# Patient Record
Sex: Female | Born: 1955 | Race: White | Hispanic: No | Smoking: Former smoker
Health system: Southern US, Community
[De-identification: ages and names within clinical notes are randomized; demographics above are authoritative.]

## PROBLEM LIST (undated history)

## (undated) DIAGNOSIS — F419 Anxiety disorder, unspecified: Secondary | ICD-10-CM

## (undated) DIAGNOSIS — F32A Depression, unspecified: Secondary | ICD-10-CM

## (undated) DIAGNOSIS — G8929 Other chronic pain: Secondary | ICD-10-CM

## (undated) DIAGNOSIS — I10 Essential (primary) hypertension: Secondary | ICD-10-CM

## (undated) DIAGNOSIS — Z87442 Personal history of urinary calculi: Secondary | ICD-10-CM

## (undated) DIAGNOSIS — M1990 Unspecified osteoarthritis, unspecified site: Secondary | ICD-10-CM

## (undated) DIAGNOSIS — R519 Headache, unspecified: Secondary | ICD-10-CM

## (undated) DIAGNOSIS — J45909 Unspecified asthma, uncomplicated: Secondary | ICD-10-CM

## (undated) DIAGNOSIS — B1920 Unspecified viral hepatitis C without hepatic coma: Secondary | ICD-10-CM

## (undated) DIAGNOSIS — F329 Major depressive disorder, single episode, unspecified: Secondary | ICD-10-CM

## (undated) HISTORY — PX: OTHER SURGICAL HISTORY: SHX169

## (undated) HISTORY — DX: Unspecified asthma, uncomplicated: J45.909

## (undated) HISTORY — DX: Headache, unspecified: R51.9

## (undated) HISTORY — DX: Unspecified viral hepatitis C without hepatic coma: B19.20

## (undated) HISTORY — DX: Other chronic pain: G89.29

---
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
## 1993-04-13 HISTORY — PX: ABDOMINAL HYSTERECTOMY: SHX81

## 1998-04-13 HISTORY — PX: TONSILLECTOMY: SUR1361

## 2000-03-11 ENCOUNTER — Encounter: Payer: Self-pay | Admitting: Internal Medicine

## 2001-11-11 ENCOUNTER — Ambulatory Visit (HOSPITAL_COMMUNITY): Admission: RE | Admit: 2001-11-11 | Discharge: 2001-11-11 | Payer: Self-pay | Admitting: Pulmonary Disease

## 2002-04-10 ENCOUNTER — Ambulatory Visit (HOSPITAL_COMMUNITY): Admission: RE | Admit: 2002-04-10 | Discharge: 2002-04-10 | Payer: Self-pay | Admitting: Pulmonary Disease

## 2004-09-04 ENCOUNTER — Ambulatory Visit (HOSPITAL_COMMUNITY): Admission: RE | Admit: 2004-09-04 | Discharge: 2004-09-04 | Payer: Self-pay | Admitting: Pulmonary Disease

## 2004-11-03 ENCOUNTER — Ambulatory Visit (HOSPITAL_COMMUNITY): Admission: RE | Admit: 2004-11-03 | Discharge: 2004-11-03 | Payer: Self-pay | Admitting: Pulmonary Disease

## 2007-02-02 ENCOUNTER — Ambulatory Visit (HOSPITAL_COMMUNITY): Admission: RE | Admit: 2007-02-02 | Discharge: 2007-02-02 | Payer: Self-pay | Admitting: Urology

## 2007-02-03 ENCOUNTER — Ambulatory Visit (HOSPITAL_COMMUNITY): Admission: RE | Admit: 2007-02-03 | Discharge: 2007-02-03 | Payer: Self-pay | Admitting: Urology

## 2009-05-13 ENCOUNTER — Ambulatory Visit (HOSPITAL_COMMUNITY): Admission: RE | Admit: 2009-05-13 | Discharge: 2009-05-13 | Payer: Self-pay | Admitting: Urology

## 2009-06-12 ENCOUNTER — Ambulatory Visit (HOSPITAL_COMMUNITY): Admission: RE | Admit: 2009-06-12 | Discharge: 2009-06-12 | Payer: Self-pay | Admitting: Urology

## 2009-08-08 ENCOUNTER — Ambulatory Visit (HOSPITAL_COMMUNITY): Admission: RE | Admit: 2009-08-08 | Discharge: 2009-08-08 | Payer: Self-pay | Admitting: Urology

## 2010-06-04 ENCOUNTER — Other Ambulatory Visit (HOSPITAL_COMMUNITY): Payer: Self-pay | Admitting: Orthopaedic Surgery

## 2010-06-04 DIAGNOSIS — M542 Cervicalgia: Secondary | ICD-10-CM

## 2010-06-04 DIAGNOSIS — M5410 Radiculopathy, site unspecified: Secondary | ICD-10-CM

## 2010-06-06 ENCOUNTER — Ambulatory Visit (HOSPITAL_COMMUNITY)
Admission: RE | Admit: 2010-06-06 | Discharge: 2010-06-06 | Disposition: A | Discharge status: Home / Self Care | Payer: BC Managed Care – PPO | Source: Ambulatory Visit | Attending: Orthopaedic Surgery | Admitting: Orthopaedic Surgery

## 2010-06-06 DIAGNOSIS — M5410 Radiculopathy, site unspecified: Secondary | ICD-10-CM

## 2010-06-06 DIAGNOSIS — M4802 Spinal stenosis, cervical region: Secondary | ICD-10-CM | POA: Insufficient documentation

## 2010-06-06 DIAGNOSIS — M5020 Other cervical disc displacement, unspecified cervical region: Secondary | ICD-10-CM | POA: Insufficient documentation

## 2010-06-06 DIAGNOSIS — M542 Cervicalgia: Secondary | ICD-10-CM

## 2010-06-06 DIAGNOSIS — R209 Unspecified disturbances of skin sensation: Secondary | ICD-10-CM | POA: Insufficient documentation

## 2010-06-06 DIAGNOSIS — R609 Edema, unspecified: Secondary | ICD-10-CM | POA: Insufficient documentation

## 2010-06-06 DIAGNOSIS — M5412 Radiculopathy, cervical region: Secondary | ICD-10-CM | POA: Insufficient documentation

## 2010-06-06 DIAGNOSIS — M47812 Spondylosis without myelopathy or radiculopathy, cervical region: Secondary | ICD-10-CM | POA: Insufficient documentation

## 2010-06-06 DIAGNOSIS — M79609 Pain in unspecified limb: Secondary | ICD-10-CM | POA: Insufficient documentation

## 2010-08-26 NOTE — H&P (Signed)
NAME:  Amber Peterson, Amber Peterson                ACCOUNT NO.:  [id number]   MEDICAL RECORD NO.:  [id number]          PATIENT TYPE:  AMB   LOCATION:  DAY                           FACILITY:  [facility]   PHYSICIAN:  Dennie Maizes, M.D.   DATE OF BIRTH:  03-26-1956   DATE OF ADMISSION:  02/02/2007  DATE OF DISCHARGE:  [facility]                              HISTORY & PHYSICAL   CHIEF COMPLAINT:  Left flank pain, nausea and vomiting, left renal  calculus.   HISTORY OF PRESENT ILLNESS:  This 55 year old female has a past history  of recurrent urolithiasis.  The patient has undergone lithotripsy of  kidney stone about 2 years ago.   She experienced significant left flank and left quadrant abdominal pain  as well as nausea and vomiting about a week ago.  She went to the  emergency room at [facility].  Evaluation was done with  a noncontrast CT scan of the abdomen and pelvis, which revealed multiple  small right renal calculi, without obstruction.  She had 2 stones in the  left  renal pelvis.  The patient is unable to pass the stones.  She  continues to have pain.  She is brought to the short stay center today  for extracorporeal shock wave lithotripsy to the left renal calculus.   PAST MEDICAL HISTORY:  1. Left renal lithiasis, status post ESL.  2. History of bronchial asthma.  3. Status post hysterectomy.  4. Status post tonsillectomy and adenoidectomy.   MEDICATIONS:  Advair Diskus inhaler, Albuterol inhaler, Xanax, Proventil  inhaler, Effexor.   ALLERGIES:  1. SULFA.  2. CODEINE.  3. PENICILLIN.  4. ASPIRIN.   PHYSICAL EXAMINATION:  HEENT:  Normal.  NECK:  No masses.  LUNGS:  Clear to auscultation.  HEART:  Regular rate and rhythm.  No murmurs.  ABDOMEN:  Soft.  No palpable flank mass or CVA tenderness.  Bladder is  not palpable. No suprapubic tenderness.   IMPRESSION:  Left renal calculi with obstruction, left flank pain.   PLAN:  ESL of left renal calculi x2, with IV sedation in  short stay  center.  I have discussed with the patient regarding the diagnosis,  operative details, alternative treatments, the outcome, possible risks,  and complications, and she has agreed for the procedure to be done.      Dennie Maizes, M.D.  Electronically Signed     SK/MEDQ  D:  02/02/2007  T:  02/02/2007  Job:  [id number]   cc:   [facility]  Fax: [phone]   Oneal Deputy. Juanetta Gosling, M.D.  Fax: [phone]

## 2015-07-23 ENCOUNTER — Other Ambulatory Visit (HOSPITAL_COMMUNITY): Payer: Self-pay | Admitting: Pulmonary Disease

## 2015-07-23 DIAGNOSIS — M542 Cervicalgia: Secondary | ICD-10-CM

## 2015-07-26 ENCOUNTER — Ambulatory Visit (HOSPITAL_COMMUNITY): Payer: BLUE CROSS/BLUE SHIELD

## 2015-07-29 ENCOUNTER — Ambulatory Visit (HOSPITAL_COMMUNITY)
Admission: RE | Admit: 2015-07-29 | Discharge: 2015-07-29 | Disposition: A | Discharge status: Home / Self Care | Payer: BLUE CROSS/BLUE SHIELD | Source: Ambulatory Visit | Attending: Pulmonary Disease | Admitting: Pulmonary Disease

## 2015-07-29 DIAGNOSIS — M4802 Spinal stenosis, cervical region: Secondary | ICD-10-CM | POA: Diagnosis not present

## 2015-07-29 DIAGNOSIS — M542 Cervicalgia: Secondary | ICD-10-CM | POA: Diagnosis present

## 2015-08-02 ENCOUNTER — Other Ambulatory Visit: Payer: Self-pay | Admitting: Pulmonary Disease

## 2015-08-02 DIAGNOSIS — G8929 Other chronic pain: Secondary | ICD-10-CM

## 2015-08-02 DIAGNOSIS — M542 Cervicalgia: Principal | ICD-10-CM

## 2015-08-09 ENCOUNTER — Ambulatory Visit
Admission: RE | Admit: 2015-08-09 | Discharge: 2015-08-09 | Disposition: A | Discharge status: Home / Self Care | Payer: BLUE CROSS/BLUE SHIELD | Source: Ambulatory Visit | Attending: Pulmonary Disease | Admitting: Pulmonary Disease

## 2015-08-09 DIAGNOSIS — M542 Cervicalgia: Principal | ICD-10-CM

## 2015-08-09 DIAGNOSIS — G8929 Other chronic pain: Secondary | ICD-10-CM

## 2015-08-09 MED ORDER — IOHEXOL 300 MG/ML  SOLN
1.0000 mL | Freq: Once | INTRAMUSCULAR | Status: AC | PRN
  Administered 2015-08-09: 1 mL via EPIDURAL

## 2015-08-09 MED ORDER — TRIAMCINOLONE ACETONIDE 40 MG/ML IJ SUSP (RADIOLOGY)
60.0000 mg | Freq: Once | INTRAMUSCULAR | Status: AC
  Administered 2015-08-09: 60 mg via EPIDURAL

## 2015-08-09 NOTE — Discharge Instructions (Signed)

## 2016-05-26 ENCOUNTER — Ambulatory Visit (HOSPITAL_COMMUNITY)
Admission: RE | Admit: 2016-05-26 | Discharge: 2016-05-26 | Disposition: A | Discharge status: Home / Self Care | Payer: BLUE CROSS/BLUE SHIELD | Source: Ambulatory Visit | Attending: Pulmonary Disease | Admitting: Pulmonary Disease

## 2016-05-26 ENCOUNTER — Other Ambulatory Visit (HOSPITAL_COMMUNITY): Payer: Self-pay | Admitting: Pulmonary Disease

## 2016-05-26 DIAGNOSIS — M545 Low back pain: Secondary | ICD-10-CM | POA: Insufficient documentation

## 2016-05-26 DIAGNOSIS — R52 Pain, unspecified: Secondary | ICD-10-CM

## 2016-06-09 ENCOUNTER — Other Ambulatory Visit (HOSPITAL_COMMUNITY): Payer: Self-pay | Admitting: Pulmonary Disease

## 2016-06-09 DIAGNOSIS — I700 Atherosclerosis of aorta: Secondary | ICD-10-CM

## 2016-06-18 ENCOUNTER — Ambulatory Visit (HOSPITAL_COMMUNITY)
Admission: RE | Admit: 2016-06-18 | Discharge: 2016-06-18 | Disposition: A | Discharge status: Home / Self Care | Payer: BLUE CROSS/BLUE SHIELD | Source: Ambulatory Visit | Attending: Pulmonary Disease | Admitting: Pulmonary Disease

## 2016-06-18 DIAGNOSIS — I700 Atherosclerosis of aorta: Secondary | ICD-10-CM | POA: Diagnosis present

## 2017-07-22 ENCOUNTER — Ambulatory Visit (INDEPENDENT_AMBULATORY_CARE_PROVIDER_SITE_OTHER): Payer: BLUE CROSS/BLUE SHIELD | Admitting: Internal Medicine

## 2017-07-22 ENCOUNTER — Encounter (INDEPENDENT_AMBULATORY_CARE_PROVIDER_SITE_OTHER): Payer: Self-pay | Admitting: Internal Medicine

## 2017-07-22 ENCOUNTER — Encounter (INDEPENDENT_AMBULATORY_CARE_PROVIDER_SITE_OTHER): Payer: Self-pay | Admitting: *Deleted

## 2017-07-22 VITALS — BP 140/90 | HR 68 | Temp 98.1°F | Ht 59.0 in | Wt 147.7 lb

## 2017-07-22 DIAGNOSIS — B1920 Unspecified viral hepatitis C without hepatic coma: Secondary | ICD-10-CM

## 2017-07-22 DIAGNOSIS — J45909 Unspecified asthma, uncomplicated: Secondary | ICD-10-CM | POA: Insufficient documentation

## 2017-07-22 DIAGNOSIS — B1710 Acute hepatitis C without hepatic coma: Secondary | ICD-10-CM

## 2017-07-22 HISTORY — DX: Unspecified viral hepatitis C without hepatic coma: B19.20

## 2017-07-22 HISTORY — DX: Unspecified asthma, uncomplicated: J45.909

## 2017-07-22 NOTE — Progress Notes (Addendum)
   Subjective:    Patient ID: Amber Peterson, female    DOB: 1956/01/06, 62 y.o.   MRN: [id number]  HPI Referred by Dr. Luan Pulling for Hepatitis C. She says she was diagnosed years ago by Dr. Gala Romney. She was not treated at that time. No IV drug use. No blood transfusion. She does not have any tattoos. She denies prior hx of jaundice.  Has received Hepatitis B vaccine years ago. She denies any risk factors for Hepatitis C.  Her appetite is good. No weight loss. Has a BM x 2 a day. Underwent a liver biopsy in 2001 for Hepatitis C which revealed changes consistent with Hepatitis C infection.     06/29/2017 [place] quaint [id number].   Review of Systems Past Medical History:  Diagnosis Date  . Asthma 07/22/2017  . Hepatitis C 07/22/2017      Allergies  Allergen Reactions  . Penicillins     Hives,itching  . Sulfa Antibiotics     Hives, itching    Current Outpatient Medications on File Prior to Visit  Medication Sig Dispense Refill  . albuterol (PROVENTIL) (2.5 MG/3ML) 0.083% nebulizer solution Take 2.5 mg by nebulization every 6 (six) hours as needed for wheezing or shortness of breath.    . ALPRAZolam (XANAX) 0.5 MG tablet Take 0.5 mg by mouth at bedtime as needed for anxiety.    . budesonide-formoterol (SYMBICORT) 160-4.5 MCG/ACT inhaler Inhale 2 puffs into the lungs 2 (two) times daily.    Marland Kitchen HYDROcodone-acetaminophen (NORCO) 7.5-325 MG tablet Take 1 tablet by mouth every 6 (six) hours as needed for moderate pain.    . magnesium 30 MG tablet Take by mouth.    . montelukast (SINGULAIR) 10 MG tablet Take 10 mg by mouth at bedtime.    . nebivolol (BYSTOLIC) 10 MG tablet Take 10 mg by mouth daily.    Marland Kitchen venlafaxine (EFFEXOR) 50 MG tablet Take 50 mg by mouth 2 (two) times daily.     No current facility-administered medications on file prior to visit.         Objective:   Physical Exam  Blood pressure 140/90, pulse 68, temperature 98.1 F (36.7 C), height 5\' 9"  (1.753 m), weight 147 lb  11.2 oz (67 kg). Alert and oriented. Skin warm and dry. Oral mucosa is moist.   . Sclera anicteric, conjunctivae is pink. Thyroid not enlarged. No cervical lymphadenopathy. Lungs clear. Heart regular rate and rhythm.  Abdomen is soft. Bowel sounds are positive. No hepatomegaly. No abdominal masses felt. No tenderness.  No edema to lower extremities.          Assessment & Plan:  Hepatitis C. Am going to get a CBC, Heptic, PT/INR, AFP, Acute hepatitis panel, HIV, Hep C genotype, Urine drug screen. US abdomen elast.

## 2017-07-22 NOTE — Patient Instructions (Signed)
Labs today    Further recommendations to follow

## 2017-07-27 ENCOUNTER — Other Ambulatory Visit (INDEPENDENT_AMBULATORY_CARE_PROVIDER_SITE_OTHER): Payer: Self-pay | Admitting: Internal Medicine

## 2017-07-27 ENCOUNTER — Ambulatory Visit (HOSPITAL_COMMUNITY)
Admission: RE | Admit: 2017-07-27 | Discharge: 2017-07-27 | Disposition: A | Discharge status: Home / Self Care | Payer: BLUE CROSS/BLUE SHIELD | Source: Ambulatory Visit | Attending: Internal Medicine | Admitting: Internal Medicine

## 2017-07-27 ENCOUNTER — Telehealth (INDEPENDENT_AMBULATORY_CARE_PROVIDER_SITE_OTHER): Payer: Self-pay | Admitting: Internal Medicine

## 2017-07-27 DIAGNOSIS — K8020 Calculus of gallbladder without cholecystitis without obstruction: Secondary | ICD-10-CM | POA: Insufficient documentation

## 2017-07-27 DIAGNOSIS — B1710 Acute hepatitis C without hepatic coma: Secondary | ICD-10-CM | POA: Insufficient documentation

## 2017-07-27 DIAGNOSIS — R161 Splenomegaly, not elsewhere classified: Secondary | ICD-10-CM | POA: Diagnosis not present

## 2017-07-27 LAB — HEPATIC FUNCTION PANEL
AG Ratio: 1.2 (calc) (ref 1.0–2.5)
ALT: 71 U/L — ABNORMAL HIGH (ref 6–29)
AST: 54 U/L — ABNORMAL HIGH (ref 10–35)
Albumin: 4.3 g/dL (ref 3.6–5.1)
Alkaline phosphatase (APISO): 72 U/L (ref 33–130)
Bilirubin, Direct: 0.3 mg/dL — ABNORMAL HIGH (ref 0.0–0.2)
Globulin: 3.5 g/dL (ref 1.9–3.7)
Indirect Bilirubin: 0.9 mg/dL (ref 0.2–1.2)
Total Bilirubin: 1.2 mg/dL (ref 0.2–1.2)
Total Protein: 7.8 g/dL (ref 6.1–8.1)

## 2017-07-27 LAB — DRUGS OF ABUSE SCREEN W/O ALC, ROUTINE URINE
AMPHETAMINES (1000 ng/mL SCRN): NEGATIVE
BARBITURATES: NEGATIVE
BENZODIAZEPINES: POSITIVE — AB
COCAINE METABOLITES: NEGATIVE
HYDROCODONE: POSITIVE — AB
HYDROMORPHONE: POSITIVE — AB
MARIJUANA MET (50 ng/mL SCRN): NEGATIVE
METHADONE: NEGATIVE
METHAQUALONE: NEGATIVE
OPIATES: POSITIVE — AB
PHENCYCLIDINE: NEGATIVE
PROPOXYPHENE: NEGATIVE

## 2017-07-27 LAB — CBC WITH DIFFERENTIAL/PLATELET
Basophils Absolute: 40 {cells}/uL (ref 0–200)
Basophils Relative: 0.6 %
Eosinophils Absolute: 147 {cells}/uL (ref 15–500)
Eosinophils Relative: 2.2 %
HCT: 42.2 % (ref 35.0–45.0)
Hemoglobin: 15 g/dL (ref 11.7–15.5)
Lymphs Abs: 2057 {cells}/uL (ref 850–3900)
MCH: 35 pg — ABNORMAL HIGH (ref 27.0–33.0)
MCHC: 35.5 g/dL (ref 32.0–36.0)
MCV: 98.4 fL (ref 80.0–100.0)
MPV: 11.3 fL (ref 7.5–12.5)
Monocytes Relative: 8.7 %
Neutro Abs: 3873 {cells}/uL (ref 1500–7800)
Neutrophils Relative %: 57.8 %
Platelets: 94 Thousand/uL — ABNORMAL LOW (ref 140–400)
RBC: 4.29 Million/uL (ref 3.80–5.10)
RDW: 13.1 % (ref 11.0–15.0)
Total Lymphocyte: 30.7 %
WBC mixed population: 583 {cells}/uL (ref 200–950)
WBC: 6.7 Thousand/uL (ref 3.8–10.8)

## 2017-07-27 LAB — HIV ANTIBODY (ROUTINE TESTING W REFLEX): HIV: NONREACTIVE

## 2017-07-27 LAB — HEPATITIS PANEL, ACUTE
HEP B C IGM: NONREACTIVE
Hep A IgM: NONREACTIVE
Hepatitis B Surface Ag: NONREACTIVE
Hepatitis C Ab: REACTIVE — AB
SIGNAL TO CUT-OFF: 25 — ABNORMAL HIGH (ref <1.00)

## 2017-07-27 LAB — HCV RNA,QUANTITATIVE REAL TIME PCR
HCV Quantitative Log: 6.45 {Log_IU}/mL — ABNORMAL HIGH
HCV RNA, PCR, QN: 2840000 [IU]/mL — ABNORMAL HIGH

## 2017-07-27 LAB — AFP TUMOR MARKER: AFP-Tumor Marker: 4.1 ng/mL

## 2017-07-27 LAB — PROTIME-INR
INR: 1.1
Prothrombin Time: 11.3 s (ref 9.0–11.5)

## 2017-07-27 LAB — HEPATITIS C GENOTYPE

## 2017-07-27 NOTE — Telephone Encounter (Signed)
Results have been given to patient. Repeat Urine drug screen

## 2017-07-31 LAB — DRUGS OF ABUSE SCREEN W/O ALC, ROUTINE URINE
AMPHETAMINES (1000 NG/ML SCRN): NEGATIVE
BARBITURATES: NEGATIVE
BENZODIAZEPINES: POSITIVE — AB
COCAINE METABOLITES: NEGATIVE
HYDROCODONE: POSITIVE — AB
HYDROMORPHONE: POSITIVE — AB
MARIJUANA MET (50 NG/ML SCRN): NEGATIVE
METHADONE: NEGATIVE
METHAQUALONE: NEGATIVE
OPIATES: POSITIVE — AB
PHENCYCLIDINE: NEGATIVE
PROPOXYPHENE: NEGATIVE

## 2017-08-03 ENCOUNTER — Telehealth (INDEPENDENT_AMBULATORY_CARE_PROVIDER_SITE_OTHER): Payer: Self-pay | Admitting: Internal Medicine

## 2017-08-03 NOTE — Telephone Encounter (Signed)
Amber Peterson, [place]avy[place] x 8 weeks.  

## 2017-08-05 NOTE — Telephone Encounter (Signed)
The PA process has been sent to [employer] ,once we have rec'd approval from them we will contact the patient.

## 2017-08-06 ENCOUNTER — Telehealth (INDEPENDENT_AMBULATORY_CARE_PROVIDER_SITE_OTHER): Payer: Self-pay | Admitting: Internal Medicine

## 2017-08-06 NOTE — Telephone Encounter (Signed)
I am not sure what this would be. We have started the PA process for her Hep C treatment. Forwarded to Terri to address on 08/09/2017.

## 2017-08-06 NOTE — Telephone Encounter (Signed)
Patient called back, stated the medication is [employer]

## 2017-08-06 NOTE — Telephone Encounter (Signed)
Patient left message stating Amber Peterson was to refill a medication - stated she called pharmacy and they had not received anything.

## 2017-08-09 ENCOUNTER — Telehealth (INDEPENDENT_AMBULATORY_CARE_PROVIDER_SITE_OTHER): Payer: Self-pay | Admitting: Internal Medicine

## 2017-08-09 NOTE — Telephone Encounter (Signed)
I have spoken with patient 

## 2017-08-09 NOTE — Telephone Encounter (Signed)
Advised her that it will take a couple of weeks for the medication to come in and that we would call her.

## 2017-08-09 NOTE — Telephone Encounter (Signed)
Patient would like for you to call her at [phone]

## 2017-08-16 ENCOUNTER — Telehealth (INDEPENDENT_AMBULATORY_CARE_PROVIDER_SITE_OTHER): Payer: Self-pay | Admitting: Internal Medicine

## 2017-08-16 NOTE — Telephone Encounter (Signed)
Her insurance will pay for [employer]

## 2017-08-16 NOTE — Telephone Encounter (Signed)
Patient called stated [facility] - denied her medication - please call her at [phone]

## 2017-08-16 NOTE — Telephone Encounter (Signed)
A Rx for Harvoni has been called to [facility]. Patient is to take 1 a day for 12 weeks. #28 with 2 refills. Patient was called and message was left for the patient.

## 2017-08-26 ENCOUNTER — Telehealth (INDEPENDENT_AMBULATORY_CARE_PROVIDER_SITE_OTHER): Payer: Self-pay | Admitting: Internal Medicine

## 2017-08-26 NOTE — Telephone Encounter (Signed)
Patient called left message wanting to know about her treatment - please call [phone]

## 2017-08-26 NOTE — Telephone Encounter (Signed)
Left message telling her we would call her once we have her medication approved.

## 2017-09-07 ENCOUNTER — Telehealth (INDEPENDENT_AMBULATORY_CARE_PROVIDER_SITE_OTHER): Payer: Self-pay | Admitting: Internal Medicine

## 2017-09-07 DIAGNOSIS — B1920 Unspecified viral hepatitis C without hepatic coma: Secondary | ICD-10-CM

## 2017-09-07 MED ORDER — LEDIPASVIR-SOFOSBUVIR 90-400 MG PO TABS: 1.0000 | ORAL_TABLET | Freq: Every day | ORAL | 3 refills | Status: DC

## 2017-09-07 NOTE — Telephone Encounter (Signed)
Rx for [employer]

## 2017-09-29 ENCOUNTER — Telehealth (INDEPENDENT_AMBULATORY_CARE_PROVIDER_SITE_OTHER): Payer: Self-pay | Admitting: *Deleted

## 2017-09-29 NOTE — Telephone Encounter (Signed)
Patient was called and [place] before she can pick up the medication.

## 2017-10-06 ENCOUNTER — Encounter (INDEPENDENT_AMBULATORY_CARE_PROVIDER_SITE_OTHER): Payer: Self-pay | Admitting: *Deleted

## 2017-10-06 ENCOUNTER — Other Ambulatory Visit (INDEPENDENT_AMBULATORY_CARE_PROVIDER_SITE_OTHER): Payer: Self-pay | Admitting: *Deleted

## 2017-10-06 ENCOUNTER — Telehealth (INDEPENDENT_AMBULATORY_CARE_PROVIDER_SITE_OTHER): Payer: Self-pay | Admitting: Internal Medicine

## 2017-10-06 DIAGNOSIS — B1920 Unspecified viral hepatitis C without hepatic coma: Secondary | ICD-10-CM

## 2017-10-06 NOTE — Telephone Encounter (Signed)
Needs CBC, Hepatic and Hep C quaint in 4 weeks. Please send letter. Thanks

## 2017-10-06 NOTE — Telephone Encounter (Signed)
Labs are noted and a letter has been sent.

## 2017-10-21 ENCOUNTER — Telehealth (INDEPENDENT_AMBULATORY_CARE_PROVIDER_SITE_OTHER): Payer: Self-pay | Admitting: Internal Medicine

## 2017-10-21 LAB — CBC
HEMATOCRIT: 43.8 % (ref 35.0–45.0)
Hemoglobin: 15.5 g/dL (ref 11.7–15.5)
MCH: 34.8 pg — AB (ref 27.0–33.0)
MCHC: 35.4 g/dL (ref 32.0–36.0)
MCV: 98.2 fL (ref 80.0–100.0)
MPV: 11 fL (ref 7.5–12.5)
PLATELETS: 111 10*3/uL — AB (ref 140–400)
RBC: 4.46 10*6/uL (ref 3.80–5.10)
RDW: 13.1 % (ref 11.0–15.0)
WBC: 8.3 10*3/uL (ref 3.8–10.8)

## 2017-10-21 LAB — HEPATIC FUNCTION PANEL
AG Ratio: 1.1 (calc) (ref 1.0–2.5)
ALT: 21 U/L (ref 6–29)
AST: 25 U/L (ref 10–35)
Albumin: 4.4 g/dL (ref 3.6–5.1)
Alkaline phosphatase (APISO): 62 U/L (ref 33–130)
Bilirubin, Direct: 0.3 mg/dL — ABNORMAL HIGH (ref 0.0–0.2)
Globulin: 3.9 g/dL — ABNORMAL HIGH (ref 1.9–3.7)
Indirect Bilirubin: 1.2 mg/dL (ref 0.2–1.2)
Total Bilirubin: 1.5 mg/dL — ABNORMAL HIGH (ref 0.2–1.2)
Total Protein: 8.3 g/dL — ABNORMAL HIGH (ref 6.1–8.1)

## 2017-10-21 LAB — HEPATITIS C RNA QUANTITATIVE
HCV Quantitative Log: <1.18 Log IU/mL — AB
HCV RNA, PCR, QN: DETECTED [IU]/mL — AB

## 2017-10-21 NOTE — Telephone Encounter (Signed)
Hep C quaint in 2 weeks

## 2017-10-25 ENCOUNTER — Other Ambulatory Visit (INDEPENDENT_AMBULATORY_CARE_PROVIDER_SITE_OTHER): Payer: Self-pay | Admitting: *Deleted

## 2017-10-25 ENCOUNTER — Encounter (INDEPENDENT_AMBULATORY_CARE_PROVIDER_SITE_OTHER): Payer: Self-pay | Admitting: *Deleted

## 2017-10-25 DIAGNOSIS — B182 Chronic viral hepatitis C: Secondary | ICD-10-CM

## 2017-10-25 NOTE — Telephone Encounter (Signed)
Lab has been noted. Patient will be sent a message as a reminder.

## 2017-11-04 ENCOUNTER — Telehealth (INDEPENDENT_AMBULATORY_CARE_PROVIDER_SITE_OTHER): Payer: Self-pay | Admitting: Internal Medicine

## 2017-11-04 NOTE — Telephone Encounter (Signed)
Patient came by office before going to lab - patient stated her insurance needs authorization for next dose of medication

## 2017-11-05 NOTE — Telephone Encounter (Signed)
We have rec'd the PA from Amber Peterson. Once it is completed and we here a response from them , we will let the patient know their answer.

## 2017-11-05 NOTE — Telephone Encounter (Signed)
Will review with Raeanne Gathers.

## 2017-11-08 LAB — HEPATITIS C RNA QUANTITATIVE
HCV Quantitative Log: <1.18 Log IU/mL
HCV RNA, PCR, QN: <15 IU/mL

## 2017-11-24 ENCOUNTER — Telehealth (INDEPENDENT_AMBULATORY_CARE_PROVIDER_SITE_OTHER): Payer: Self-pay | Admitting: Internal Medicine

## 2017-11-24 NOTE — Telephone Encounter (Signed)
Patient called wanted to know about the remainder of her medication process - please call [phone]

## 2017-11-26 NOTE — Telephone Encounter (Signed)
Amber Peterson - This is the one they only approved for the 8 weeks of treatment , correct?

## 2017-11-29 ENCOUNTER — Ambulatory Visit (INDEPENDENT_AMBULATORY_CARE_PROVIDER_SITE_OTHER): Payer: BLUE CROSS/BLUE SHIELD | Admitting: Internal Medicine

## 2017-11-30 ENCOUNTER — Encounter (INDEPENDENT_AMBULATORY_CARE_PROVIDER_SITE_OTHER): Payer: Self-pay | Admitting: Internal Medicine

## 2017-11-30 ENCOUNTER — Ambulatory Visit (INDEPENDENT_AMBULATORY_CARE_PROVIDER_SITE_OTHER): Payer: BLUE CROSS/BLUE SHIELD | Admitting: Internal Medicine

## 2017-11-30 VITALS — BP 150/80 | HR 68 | Temp 98.1°F | Ht 59.0 in | Wt 149.4 lb

## 2017-11-30 DIAGNOSIS — B182 Chronic viral hepatitis C: Secondary | ICD-10-CM

## 2017-11-30 NOTE — Telephone Encounter (Signed)
Terri states this morning that the patient is coming in this morning to see her. She will advise the patient that Insurance only covered 8 weeks.

## 2017-11-30 NOTE — Patient Instructions (Addendum)
Hepatic function.  OV in 1 year.

## 2017-11-30 NOTE — Progress Notes (Signed)
Subjective:    Patient ID: Amber Peterson, female    DOB: 09-23-1955, 62 y.o.   MRN: [id number]  HPI Here today for f/u. Hx of Hepatitis C.  She cleared the virus 11/04/2017. Treat with Harvoni x 8 weeks. Had no side effects from the medication.  She tells me she is doing good. Her appetite is okay. She has gained 2 pounds from her last visit. She sees her daughter often. She is exercising with yoga and has a max machine. She exercises abot 3-4 times a week and walks.  She has no GI complaints.   07/28/18-19 [place] Elast: F3-F4 Genotype 1B.   CBC    Component Value Date/Time   WBC 8.3 10/19/2017 1601   RBC 4.46 10/19/2017 1601   HGB 15.5 10/19/2017 1601   HCT 43.8 10/19/2017 1601   PLT 111 (L) 10/19/2017 1601   MCV 98.2 10/19/2017 1601   MCH 34.8 (H) 10/19/2017 1601   MCHC 35.4 10/19/2017 1601   RDW 13.1 10/19/2017 1601   LYMPHSABS 2,057 07/22/2017 1023   EOSABS 147 07/22/2017 1023   BASOSABS 40 07/22/2017 1023   Hepatic Function Latest Ref Rng & Units 10/19/2017 07/22/2017  Total Protein 6.1 - 8.1 g/dL 8.3(H) 7.8  AST 10 - 35 U/L 25 54(H)  ALT 6 - 29 U/L 21 71(H)  Total Bilirubin 0.2 - 1.2 mg/dL 1.5(H) 1.2  Bilirubin, Direct 0.0 - 0.2 mg/dL 0.3(H) 0.3(H)    Review of Systems Past Medical History:  Diagnosis Date  . Asthma 07/22/2017  . Hepatitis C 07/22/2017      Allergies  Allergen Reactions  . Penicillins     Hives,itching  . Sulfa Antibiotics     Hives, itching    Current Outpatient Medications on File Prior to Visit  Medication Sig Dispense Refill  . albuterol (PROVENTIL) (2.5 MG/3ML) 0.083% nebulizer solution Take 2.5 mg by nebulization every 6 (six) hours as needed for wheezing or shortness of breath.    . ALPRAZolam (XANAX) 0.5 MG tablet Take 0.5 mg by mouth at bedtime as needed for anxiety.    . budesonide-formoterol (SYMBICORT) 160-4.5 MCG/ACT inhaler Inhale 2 puffs into the lungs 2 (two) times daily.    Marland Kitchen HYDROcodone-acetaminophen (NORCO) 7.5-325 MG  tablet Take 1 tablet by mouth every 6 (six) hours as needed for moderate pain.    . Ledipasvir-Sofosbuvir (HARVONI) 90-400 MG TABS Take 1 tablet by mouth daily. 30 tablet 3  . magnesium 30 MG tablet Take by mouth.    . montelukast (SINGULAIR) 10 MG tablet Take 10 mg by mouth at bedtime.    . nebivolol (BYSTOLIC) 10 MG tablet Take 10 mg by mouth daily.    Marland Kitchen venlafaxine (EFFEXOR) 50 MG tablet Take 50 mg by mouth 2 (two) times daily.     No current facility-administered medications on file prior to visit.         Objective:   Physical Exam Blood pressure (!) 150/80, pulse 68, temperature 98.1 F (36.7 C), height 4\' 11"  (1.499 m), weight 149 lb 6.4 oz (67.8 kg). Alert and oriented. Skin warm and dry. Oral mucosa is moist.   . Sclera anicteric, conjunctivae is pink. Thyroid not enlarged. No cervical lymphadenopathy. Lungs clear. Heart regular rate and rhythm.  Abdomen is soft. Bowel sounds are positive. No hepatomegaly. No abdominal masses felt. No tenderness.  No edema to lower extremities.           Assessment & Plan:  Hepatitis C. She has cleared the virus.  She is doing well. Will see back in 1 year. Hepatic function, Hep C quaint today.  Will get Liver biopsy report from [facility].

## 2017-11-30 NOTE — Telephone Encounter (Signed)
Treatment for 8 weeks. I discussed this with her a week ago.

## 2017-12-02 LAB — HEPATITIS C RNA QUANTITATIVE
HCV Quantitative Log: <1.18 Log IU/mL
HCV RNA, PCR, QN: NOT DETECTED [IU]/mL

## 2017-12-02 LAB — HEPATIC FUNCTION PANEL
AG Ratio: 1.1 (calc) (ref 1.0–2.5)
ALKALINE PHOSPHATASE (APISO): 63 U/L (ref 33–130)
ALT: 27 U/L (ref 6–29)
AST: 25 U/L (ref 10–35)
Albumin: 3.7 g/dL (ref 3.6–5.1)
BILIRUBIN DIRECT: 0.2 mg/dL (ref 0.0–0.2)
BILIRUBIN TOTAL: 0.7 mg/dL (ref 0.2–1.2)
Globulin: 3.3 g/dL (calc) (ref 1.9–3.7)
Indirect Bilirubin: 0.5 mg/dL (calc) (ref 0.2–1.2)
Total Protein: 7 g/dL (ref 6.1–8.1)

## 2018-01-04 ENCOUNTER — Encounter (INDEPENDENT_AMBULATORY_CARE_PROVIDER_SITE_OTHER): Payer: Self-pay | Admitting: *Deleted

## 2018-11-08 ENCOUNTER — Ambulatory Visit (HOSPITAL_COMMUNITY)
Admission: RE | Admit: 2018-11-08 | Discharge: 2018-11-08 | Disposition: A | Discharge status: Home / Self Care | Payer: BC Managed Care – PPO | Source: Ambulatory Visit | Attending: Pulmonary Disease | Admitting: Pulmonary Disease

## 2018-11-08 ENCOUNTER — Other Ambulatory Visit (HOSPITAL_COMMUNITY): Payer: Self-pay | Admitting: Pulmonary Disease

## 2018-11-08 ENCOUNTER — Other Ambulatory Visit: Payer: Self-pay

## 2018-11-08 DIAGNOSIS — M25531 Pain in right wrist: Secondary | ICD-10-CM

## 2018-12-01 ENCOUNTER — Ambulatory Visit (INDEPENDENT_AMBULATORY_CARE_PROVIDER_SITE_OTHER): Payer: BC Managed Care – PPO | Admitting: Nurse Practitioner

## 2018-12-25 NOTE — Progress Notes (Signed)
   Subjective:    Patient ID: Amber Peterson, female    DOB: [birth year], 63 y.o.   MRN: [id number]  HPI Amber Peterson is a 63 year old female with a past medical history of asthma and chronic hepatitis C GT1b treated with Harvoni x 8 weeks 09/2017 -11/2017. Previously followed by Deberah Castle, NP. Her end of treatment Hep C RNA level was undetectable 11/30/17. A repeat HCV RNA 3 months post treatment to assess for SVR was not done. She reported having muscle weakness while taking Harvoni which recently abated. Her last colonoscopy was 10 years ago which she reported was normal. No family history of colon cancer. She denies having any abdominal pain, change in bowel pattern or hematochezia. She drinks 4 alcoholic beverages yearly. She takes Hydrocodone 7.5mg /Acetaminophen 1 tab 4 times daily due to having chronic neck and lower back pain. Weight is 138.2lbs today, weight was 149lbs 11/2017.  07/27/2017 ULTRASOUND ABDOMEN: Cholelithiasis. Increased echotexture throughout the liver compatible with fatty infiltration or intrinsic liver disease. Splenomegaly.  07/27/2017: ULTRASOUND HEPATIC ELASTOGRAPHY: Median hepatic shear wave velocity is calculated at 3.09 m/sec. Corresponding Metavir fibrosis score is Some F3 + F4. Risk of fibrosis is High.  03/10/00 Liver biopsy:  Changes consistent with chronic hepatitis C   Past Medical History:  Diagnosis Date  . Asthma 07/22/2017  . Hepatitis C 07/22/2017   Past Surgical History:  Procedure Laterality Date  . complete hysterectomy    . tonsillectomy     Review of Systems see HPI, all other systems reviewed and are negative     Objective:   Physical Exam BP (!) 147/87   Pulse 74   Temp 98.4 F (36.9 C)   Ht 4\' 11"  (1.499 m)   Wt 138 lb 3.2 oz (62.7 kg)   BMI 27.91 kg/m  General: 63 year old female well-developed in no acute distress Eyes: Sclera nonicteric, conjunctiva pink Mouth: Poor dentition Neck: Supple, no lymphadenopathy or  thyromegaly Heart: Regular rate and rhythm, no murmurs Lungs: Breath sounds clear throughout Abdomen: Soft, nontender, no masses or organomegaly, lower midline abdominal scar intact Extremities: No edema Neuro: Alert and oriented x4, no focal deficits     Assessment & Plan:   1. Chronic Hepatitis C GT1b treated with Harvoni x 8 weeks 09/2017-11/2017  -CBC, hepatic panel and Hep C RNA Quant   -exercise, avoid weight gain -further follow up to be determined after the above lab results reviewed   2. Colon cancer screening -Schedule colonoscopy, benefits and risks discussed including risk with sedation, risk of bleeding, perforation and infection -Further follow-up to be determined after the above lab results and colonoscopy results received

## 2018-12-26 ENCOUNTER — Other Ambulatory Visit: Payer: Self-pay

## 2018-12-26 ENCOUNTER — Encounter (INDEPENDENT_AMBULATORY_CARE_PROVIDER_SITE_OTHER): Payer: Self-pay | Admitting: *Deleted

## 2018-12-26 ENCOUNTER — Telehealth (INDEPENDENT_AMBULATORY_CARE_PROVIDER_SITE_OTHER): Payer: Self-pay | Admitting: *Deleted

## 2018-12-26 ENCOUNTER — Ambulatory Visit (INDEPENDENT_AMBULATORY_CARE_PROVIDER_SITE_OTHER): Payer: BC Managed Care – PPO | Admitting: Nurse Practitioner

## 2018-12-26 ENCOUNTER — Encounter (INDEPENDENT_AMBULATORY_CARE_PROVIDER_SITE_OTHER): Payer: Self-pay | Admitting: Nurse Practitioner

## 2018-12-26 VITALS — BP 147/87 | HR 74 | Temp 98.4°F | Ht 59.0 in | Wt 138.2 lb

## 2018-12-26 DIAGNOSIS — Z1211 Encounter for screening for malignant neoplasm of colon: Secondary | ICD-10-CM | POA: Insufficient documentation

## 2018-12-26 DIAGNOSIS — B182 Chronic viral hepatitis C: Secondary | ICD-10-CM | POA: Diagnosis not present

## 2018-12-26 MED ORDER — SUPREP BOWEL PREP KIT 17.5-3.13-1.6 GM/177ML PO SOLN: 1.0000 | Freq: Once | ORAL | 0 refills | Status: AC

## 2018-12-26 NOTE — Telephone Encounter (Signed)
Patient needs suprep TCS sch'd 10/23

## 2018-12-26 NOTE — Patient Instructions (Signed)
1. Complete the ordered blood tests   2. Schedule a colonoscopy   3. Further follow up to be determined after labs and colonoscopy completed

## 2018-12-29 LAB — CBC WITH DIFFERENTIAL/PLATELET
Absolute Monocytes: 466 cells/uL (ref 200–950)
Basophils Absolute: 37 cells/uL (ref 0–200)
Basophils Relative: 0.7 %
Eosinophils Absolute: 180 cells/uL (ref 15–500)
Eosinophils Relative: 3.4 %
HCT: 42.6 % (ref 35.0–45.0)
Hemoglobin: 14.6 g/dL (ref 11.7–15.5)
Lymphs Abs: 2035 cells/uL (ref 850–3900)
MCH: 34 pg — ABNORMAL HIGH (ref 27.0–33.0)
MCHC: 34.3 g/dL (ref 32.0–36.0)
MCV: 99.1 fL (ref 80.0–100.0)
MPV: 10.8 fL (ref 7.5–12.5)
Monocytes Relative: 8.8 %
Neutro Abs: 2581 cells/uL (ref 1500–7800)
Neutrophils Relative %: 48.7 %
Platelets: 106 10*3/uL — ABNORMAL LOW (ref 140–400)
RBC: 4.3 10*6/uL (ref 3.80–5.10)
RDW: 12.6 % (ref 11.0–15.0)
Total Lymphocyte: 38.4 %
WBC: 5.3 10*3/uL (ref 3.8–10.8)

## 2018-12-29 LAB — COMPLETE METABOLIC PANEL WITH GFR
AG Ratio: 1.5 (calc) (ref 1.0–2.5)
ALT: 23 U/L (ref 6–29)
AST: 25 U/L (ref 10–35)
Albumin: 4.2 g/dL (ref 3.6–5.1)
Alkaline phosphatase (APISO): 64 U/L (ref 37–153)
BUN: 7 mg/dL (ref 7–25)
CO2: 31 mmol/L (ref 20–32)
Calcium: 9.1 mg/dL (ref 8.6–10.4)
Chloride: 102 mmol/L (ref 98–110)
Creat: 0.85 mg/dL (ref 0.50–0.99)
GFR, Est African American: 84 mL/min/{1.73_m2} (ref >=60)
GFR, Est Non African American: 72 mL/min/{1.73_m2} (ref >=60)
Globulin: 2.8 g/dL (calc) (ref 1.9–3.7)
Glucose, Bld: 82 mg/dL (ref 65–99)
Potassium: 4.4 mmol/L (ref 3.5–5.3)
Sodium: 139 mmol/L (ref 135–146)
Total Bilirubin: 0.8 mg/dL (ref 0.2–1.2)
Total Protein: 7 g/dL (ref 6.1–8.1)

## 2018-12-29 LAB — HEPATITIS C RNA QUANTITATIVE
HCV Quantitative Log: 1.85 Log IU/mL — ABNORMAL HIGH
HCV RNA, PCR, QN: 70 IU/mL — ABNORMAL HIGH

## 2019-01-01 ENCOUNTER — Telehealth (INDEPENDENT_AMBULATORY_CARE_PROVIDER_SITE_OTHER): Payer: Self-pay | Admitting: Nurse Practitioner

## 2019-01-01 ENCOUNTER — Other Ambulatory Visit (INDEPENDENT_AMBULATORY_CARE_PROVIDER_SITE_OTHER): Payer: Self-pay | Admitting: Nurse Practitioner

## 2019-01-01 DIAGNOSIS — B182 Chronic viral hepatitis C: Secondary | ICD-10-CM

## 2019-01-01 NOTE — Telephone Encounter (Signed)
Amber Peterson please call patient to schedule an abdominal ultrasound and elastography, orders entered n Epic. thx

## 2019-01-02 NOTE — Telephone Encounter (Signed)
[place] sch'd 01/10/19 at 830 (815), npo after midnight, patient aware

## 2019-01-05 ENCOUNTER — Other Ambulatory Visit (INDEPENDENT_AMBULATORY_CARE_PROVIDER_SITE_OTHER): Payer: Self-pay | Admitting: Nurse Practitioner

## 2019-01-05 DIAGNOSIS — B182 Chronic viral hepatitis C: Secondary | ICD-10-CM

## 2019-01-10 ENCOUNTER — Ambulatory Visit (HOSPITAL_COMMUNITY)
Admission: RE | Admit: 2019-01-10 | Discharge: 2019-01-10 | Disposition: A | Discharge status: Home / Self Care | Payer: BC Managed Care – PPO | Source: Ambulatory Visit | Attending: Nurse Practitioner | Admitting: Nurse Practitioner

## 2019-01-10 ENCOUNTER — Other Ambulatory Visit: Payer: Self-pay

## 2019-01-10 DIAGNOSIS — B182 Chronic viral hepatitis C: Secondary | ICD-10-CM | POA: Diagnosis not present

## 2019-01-26 NOTE — Patient Instructions (Signed)
Amber Peterson  01/26/2019     @PREFPERIOPPHARMACY @   Your procedure is scheduled on  02/03/2019 .  Report to [facility] at  [facility].M.  Call this number if you have problems the morning of surgery:  [phone]   Remember:  Follow the diet and prep instructions given to you by Dr Olevia Perches office.                      Take these medicines the morning of surgery with A SIP OF WATER  Hydrocodone(if needed), nebivolol, effexor. Use your nebulizer and your inhaler before you come.    Do not wear jewelry, make-up or nail polish.  Do not wear lotions, powders, or perfumes, Please wear deodorant and brush your teeth.  Do not shave 48 hours prior to surgery.  Men may shave face and neck.  Do not bring valuables to the hospital.  [facility] is not responsible for any belongings or valuables.  Contacts, dentures or bridgework may not be worn into surgery.  Leave your suitcase in the car.  After surgery it may be brought to your room.  For patients admitted to the hospital, discharge time will be determined by your treatment team.  Patients discharged the day of surgery will not be allowed to drive home.   Name and phone number of your driver:   family Special instructions:  None  Please read over the following fact sheets that you were given. Anesthesia Post-op Instructions and Care and Recovery After Surgery       Colonoscopy, Adult, Care After This sheet gives you information about how to care for yourself after your procedure. Your health care provider may also give you more specific instructions. If you have problems or questions, contact your health care provider. What can I expect after the procedure? After the procedure, it is common to have:  A small amount of blood in your stool for 24 hours after the procedure.  Some gas.  Mild abdominal cramping or bloating. Follow these instructions at home: General instructions  For the first 24 hours after the  procedure: ? Do not drive or use machinery. ? Do not sign important documents. ? Do not drink alcohol. ? Do your regular daily activities at a slower pace than normal. ? Eat soft, easy-to-digest foods.  Take over-the-counter or prescription medicines only as told by your health care provider. Relieving cramping and bloating   Try walking around when you have cramps or feel bloated.  Apply heat to your abdomen as told by your health care provider. Use a heat source that your health care provider recommends, such as a moist heat pack or a heating pad. ? Place a towel between your skin and the heat source. ? Leave the heat on for 20-30 minutes. ? Remove the heat if your skin turns bright red. This is especially important if you are unable to feel pain, heat, or cold. You may have a greater risk of getting burned. Eating and drinking   Drink enough fluid to keep your urine pale yellow.  Resume your normal diet as instructed by your health care provider. Avoid heavy or fried foods that are hard to digest.  Avoid drinking alcohol for as long as instructed by your health care provider. Contact a health care provider if:  You have blood in your stool 2-3 days after the procedure. Get help right away if:  You have more than  a small spotting of blood in your stool.  You pass large blood clots in your stool.  Your abdomen is swollen.  You have nausea or vomiting.  You have a fever.  You have increasing abdominal pain that is not relieved with medicine. Summary  After the procedure, it is common to have a small amount of blood in your stool. You may also have mild abdominal cramping and bloating.  For the first 24 hours after the procedure, do not drive or use machinery, sign important documents, or drink alcohol.  Contact your health care provider if you have a lot of blood in your stool, nausea or vomiting, a fever, or increased abdominal pain. This information is not intended  to replace advice given to you by your health care provider. Make sure you discuss any questions you have with your health care provider. Document Released: 11/12/2003 Document Revised: 01/20/2017 Document Reviewed: 06/11/2015 Elsevier Patient Education  2020 [place] After These instructions provide you with information about caring for yourself after your procedure. Your health care provider may also give you more specific instructions. Your treatment has been planned according to current medical practices, but problems sometimes occur. Call your health care provider if you have any problems or questions after your procedure. What can I expect after the procedure? After your procedure, you may:  Feel sleepy for several hours.  Feel clumsy and have poor balance for several hours.  Feel forgetful about what happened after the procedure.  Have poor judgment for several hours.  Feel nauseous or vomit.  Have a sore throat if you had a breathing tube during the procedure. Follow these instructions at home: For at least 24 hours after the procedure:      Have a responsible adult stay with you. It is important to have someone help care for you until you are awake and alert.  Rest as needed.  Do not: ? Participate in activities in which you could fall or become injured. ? Drive. ? Use heavy machinery. ? Drink alcohol. ? Take sleeping pills or medicines that cause drowsiness. ? Make important decisions or sign legal documents. ? Take care of children on your own. Eating and drinking  Follow the diet that is recommended by your health care provider.  If you vomit, drink water, juice, or soup when you can drink without vomiting.  Make sure you have little or no nausea before eating solid foods. General instructions  Take over-the-counter and prescription medicines only as told by your health care provider.  If you have sleep apnea, surgery  and certain medicines can increase your risk for breathing problems. Follow instructions from your health care provider about wearing your sleep device: ? Anytime you are sleeping, including during daytime naps. ? While taking prescription pain medicines, sleeping medicines, or medicines that make you drowsy.  If you smoke, do not smoke without supervision.  Keep all follow-up visits as told by your health care provider. This is important. Contact a health care provider if:  You keep feeling nauseous or you keep vomiting.  You feel light-headed.  You develop a rash.  You have a fever. Get help right away if:  You have trouble breathing. Summary  For several hours after your procedure, you may feel sleepy and have poor judgment.  Have a responsible adult stay with you for at least 24 hours or until you are awake and alert. This information is not intended to replace advice given to  you by your health care provider. Make sure you discuss any questions you have with your health care provider. Document Released: 07/21/2015 Document Revised: 06/28/2017 Document Reviewed: 07/21/2015 Elsevier Patient Education  2020 [employer].

## 2019-01-27 HISTORY — PX: FRACTURE SURGERY: SHX138

## 2019-01-28 LAB — HEPATIC FUNCTION PANEL
AG Ratio: 1.5 (calc) (ref 1.0–2.5)
ALT: 30 U/L — ABNORMAL HIGH (ref 6–29)
AST: 25 U/L (ref 10–35)
Albumin: 4.4 g/dL (ref 3.6–5.1)
Alkaline phosphatase (APISO): 59 U/L (ref 37–153)
Bilirubin, Direct: 0.4 mg/dL — ABNORMAL HIGH (ref 0.0–0.2)
Globulin: 3 g/dL (calc) (ref 1.9–3.7)
Indirect Bilirubin: 1.7 mg/dL (calc) — ABNORMAL HIGH (ref 0.2–1.2)
Total Bilirubin: 2.1 mg/dL — ABNORMAL HIGH (ref 0.2–1.2)
Total Protein: 7.4 g/dL (ref 6.1–8.1)

## 2019-01-28 LAB — HEPATITIS C RNA QUANTITATIVE
HCV Quantitative Log: <1.18 Log IU/mL
HCV RNA, PCR, QN: <15 IU/mL

## 2019-01-30 ENCOUNTER — Ambulatory Visit (HOSPITAL_COMMUNITY)
Admission: RE | Admit: 2019-01-30 | Discharge: 2019-01-30 | Disposition: A | Discharge status: Home / Self Care | Payer: BC Managed Care – PPO | Source: Ambulatory Visit | Attending: Pulmonary Disease | Admitting: Pulmonary Disease

## 2019-01-30 ENCOUNTER — Other Ambulatory Visit: Payer: Self-pay

## 2019-01-30 ENCOUNTER — Other Ambulatory Visit (HOSPITAL_COMMUNITY): Payer: Self-pay | Admitting: Pulmonary Disease

## 2019-01-30 DIAGNOSIS — M25511 Pain in right shoulder: Secondary | ICD-10-CM

## 2019-01-31 ENCOUNTER — Telehealth (INDEPENDENT_AMBULATORY_CARE_PROVIDER_SITE_OTHER): Payer: Self-pay | Admitting: Nurse Practitioner

## 2019-01-31 NOTE — Telephone Encounter (Signed)
Amber Peterson pls send patient a lab order in 3 months to check hepatic panel and Hep C RNA quant. thx

## 2019-02-01 ENCOUNTER — Other Ambulatory Visit (INDEPENDENT_AMBULATORY_CARE_PROVIDER_SITE_OTHER): Payer: Self-pay | Admitting: *Deleted

## 2019-02-01 ENCOUNTER — Encounter (HOSPITAL_COMMUNITY): Payer: Self-pay

## 2019-02-01 ENCOUNTER — Other Ambulatory Visit (HOSPITAL_COMMUNITY)
Admission: RE | Admit: 2019-02-01 | Discharge: 2019-02-01 | Disposition: A | Discharge status: Home / Self Care | Payer: BC Managed Care – PPO | Source: Ambulatory Visit | Attending: Internal Medicine | Admitting: Internal Medicine

## 2019-02-01 ENCOUNTER — Other Ambulatory Visit: Payer: Self-pay

## 2019-02-01 ENCOUNTER — Encounter (HOSPITAL_COMMUNITY)
Admission: RE | Admit: 2019-02-01 | Discharge: 2019-02-01 | Disposition: A | Discharge status: Home / Self Care | Payer: BC Managed Care – PPO | Source: Ambulatory Visit | Attending: Internal Medicine | Admitting: Internal Medicine

## 2019-02-01 DIAGNOSIS — B182 Chronic viral hepatitis C: Secondary | ICD-10-CM

## 2019-02-01 NOTE — Telephone Encounter (Signed)
Labs have been ordered and the patient will be sent a letter as a reminder.

## 2019-02-03 ENCOUNTER — Encounter (HOSPITAL_COMMUNITY): Admission: RE | Payer: Self-pay | Source: Home / Self Care

## 2019-02-03 ENCOUNTER — Ambulatory Visit (HOSPITAL_COMMUNITY)
Admission: RE | Admit: 2019-02-03 | Payer: BC Managed Care – PPO | Source: Home / Self Care | Admitting: Internal Medicine

## 2019-02-03 SURGERY — COLONOSCOPY WITH PROPOFOL
Anesthesia: Monitor Anesthesia Care

## 2019-02-14 ENCOUNTER — Other Ambulatory Visit: Payer: Self-pay | Admitting: Orthopaedic Surgery

## 2019-02-14 DIAGNOSIS — M25511 Pain in right shoulder: Secondary | ICD-10-CM

## 2019-02-17 ENCOUNTER — Ambulatory Visit
Admission: RE | Admit: 2019-02-17 | Discharge: 2019-02-17 | Disposition: A | Discharge status: Home / Self Care | Payer: BC Managed Care – PPO | Source: Ambulatory Visit | Attending: Orthopaedic Surgery | Admitting: Orthopaedic Surgery

## 2019-02-17 DIAGNOSIS — M25511 Pain in right shoulder: Secondary | ICD-10-CM

## 2019-02-21 ENCOUNTER — Other Ambulatory Visit: Payer: Self-pay

## 2019-02-21 DIAGNOSIS — Z20822 Contact with and (suspected) exposure to covid-19: Secondary | ICD-10-CM

## 2019-02-23 LAB — NOVEL CORONAVIRUS, NAA: SARS-CoV-2, NAA: DETECTED — AB

## 2019-03-14 NOTE — Patient Instructions (Addendum)
DUE TO COVID-19 ONLY ONE VISITOR IS ALLOWED TO COME WITH YOU AND STAY IN THE WAITING ROOM ONLY DURING PRE OP AND PROCEDURE DAY OF SURGERY. THE 1 VISITOR MAY VISIT WITH YOU AFTER SURGERY IN YOUR PRIVATE ROOM DURING VISITING HOURS ONLY!                  TELEAH TAGER   Your procedure is scheduled on: 03/22/19   Report to [facility] Main  Entrance Report to admitting at 6:00 AM      Call this number if you have problems the morning of surgery [place], NO CHEWING GUM [place].   Do not eat food After Midnight.   YOU MAY HAVE CLEAR LIQUIDS FROM MIDNIGHT UNTIL 4:30 AM.   At 4:30 AM Please finish the prescribed Pre-Surgery  drink  . Nothing by mouth after you finish the  drink !   Take these medicines the morning of surgery with A SIP OF WATER: Venlafaxine, Nebivolol  Use your inhalers and bring them with you to the hospital                                 You may not have any metal on your body including hair pins and              piercings  Do not wear jewelry, make-up, lotions, powders or perfumes, deodorant             Do not wear nail polish on your fingernails.  Do not shave  48 hours prior to surgery.                 Do not bring valuables to the hospital. [place].  Contacts, dentures or bridgework may not be worn into surgery.     Name and phone number of your driver:  Special Instructions: N/A              Please read over the following fact sheets you were given: _____________________________________________________________________             [facility] - Preparing for Surgery  Before surgery, you can play an important role.   Because skin is not sterile, your skin needs to be as free of germs as possible.   You can reduce the number of germs on your skin by washing with CHG (chlorahexidine gluconate) soap before surgery.    CHG is an antiseptic cleaner which kills germs and bonds with the skin to continue killing germs even after washing. Please DO NOT use if you have an allergy to CHG or antibacterial soaps.   If your skin becomes reddened/irritated stop using the CHG and inform your nurse when you arrive at Short Stay. Do not shave (including legs and underarms) for at least 48 hours prior to the first CHG shower.   Please follow these instructions carefully:   1.  Shower with CHG Soap the night before surgery and the  morning of Surgery.  2.  If you choose to wash your hair, wash your hair first as usual with your  normal  shampoo.  3.  After you shampoo, rinse your hair and body thoroughly to remove the  shampoo.  4.  Use CHG as you would any other liquid soap.  You can apply chg directly  to the skin and wash                       Gently with a scrungie or clean washcloth.  5.  Apply the CHG Soap to your body ONLY FROM THE NECK DOWN.   Do not use on face/ open                           Wound or open sores. Avoid contact with eyes, ears mouth and genitals (private parts).                       Wash face,  Genitals (private parts) with your normal soap.             6.  Wash thoroughly, paying special attention to the area where your surgery  will be performed.  7.  Thoroughly rinse your body with warm water from the neck down.  8.  DO NOT shower/wash with your normal soap after using and rinsing off  the CHG Soap.             9.  Pat yourself dry with a clean towel.            10.  Wear clean pajamas.            11.  Place clean sheets on your bed the night of your first shower and do not  sleep with pets. Day of Surgery : Do not apply any lotions/deodorants the morning of surgery.  Please wear clean clothes to the hospital/surgery center.  FAILURE TO FOLLOW THESE INSTRUCTIONS MAY RESULT IN THE CANCELLATION OF YOUR SURGERY PATIENT  SIGNATURE_________________________________  NURSE SIGNATURE__________________________________  ________________________________________________________________________   Adam Phenix  An incentive spirometer is a tool that can help keep your lungs clear and active. This tool measures how well you are filling your lungs with each breath. Taking long deep breaths may help reverse or decrease the chance of developing breathing (pulmonary) problems (especially infection) following:  A long period of time when you are unable to move or be active. BEFORE THE PROCEDURE   If the spirometer includes an indicator to show your best effort, your nurse or respiratory therapist will set it to a desired goal.  If possible, sit up straight or lean slightly forward. Try not to slouch.  Hold the incentive spirometer in an upright position. INSTRUCTIONS FOR USE  1. Sit on the edge of your bed if possible, or sit up as far as you can in bed or on a chair. 2. Hold the incentive spirometer in an upright position. 3. Breathe out normally. 4. Place the mouthpiece in your mouth and seal your lips tightly around it. 5. Breathe in slowly and as deeply as possible, raising the piston or the ball toward the top of the column. 6. Hold your breath for 3-5 seconds or for as long as possible. Allow the piston or ball to fall to the bottom of the column. 7. Remove the mouthpiece from your mouth and breathe out normally. 8. Rest for a few seconds and repeat Steps 1 through 7 at least 10 times every 1-2 hours when you are awake. Take your time and take a few normal breaths between deep breaths. 9. The spirometer may include an indicator to show your best effort.  Use the indicator as a goal to work toward during each repetition. 10. After each set of 10 deep breaths, practice coughing to be sure your lungs are clear. If you have an incision (the cut made at the time of surgery), support your incision when coughing  by placing a pillow or rolled up towels firmly against it. Once you are able to get out of bed, walk around indoors and cough well. You may stop using the incentive spirometer when instructed by your caregiver.  RISKS AND COMPLICATIONS  Take your time so you do not get dizzy or light-headed.  If you are in pain, you may need to take or ask for pain medication before doing incentive spirometry. It is harder to take a deep breath if you are having pain. AFTER USE  Rest and breathe slowly and easily.  It can be helpful to keep track of a log of your progress. Your caregiver can provide you with a simple table to help with this. If you are using the spirometer at home, follow these instructions: [place] IF:   You are having difficultly using the spirometer.  You have trouble using the spirometer as often as instructed.  Your pain medication is not giving enough relief while using the spirometer.  You develop fever of 100.5 F (38.1 C) or higher. SEEK IMMEDIATE MEDICAL CARE IF:   You cough up bloody sputum that had not been present before.  You develop fever of 102 F (38.9 C) or greater.  You develop worsening pain at or near the incision site. MAKE SURE YOU:   Understand these instructions.  Will watch your condition.  Will get help right away if you are not doing well or get worse. Document Released: 08/10/2006 Document Revised: 06/22/2011 Document Reviewed: 10/11/2006 [facility] Patient Information 2014 [place], [place].   ________________________________________________________________________

## 2019-03-15 ENCOUNTER — Encounter (HOSPITAL_COMMUNITY)
Admission: RE | Admit: 2019-03-15 | Discharge: 2019-03-15 | Disposition: A | Discharge status: Home / Self Care | Payer: BC Managed Care – PPO | Source: Ambulatory Visit | Attending: Orthopaedic Surgery | Admitting: Orthopaedic Surgery

## 2019-03-15 ENCOUNTER — Encounter (HOSPITAL_COMMUNITY): Payer: Self-pay

## 2019-03-15 ENCOUNTER — Other Ambulatory Visit: Payer: Self-pay

## 2019-03-15 DIAGNOSIS — R9431 Abnormal electrocardiogram [ECG] [EKG]: Secondary | ICD-10-CM | POA: Insufficient documentation

## 2019-03-15 DIAGNOSIS — I10 Essential (primary) hypertension: Secondary | ICD-10-CM | POA: Diagnosis not present

## 2019-03-15 DIAGNOSIS — Z01818 Encounter for other preprocedural examination: Secondary | ICD-10-CM | POA: Insufficient documentation

## 2019-03-15 HISTORY — DX: Essential (primary) hypertension: I10

## 2019-03-15 HISTORY — DX: Personal history of urinary calculi: Z87.442

## 2019-03-15 HISTORY — DX: Unspecified osteoarthritis, unspecified site: M19.90

## 2019-03-15 HISTORY — DX: Depression, unspecified: F32.A

## 2019-03-15 HISTORY — DX: Anxiety disorder, unspecified: F41.9

## 2019-03-15 LAB — CBC
HCT: 38.8 % (ref 36.0–46.0)
Hemoglobin: 13.5 g/dL (ref 12.0–15.0)
MCH: 33.9 pg (ref 26.0–34.0)
MCHC: 34.8 g/dL (ref 30.0–36.0)
MCV: 97.5 fL (ref 80.0–100.0)
Platelets: 141 10*3/uL — ABNORMAL LOW (ref 150–400)
RBC: 3.98 MIL/uL (ref 3.87–5.11)
RDW: 12.7 % (ref 11.5–15.5)
WBC: 8.6 10*3/uL (ref 4.0–10.5)
nRBC: 0 % (ref 0.0–0.2)

## 2019-03-15 LAB — BASIC METABOLIC PANEL WITH GFR
Anion gap: 8 (ref 5–15)
BUN: 9 mg/dL (ref 8–23)
CO2: 26 mmol/L (ref 22–32)
Calcium: 9.2 mg/dL (ref 8.9–10.3)
Chloride: 106 mmol/L (ref 98–111)
Creatinine, Ser: 0.8 mg/dL (ref 0.44–1.00)
GFR calc Af Amer: >60 mL/min
GFR calc non Af Amer: >60 mL/min
Glucose, Bld: 103 mg/dL — ABNORMAL HIGH (ref 70–99)
Potassium: 4.2 mmol/L (ref 3.5–5.1)
Sodium: 140 mmol/L (ref 135–145)

## 2019-03-15 LAB — SURGICAL PCR SCREEN
MRSA, PCR: NEGATIVE
Staphylococcus aureus: POSITIVE — AB

## 2019-03-15 NOTE — Progress Notes (Signed)
PCP - Dr. Susann Givens Cardiologist - none  Chest x-ray - no EKG - 03/15/19  On chart Stress Test - no ECHO -no  Cardiac Cath - no  Sleep Study - no CPAP -   Fasting Blood Sugar - NA Checks Blood Sugar _____ times a day  Blood Thinner Instructions:NA Aspirin Instructions: Last Dose:  Anesthesia review:   Patient denies shortness of breath, fever, cough and chest pain at PAT appointment yes  Patient verbalized understanding of instructions that were given to them at the PAT appointment. Patient was also instructed that they will need to review over the PAT instructions again at home before surgery. yes

## 2019-03-20 NOTE — H&P (Signed)
PREOPERATIVE H&P  Chief Complaint: right shoulder instability,fracture  HPI: Amber Peterson is a 63 y.o. female who presents for preoperative history and physical prior to scheduled surgery, REVERSE SHOULDER ARTHROPLASTY.   Patient has a past medical history significant for HTN, hepatitis C, and asthma.   The patient is a 63 year old retiree who had an acute injury on 01-27-2019 when she had a fall off a ladder. She tried to catch herself and had a dislocation event. She was seen by  Dr. Case and evaluate. It was noted she had a large glenoid fracture as well as possible subscapularis injury with underlying arthritis of the joint. She was referred to Dr. Griffin Basil for further evaluation.  The patient continues to have instability and is unable to really move her arm secondary to this. She tries to mentally stay away from positions that cause her shoulder to pop out of place.    Her symptoms are rated as moderate to severe, and have been worsening.  This is significantly impairing activities of daily living.    Please see clinic note for further details on this patient's care.    She has elected for surgical management.   Past Medical History:  Diagnosis Date  . Anxiety   . Arthritis    all major joint  . Asthma 07/22/2017  . Depression   . Hepatitis C 07/22/2017  . History of kidney stones   . Hypertension    Past Surgical History:  Procedure Laterality Date  . ABDOMINAL HYSTERECTOMY  1995  . complete hysterectomy    . FRACTURE SURGERY Right 01/27/2019  . tonsillectomy    . TONSILLECTOMY  2000   Social History   Socioeconomic History  . Marital status: Divorced    Spouse name: Not on file  . Number of children: Not on file  . Years of education: Not on file  . Highest education level: Not on file  Occupational History  . Not on file  Social Needs  . Financial resource strain: Not on file  . Food insecurity    Worry: Not on file    Inability: Not on file  .  Transportation needs    Medical: Not on file    Non-medical: Not on file  Tobacco Use  . Smoking status: Never Smoker  . Smokeless tobacco: Never Used  Substance and Sexual Activity  . Alcohol use: Not Currently    Comment: rarely  . Drug use: Never  . Sexual activity: Not on file  Lifestyle  . Physical activity    Days per week: Not on file    Minutes per session: Not on file  . Stress: Not on file  Relationships  . Social Herbalist on phone: Not on file    Gets together: Not on file    Attends religious service: Not on file    Active member of club or organization: Not on file    Attends meetings of clubs or organizations: Not on file    Relationship status: Not on file  Other Topics Concern  . Not on file  Social History Narrative  . Not on file   No family history on file. Allergies  Allergen Reactions  . Penicillins Hives and Itching    Did it involve swelling of the face/tongue/throat, SOB, or low BP? No Did it involve sudden or severe rash/hives, skin peeling, or any reaction on the inside of your mouth or nose? No Did you need to seek medical  attention at a hospital or doctor's office? No When did it last happen?63 years old If all above answers are "NO", may proceed with cephalosporin use.    . Sulfa Antibiotics Hives and Itching   Prior to Admission medications   Medication Sig Start Date End Date Taking? Authorizing Provider  albuterol (VENTOLIN HFA) 108 (90 Base) MCG/ACT inhaler Inhale 2 puffs into the lungs every 6 (six) hours as needed for wheezing or shortness of breath.   Yes [provider]  budesonide-formoterol (SYMBICORT) 160-4.5 MCG/ACT inhaler Inhale 2 puffs into the lungs 2 (two) times daily.   Yes [provider]  HYDROcodone-acetaminophen (NORCO) 7.5-325 MG tablet Take 1 tablet by mouth every 6 (six) hours as needed for moderate pain.    Yes [provider]  hydroxypropyl methylcellulose / hypromellose  (ISOPTO TEARS / GONIOVISC) 2.5 % ophthalmic solution Place 1 drop into both eyes 2 (two) times daily as needed for dry eyes.   Yes [provider]  nebivolol (BYSTOLIC) 10 MG tablet Take 10 mg by mouth daily.   Yes [provider]  traZODone (DESYREL) 50 MG tablet Take 50 mg by mouth at bedtime as needed for sleep.   Yes [provider]  venlafaxine XR (EFFEXOR-XR) 150 MG 24 hr capsule Take 150 mg by mouth daily with breakfast.   Yes [provider]    ROS: All other systems have been reviewed and were otherwise negative with the exception of those mentioned in the HPI and as above.  Physical Exam: General: Alert, no acute distress Cardiovascular: No pedal edema Respiratory: No cyanosis, no use of accessory musculature GI: No organomegaly, abdomen is soft and non-tender Skin: No lesions in the area of chief complaint Neurologic: Sensation intact distally Psychiatric: Patient is competent for consent with normal mood and affect Lymphatic: No axillary or cervical lymphadenopathy  MUSCULOSKELETAL:  Right shoulder: Range of motion limited due to pain. Axillary nerve is firing.  She has anterior apprehension findings in the abduction external rotation position.   Imaging: Review of CT demonstrating 40% anterior glenoid involvement in a fracture.  It is comminuted and not amenable to fixation.  She has end stage arthritis of the joint as well.    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  Plan: Plan for Procedure(s): REVERSE SHOULDER ARTHROPLASTY  We talked at length about her CT which demonstrates 40% anterior and inferior bony involvement of the glenoid and significant almost bone on bone arthritis of the joint itself.  Based on this we think the patient would be benefited by a reverse total shoulder arthroplasty with an autograft bone block reconstruction of the glenoid to preserve bone stock with the  humeral head.  Patient understands the risks, benefits and alternatives, including periprosthetic fracture, stiffness, loosening and the need for further surgery.  The risks benefits and alternatives were discussed with the patient including but not limited to the risks of nonoperative treatment, versus surgical intervention including infection, bleeding, nerve injury,  blood clots, cardiopulmonary complications, morbidity, mortality, among others, and they were willing to proceed.   We additionally specifically discussed risks of axillary nerve injury, infection, continued pain and longevity of implants prior to beginning procedure.    Patient will be admitted for inpatient treatment for surgery, pain control, OT, prophylactic antibiotics, and discharge planning. The patient is planning to be discharged home with outpatient PT.   The patient acknowledged the explanation, agreed to proceed with the plan and consent was  signed.   Operative Plan: Reverse total shoulder arthroplasty with bony augment of the glenoid. (If she has inability we may consider total shoulder arthroplasty, however, I don't see much benefit for this patient due to her level of function and activity, but will keep this in mind.) Discharge Medications: Mobic, Oxycodone, Zofran (Hx of Hep C - will likely avoid use of Tylenol) DVT Prophylaxis: None  Physical Therapy: Outpatient PT Special Discharge needs: [place], PA-C  03/20/2019 3:17 PM

## 2019-03-21 NOTE — Anesthesia Preprocedure Evaluation (Addendum)
Anesthesia Evaluation  Patient identified by MRN, date of birth, ID band Patient awake    Reviewed: Allergy & Precautions, NPO status , Patient's Chart, lab work & pertinent test results  Airway Mallampati: II  TM Distance: >3 FB Neck ROM: Full    Dental no notable dental hx. (+) Teeth Intact, Dental Advisory Given   Pulmonary asthma ,    Pulmonary exam normal breath sounds clear to auscultation       Cardiovascular Exercise Tolerance: Good hypertension, Pt. on medications and Pt. on home beta blockers Normal cardiovascular exam Rhythm:Regular Rate:Normal  09/03/18 EKG SR R67 w NSST changes   Neuro/Psych PSYCHIATRIC DISORDERS Depression    GI/Hepatic (+) Hepatitis -, C  Endo/Other    Renal/GU K+ 4.2 Cr 0.8     Musculoskeletal  (+) Arthritis ,   Abdominal   Peds  Hematology Hgb 13.5 Plt 141   Anesthesia Other Findings All PC, Sulfa  Reproductive/Obstetrics                           Anesthesia Physical Anesthesia Plan  ASA: III  Anesthesia Plan: General   Post-op Pain Management:  Regional for Post-op pain   Induction: Intravenous  PONV Risk Score and Plan: 4 or greater and Treatment may vary due to age or medical condition, Ondansetron, Dexamethasone and Midazolam  Airway Management Planned: Oral ETT  Additional Equipment: None  Intra-op Plan:   Post-operative Plan: Extubation in OR  Informed Consent:     Dental advisory given  Plan Discussed with:   Anesthesia Plan Comments: (GA w R ISB w exparel)        Anesthesia Quick Evaluation

## 2019-03-22 ENCOUNTER — Inpatient Hospital Stay (HOSPITAL_COMMUNITY): Payer: BC Managed Care – PPO

## 2019-03-22 ENCOUNTER — Encounter (HOSPITAL_COMMUNITY): Payer: Self-pay | Admitting: Emergency Medicine

## 2019-03-22 ENCOUNTER — Other Ambulatory Visit: Payer: Self-pay

## 2019-03-22 ENCOUNTER — Inpatient Hospital Stay (HOSPITAL_COMMUNITY): Payer: BC Managed Care – PPO | Admitting: Registered Nurse

## 2019-03-22 ENCOUNTER — Observation Stay (HOSPITAL_COMMUNITY)
Admission: RE | Admit: 2019-03-22 | Discharge: 2019-03-22 | Disposition: A | Discharge status: Home / Self Care | Payer: BC Managed Care – PPO | Attending: Orthopaedic Surgery | Admitting: Orthopaedic Surgery

## 2019-03-22 ENCOUNTER — Inpatient Hospital Stay (HOSPITAL_COMMUNITY): Payer: BC Managed Care – PPO | Admitting: Physician Assistant

## 2019-03-22 ENCOUNTER — Encounter (HOSPITAL_COMMUNITY)
Admission: RE | Disposition: A | Discharge status: Home / Self Care | Payer: Self-pay | Source: Home / Self Care | Attending: Orthopaedic Surgery

## 2019-03-22 ENCOUNTER — Observation Stay (HOSPITAL_COMMUNITY): Payer: BC Managed Care – PPO

## 2019-03-22 DIAGNOSIS — Z79899 Other long term (current) drug therapy: Secondary | ICD-10-CM | POA: Insufficient documentation

## 2019-03-22 DIAGNOSIS — S42141A Displaced fracture of glenoid cavity of scapula, right shoulder, initial encounter for closed fracture: Secondary | ICD-10-CM | POA: Diagnosis not present

## 2019-03-22 DIAGNOSIS — W11XXXA Fall on and from ladder, initial encounter: Secondary | ICD-10-CM | POA: Insufficient documentation

## 2019-03-22 DIAGNOSIS — Z882 Allergy status to sulfonamides status: Secondary | ICD-10-CM | POA: Insufficient documentation

## 2019-03-22 DIAGNOSIS — Z7951 Long term (current) use of inhaled steroids: Secondary | ICD-10-CM | POA: Insufficient documentation

## 2019-03-22 DIAGNOSIS — Z8619 Personal history of other infectious and parasitic diseases: Secondary | ICD-10-CM | POA: Insufficient documentation

## 2019-03-22 DIAGNOSIS — J45909 Unspecified asthma, uncomplicated: Secondary | ICD-10-CM | POA: Insufficient documentation

## 2019-03-22 DIAGNOSIS — M19011 Primary osteoarthritis, right shoulder: Secondary | ICD-10-CM | POA: Insufficient documentation

## 2019-03-22 DIAGNOSIS — S42151A Displaced fracture of neck of scapula, right shoulder, initial encounter for closed fracture: Secondary | ICD-10-CM | POA: Diagnosis present

## 2019-03-22 DIAGNOSIS — I10 Essential (primary) hypertension: Secondary | ICD-10-CM | POA: Diagnosis not present

## 2019-03-22 DIAGNOSIS — Z880 Allergy status to penicillin: Secondary | ICD-10-CM | POA: Insufficient documentation

## 2019-03-22 DIAGNOSIS — Z09 Encounter for follow-up examination after completed treatment for conditions other than malignant neoplasm: Secondary | ICD-10-CM

## 2019-03-22 DIAGNOSIS — F419 Anxiety disorder, unspecified: Secondary | ICD-10-CM | POA: Insufficient documentation

## 2019-03-22 DIAGNOSIS — F329 Major depressive disorder, single episode, unspecified: Secondary | ICD-10-CM | POA: Insufficient documentation

## 2019-03-22 DIAGNOSIS — Z419 Encounter for procedure for purposes other than remedying health state, unspecified: Secondary | ICD-10-CM

## 2019-03-22 HISTORY — PX: REVERSE SHOULDER ARTHROPLASTY: SHX5054

## 2019-03-22 SURGERY — ARTHROPLASTY, SHOULDER, TOTAL, REVERSE
Anesthesia: General | Site: Shoulder | Laterality: Right

## 2019-03-22 MED ORDER — OXYCODONE HCL 5 MG/5ML PO SOLN: 5.0000 mg | Freq: Once | ORAL | Status: DC | PRN

## 2019-03-22 MED ORDER — EPHEDRINE 5 MG/ML INJ
INTRAVENOUS | Status: AC
  Filled 2019-03-22: qty 10

## 2019-03-22 MED ORDER — DEXAMETHASONE SODIUM PHOSPHATE 10 MG/ML IJ SOLN
INTRAMUSCULAR | Status: AC
  Filled 2019-03-22: qty 1

## 2019-03-22 MED ORDER — MIDAZOLAM HCL 5 MG/5ML IJ SOLN
INTRAMUSCULAR | Status: DC | PRN
  Administered 2019-03-22 (×2): 1 mg via INTRAVENOUS

## 2019-03-22 MED ORDER — LIDOCAINE 2% (20 MG/ML) 5 ML SYRINGE
INTRAMUSCULAR | Status: DC | PRN
  Administered 2019-03-22: 80 mg via INTRAVENOUS

## 2019-03-22 MED ORDER — VANCOMYCIN HCL IN DEXTROSE 1-5 GM/200ML-% IV SOLN: 1000.0000 mg | Freq: Once | INTRAVENOUS | Status: DC

## 2019-03-22 MED ORDER — CHLORHEXIDINE GLUCONATE 4 % EX LIQD: 60.0000 mL | Freq: Once | CUTANEOUS | Status: DC

## 2019-03-22 MED ORDER — ONDANSETRON HCL 4 MG/2ML IJ SOLN
INTRAMUSCULAR | Status: DC | PRN
  Administered 2019-03-22: 4 mg via INTRAVENOUS

## 2019-03-22 MED ORDER — SUGAMMADEX SODIUM 200 MG/2ML IV SOLN
INTRAVENOUS | Status: DC | PRN
  Administered 2019-03-22: 150 mg via INTRAVENOUS

## 2019-03-22 MED ORDER — PROPOFOL 10 MG/ML IV BOLUS
INTRAVENOUS | Status: AC
  Filled 2019-03-22: qty 20

## 2019-03-22 MED ORDER — HYDROMORPHONE HCL 1 MG/ML IJ SOLN: 0.2500 mg | INTRAMUSCULAR | Status: DC | PRN

## 2019-03-22 MED ORDER — VANCOMYCIN HCL IN DEXTROSE 1-5 GM/200ML-% IV SOLN
1000.0000 mg | INTRAVENOUS | Status: AC
  Administered 2019-03-22: 08:00:00 1000 mg via INTRAVENOUS
  Filled 2019-03-22: qty 200

## 2019-03-22 MED ORDER — EPHEDRINE SULFATE-NACL 50-0.9 MG/10ML-% IV SOSY
PREFILLED_SYRINGE | INTRAVENOUS | Status: DC | PRN
  Administered 2019-03-22: 10 mg via INTRAVENOUS
  Administered 2019-03-22 (×2): 15 mg via INTRAVENOUS

## 2019-03-22 MED ORDER — MELOXICAM 7.5 MG PO TABS: 7.5000 mg | ORAL_TABLET | Freq: Every day | ORAL | 2 refills | Status: AC

## 2019-03-22 MED ORDER — 0.9 % SODIUM CHLORIDE (POUR BTL) OPTIME
TOPICAL | Status: DC | PRN
  Administered 2019-03-22: 1000 mL

## 2019-03-22 MED ORDER — MEPERIDINE HCL 50 MG/ML IJ SOLN: 6.2500 mg | INTRAMUSCULAR | Status: DC | PRN

## 2019-03-22 MED ORDER — ACETAMINOPHEN 325 MG PO TABS
650.0000 mg | ORAL_TABLET | Freq: Once | ORAL | Status: AC
  Administered 2019-03-22: 650 mg via ORAL
  Filled 2019-03-22: qty 2

## 2019-03-22 MED ORDER — ONDANSETRON HCL 4 MG/2ML IJ SOLN
INTRAMUSCULAR | Status: AC
  Filled 2019-03-22: qty 2

## 2019-03-22 MED ORDER — LIDOCAINE 2% (20 MG/ML) 5 ML SYRINGE
INTRAMUSCULAR | Status: AC
  Filled 2019-03-22: qty 5

## 2019-03-22 MED ORDER — ROCURONIUM BROMIDE 10 MG/ML (PF) SYRINGE
PREFILLED_SYRINGE | INTRAVENOUS | Status: AC
  Filled 2019-03-22: qty 10

## 2019-03-22 MED ORDER — OXYCODONE HCL 5 MG PO TABS: 5.0000 mg | ORAL_TABLET | Freq: Once | ORAL | Status: DC | PRN

## 2019-03-22 MED ORDER — MIDAZOLAM HCL 2 MG/2ML IJ SOLN
INTRAMUSCULAR | Status: AC
  Filled 2019-03-22: qty 2

## 2019-03-22 MED ORDER — VANCOMYCIN HCL 1 G IV SOLR
INTRAVENOUS | Status: DC | PRN
  Administered 2019-03-22: 1000 mg via TOPICAL

## 2019-03-22 MED ORDER — SODIUM CHLORIDE 0.9 % IR SOLN
Status: DC | PRN
  Administered 2019-03-22: 1000 mL

## 2019-03-22 MED ORDER — ONDANSETRON HCL 4 MG PO TABS: 4.0000 mg | ORAL_TABLET | Freq: Three times a day (TID) | ORAL | 1 refills | Status: AC | PRN

## 2019-03-22 MED ORDER — LACTATED RINGERS IV SOLN
INTRAVENOUS | Status: DC
  Administered 2019-03-22 (×2): via INTRAVENOUS

## 2019-03-22 MED ORDER — ONDANSETRON HCL 4 MG/2ML IJ SOLN: 4.0000 mg | Freq: Once | INTRAMUSCULAR | Status: DC | PRN

## 2019-03-22 MED ORDER — VANCOMYCIN HCL 1000 MG IV SOLR
INTRAVENOUS | Status: AC
  Filled 2019-03-22: qty 1000

## 2019-03-22 MED ORDER — DEXAMETHASONE SODIUM PHOSPHATE 10 MG/ML IJ SOLN
INTRAMUSCULAR | Status: DC | PRN
  Administered 2019-03-22: 5 mg via INTRAVENOUS

## 2019-03-22 MED ORDER — FENTANYL CITRATE (PF) 250 MCG/5ML IJ SOLN
INTRAMUSCULAR | Status: DC | PRN
  Administered 2019-03-22 (×3): 50 ug via INTRAVENOUS

## 2019-03-22 MED ORDER — STERILE WATER FOR IRRIGATION IR SOLN
Status: DC | PRN
  Administered 2019-03-22: 2000 mL

## 2019-03-22 MED ORDER — ROCURONIUM BROMIDE 10 MG/ML (PF) SYRINGE
PREFILLED_SYRINGE | INTRAVENOUS | Status: DC | PRN
  Administered 2019-03-22: 40 mg via INTRAVENOUS

## 2019-03-22 MED ORDER — FENTANYL CITRATE (PF) 250 MCG/5ML IJ SOLN
INTRAMUSCULAR | Status: AC
  Filled 2019-03-22: qty 5

## 2019-03-22 MED ORDER — MUPIROCIN 2 % EX OINT
1.0000 "application " | TOPICAL_OINTMENT | Freq: Once | CUTANEOUS | Status: DC
  Filled 2019-03-22: qty 22

## 2019-03-22 MED ORDER — TRANEXAMIC ACID-NACL 1000-0.7 MG/100ML-% IV SOLN
1000.0000 mg | INTRAVENOUS | Status: AC
  Administered 2019-03-22: 1000 mg via INTRAVENOUS
  Filled 2019-03-22: qty 100

## 2019-03-22 MED ORDER — OXYCODONE HCL 5 MG PO TABS: ORAL_TABLET | ORAL | 0 refills | Status: AC

## 2019-03-22 MED ORDER — PROPOFOL 10 MG/ML IV BOLUS
INTRAVENOUS | Status: DC | PRN
  Administered 2019-03-22: 120 mg via INTRAVENOUS

## 2019-03-22 SURGICAL SUPPLY — 77 items
AID PSTN UNV HD RSTRNT DISP (MISCELLANEOUS) ×1
APL PRP STRL LF DISP 70% ISPRP (MISCELLANEOUS) ×2
AUGMENT BASEPLATE 25M 35D WEDG (Joint) IMPLANT
BASEPLATE AUGMNT 25M 35D WEDGE (Joint) ×3 IMPLANT
BIT DRILL 3.2 PERIPHERAL SCREW (BIT) ×2 IMPLANT
BLADE EXTENDED COATED 6.5IN (ELECTRODE) IMPLANT
BLADE SAW SAG 73X25 THK (BLADE) ×2
BLADE SAW SGTL 73X25 THK (BLADE) ×1 IMPLANT
BONE SCREW THREAD 6.5X35MM (Screw) ×1 IMPLANT
BSPLAT GLND 35D HLF WDG 25 (Joint) ×1 IMPLANT
CHLORAPREP W/TINT 26 (MISCELLANEOUS) ×6 IMPLANT
CLOSURE STERI-STRIP 1/2X4 (GAUZE/BANDAGES/DRESSINGS) ×1
CLOSURE WOUND 1/2 X4 (GAUZE/BANDAGES/DRESSINGS) ×1
CLSR STERI-STRIP ANTIMIC 1/2X4 (GAUZE/BANDAGES/DRESSINGS) ×2 IMPLANT
COOLER ICEMAN CLASSIC (MISCELLANEOUS) IMPLANT
COVER BACK TABLE 60X90IN (DRAPES) IMPLANT
COVER SURGICAL LIGHT HANDLE (MISCELLANEOUS) ×3 IMPLANT
COVER WAND RF STERILE (DRAPES) ×3 IMPLANT
DRAPE INCISE IOBAN 66X45 STRL (DRAPES) ×3 IMPLANT
DRAPE ORTHO SPLIT 77X108 STRL (DRAPES) ×6
DRAPE SHEET LG 3/4 BI-LAMINATE (DRAPES) ×3 IMPLANT
DRAPE SURG ORHT 6 SPLT 77X108 (DRAPES) ×2 IMPLANT
DRSG AQUACEL AG ADV 3.5X 6 (GAUZE/BANDAGES/DRESSINGS) ×3 IMPLANT
ELECT BLADE TIP CTD 4 INCH (ELECTRODE) ×3 IMPLANT
ELECT REM PT RETURN 15FT ADLT (MISCELLANEOUS) ×3 IMPLANT
FACESHIELD WRAPAROUND (MASK) ×9 IMPLANT
FACESHIELD WRAPAROUND OR TEAM (MASK) IMPLANT
GLENOSPHERE REV SHOULDER 36 (Joint) ×2 IMPLANT
GLOVE BIO SURGEON STRL SZ 6.5 (GLOVE) ×4 IMPLANT
GLOVE BIO SURGEONS STRL SZ 6.5 (GLOVE) ×2
GLOVE BIOGEL PI IND STRL 6.5 (GLOVE) ×1 IMPLANT
GLOVE BIOGEL PI IND STRL 8 (GLOVE) ×2 IMPLANT
GLOVE BIOGEL PI INDICATOR 6.5 (GLOVE) ×2
GLOVE BIOGEL PI INDICATOR 8 (GLOVE) ×4
GLOVE ECLIPSE 8.0 STRL XLNG CF (GLOVE) ×6 IMPLANT
GOWN STRL REUS W/ TWL XL LVL3 (GOWN DISPOSABLE) ×1 IMPLANT
GOWN STRL REUS W/TWL LRG LVL3 (GOWN DISPOSABLE) ×3 IMPLANT
GOWN STRL REUS W/TWL XL LVL3 (GOWN DISPOSABLE) ×3
GUIDEWIRE GLENOID 2.5X220 (WIRE) ×2 IMPLANT
HANDPIECE INTERPULSE COAX TIP (DISPOSABLE) ×3
HEMOSTAT SURGICEL 2X14 (HEMOSTASIS) IMPLANT
IMPL REVERSE SHOULDER 0X3.5 (Shoulder) IMPLANT
IMPLANT REVERSE SHOULDER 0X3.5 (Shoulder) ×3 IMPLANT
INSERT HUMERAL 36X6MM 12.5DEG (Insert) ×2 IMPLANT
KIT BASIN OR (CUSTOM PROCEDURE TRAY) ×3 IMPLANT
KIT STABILIZATION SHOULDER (MISCELLANEOUS) ×3 IMPLANT
KIT TURNOVER KIT A (KITS) IMPLANT
MANIFOLD NEPTUNE II (INSTRUMENTS) ×3 IMPLANT
NDL HYPO 25X1 1.5 SAFETY (NEEDLE) IMPLANT
NDL MAYO CATGUT SZ4 TPR NDL (NEEDLE) IMPLANT
NEEDLE HYPO 25X1 1.5 SAFETY (NEEDLE) IMPLANT
NEEDLE MAYO CATGUT SZ4 (NEEDLE) IMPLANT
NS IRRIG 1000ML POUR BTL (IV SOLUTION) ×3 IMPLANT
PACK SHOULDER (CUSTOM PROCEDURE TRAY) ×3 IMPLANT
PAD COLD SHLDR WRAP-ON (PAD) IMPLANT
PENCIL SMOKE EVACUATOR (MISCELLANEOUS) IMPLANT
RESTRAINT HEAD UNIVERSAL NS (MISCELLANEOUS) ×3 IMPLANT
SCREW 5.0X18 (Screw) ×4 IMPLANT
SCREW 5.0X38 SMALL F/PERFORM (Screw) ×2 IMPLANT
SCREW BONE THREAD 6.5X35 (Screw) ×1 IMPLANT
SCREW PERIPHERAL 5.0X34 (Screw) ×2 IMPLANT
SET HNDPC FAN SPRY TIP SCT (DISPOSABLE) ×1 IMPLANT
SLING ULTRA III MED (ORTHOPEDIC SUPPLIES) ×3 IMPLANT
SPONGE LAP 18X18 X RAY DECT (DISPOSABLE) IMPLANT
STEM HUMERAL SZ2B STND 70 PTC (Stem) ×3 IMPLANT
STEM HUMERAL SZ2BSTD 70 PTC (Stem) IMPLANT
STRIP CLOSURE SKIN 1/2X4 (GAUZE/BANDAGES/DRESSINGS) ×1 IMPLANT
SUCTION FRAZIER HANDLE 12FR (TUBING) ×2
SUCTION TUBE FRAZIER 12FR DISP (TUBING) ×1 IMPLANT
SUT ETHIBOND 2 V 37 (SUTURE) ×3 IMPLANT
SUT ETHIBOND NAB CT1 #1 30IN (SUTURE) ×3 IMPLANT
SUT FIBERWIRE #5 38 CONV NDL (SUTURE) ×12
SUT MNCRL AB 4-0 PS2 18 (SUTURE) ×3 IMPLANT
SUT VIC AB 0 CT1 36 (SUTURE) ×3 IMPLANT
SUTURE FIBERWR #5 38 CONV NDL (SUTURE) ×4 IMPLANT
TOWEL OR 17X26 10 PK STRL BLUE (TOWEL DISPOSABLE) ×3 IMPLANT
WATER STERILE IRR 1000ML POUR (IV SOLUTION) ×6 IMPLANT

## 2019-03-22 NOTE — Anesthesia Procedure Notes (Signed)
Procedure Name: Intubation Date/Time: 03/22/2019 8:34 AM Performed by: Talbot Grumbling, CRNA Pre-anesthesia Checklist: Patient identified, Emergency Drugs available, Suction available and Patient being monitored Patient Re-evaluated:Patient Re-evaluated prior to induction Oxygen Delivery Method: Circle system utilized Preoxygenation: Pre-oxygenation with 100% oxygen Induction Type: IV induction Ventilation: Mask ventilation without difficulty Laryngoscope Size: 3 and Mac Grade View: Grade I Tube type: Oral Tube size: 7.0 mm Number of attempts: 1 Airway Equipment and Method: Stylet Placement Confirmation: ETT inserted through vocal cords under direct vision,  positive ETCO2 and breath sounds checked- equal and bilateral Secured at: 20 cm Tube secured with: Tape Dental Injury: Teeth and Oropharynx as per pre-operative assessment

## 2019-03-22 NOTE — Progress Notes (Signed)
Pt did not take bystolic today, last dose was 2330 yesterday.  Per Dr. Valma Cava, no dose needed today.

## 2019-03-22 NOTE — Discharge Summary (Signed)
Patient ID: Amber Peterson MRN: [id number] DOB/AGE: [birth year] 63 y.o.  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 Anxiety   . Arthritis    all major joint  . Asthma 07/22/2017  . Depression   . Hepatitis C 07/22/2017  . History of kidney stones   . Hypertension     Procedures Performed: Right half wedge augmented reverse Total Shoulder Arthroplasty  Discharged Condition: good/stable  Hospital Course: Patient brought in to [facility] for a reverse total shoulder arthroplasty as an outpatient for surgery.  She tolerated procedure well.  She was found to be stable for DC home after surgery.  Patient was instructed on specific activity restrictions and all questions were answered.  Consults: None  Significant Diagnostic Studies: No additional pertinent studies  Treatments: Right half wedge augmented reverse Total Shoulder Arthroplasty  Discharge Exam: Dressing CDI and sling well fitting.  full and painless ROM throughout hand.  Axillary nerve sensation/motor altered in setting of block and unable to be fully tested.  Distal motor and sensory altered in setting of block.  Dispo: Home Medications: Mobic, Oxycodone (to take for breakthrough pain, if pain not controlled by current daily pain medication), Zofran. Prescriptions sent electronically to patient's local pharmacy.  Follow-up: 1 week in office for incision check and x-ray.   Disposition: Discharge disposition: 01-Home or Self Care       Discharge Instructions    Call MD for:  redness, tenderness, or signs of infection (pain, swelling, redness, odor or green/yellow discharge around incision site)   Complete by: As directed    Call MD for:  severe uncontrolled pain    Complete by: As directed    Call MD for:  temperature >100.4   Complete by: As directed    Discharge instructions   Complete by: As directed    Ophelia Charter MD, MPH Noemi Chapel, PA-C [place] [address], [address] [phone] (tel)   [phone] (fax)   [place] may leave the operative dressing in place until your follow-up appointment. KEEP THE INCISIONS CLEAN AND DRY.  SURGICAL BANDAGE:  There may be a small amount of fluid/bleeding leaking at the surgical site. You may notice some streaks/spots of blood on your surgical bandage. This is normal after surgery.  If it fills with liquid or blood (if it looks completely soaked) please call us immediately to change it for you.  ICE MACHINE: Use the provided Reed Breech ice machine as often as possible for the first 3-4 days, then as needed for pain relief.   You may use the ice machine as much as you prefer - 30 minutes with ice machine on. 30 minutes off. Repeat as often as you like. Keep a layer of cloth or a shirt between your skin and the cooling unit to prevent frost bite as it can get very cold. Do not sleep with the ice machine on - it may get too cold on your skin and cause frost bite.  SHOWERING: - You may shower on Post-Op Day #2.  - The dressing is water resistant but do not scrub it as it may start to peel up.   - You may remove the sling for showering, but keep  a water resistant pillow under the arm to keep both the  elbow and shoulder away from the body (mimicking the abduction sling).  - Gently pat the area dry.  - Do not soak the shoulder in water. Do not go swimming in the pool or ocean until your sutures are removed. - KEEP THE INCISIONS CLEAN AND DRY.  EXERCISES - Wear the sling at all times except when doing your exercises. You may remove the sling for showering, but keep the arm across the chest or in a secondary  sling.    - Accidental/Purposeful External Rotation and shoulder flexion (reaching behind you) is to be avoided at all costs for the first month.  Please perform the exercises:   - Hand / Wrist  Range of Motion Exercises - Grip strengthening  - You may use the red ball attached to your sling as much you like. This will help with swelling and stiffness of your hand.  - At your first post-operative visit next week, we will discuss additional exercises you may perform.  REGIONAL ANESTHESIA (NERVE BLOCKS) The anesthesia team may have performed a nerve block for you if safe in the setting of your care.  This is a great tool used to minimize pain.  Typically the block may start wearing off overnight but the long acting medicine may last for 3-4 days.  The nerve block wearing off can be a challenging period but please utilize your as needed pain medications to try and manage this period.    POST-OP MEDICATIONS- Multimodal approach to pain control In general your pain will be controlled with a combination of substances.  Prescriptions unless otherwise discussed are electronically sent to your pharmacy.  This is a carefully made plan we use to minimize narcotic use.    Meloxicam OR Celebrex - Anti-inflammatory medication taken on a scheduled basis Oxycodone - This is a strong narcotic, to be used only on an "as needed" basis for pain. You can take oxycodone for break-through pain if not controlled by your normal daily pain medicines. Zofran - take as needed for nausea  Meloxicam/Celebrex - these are anti-inflammatory and pain relievers.  Do not take additional ibuprofen, naproxen or other NSAID while taking this medicine.   FOLLOW-UP If you develop a Fever (>101.5), Redness or Drainage from the surgical incision site, please call our office to arrange for an evaluation. Please call the office to schedule a follow-up appointment for a wound check, 7-10 days post-operatively.  IF YOU HAVE ANY QUESTIONS,  PLEASE FEEL FREE TO CALL OUR OFFICE.   HELPFUL INFORMATION  If you had a block, it will wear off between 8-24 hrs postop typically.  This is period when your pain may go from nearly zero to the pain you would have had post-op without the block.  This is an abrupt transition but nothing dangerous is happening.  You may take an extra dose of narcotic when this happens.  Your arm will be in a sling following surgery. You will be in this sling for the next 3-4 weeks.  I will let you know the exact duration at your follow-up visit.  You may be more comfortable sleeping in a semi-seated position the first few nights following surgery.  Keep a pillow propped under the elbow and forearm for comfort.  If you have a recliner type of chair it might be beneficial.  If not that is fine too, but it would be helpful to sleep propped up with pillows behind your operated  shoulder as well under your elbow and forearm.  This will reduce pulling on the suture lines.  When dressing, put your operative arm in the sleeve first.  When getting undressed, take your operative arm out last.  Loose fitting, button-down shirts are recommended.  In most states it is against the law to drive while your arm is in a sling. And certainly against the law to drive while taking narcotics.  You may return to work/school in the next couple of days when you feel up to it. Desk work and typing in the sling is fine.  We suggest you use the pain medication the first night prior to going to bed, in order to ease any pain when the anesthesia wears off. You should avoid taking pain medications on an empty stomach as it will make you nauseous.  Do not drink alcoholic beverages or take illicit drugs when taking pain medications.  Pain medication may make you constipated.  Below are a few solutions to try in this order: Decrease the amount of pain medication if you aren't having pain. Drink lots of decaffeinated fluids. Drink prune juice  and/or each dried prunes  If the first 3 don't work start with additional solutions Take Colace - an over-the-counter stool softener Take Senokot - an over-the-counter laxative Take Miralax - a stronger over-the-counter laxative     Allergies as of 03/22/2019      Reactions   Shellfish Allergy Anaphylaxis   Penicillins Hives, Itching   Did it involve swelling of the face/tongue/throat, SOB, or low BP? No Did it involve sudden or severe rash/hives, skin peeling, or any reaction on the inside of your mouth or nose? No Did you need to seek medical attention at a hospital or doctor's office? No When did it last happen?63 years old If all above answers are "NO", may proceed with cephalosporin use.   Sulfa Antibiotics Hives, Itching      Medication List    TAKE these medications   albuterol 108 (90 Base) MCG/ACT inhaler Commonly known as: VENTOLIN HFA Inhale 2 puffs into the lungs every 6 (six) hours as needed for wheezing or shortness of breath.   budesonide-formoterol 160-4.5 MCG/ACT inhaler Commonly known as: SYMBICORT Inhale 2 puffs into the lungs 2 (two) times daily.   HYDROcodone-acetaminophen 7.5-325 MG tablet Commonly known as: NORCO Take 1 tablet by mouth every 6 (six) hours as needed for moderate pain.   hydroxypropyl methylcellulose / hypromellose 2.5 % ophthalmic solution Commonly known as: ISOPTO TEARS / GONIOVISC Place 1 drop into both eyes 2 (two) times daily as needed for dry eyes.   meloxicam 7.5 MG tablet Commonly known as: Mobic Take 1 tablet (7.5 mg total) by mouth daily.   nebivolol 10 MG tablet Commonly known as: BYSTOLIC Take 10 mg by mouth daily.   ondansetron 4 MG tablet Commonly known as: Zofran Take 1 tablet (4 mg total) by mouth every 8 (eight) hours as needed for up to 7 days for nausea or vomiting.   oxyCODONE 5 MG immediate release tablet Commonly known as: Oxy IR/ROXICODONE Take 1-2 pills every 6 hrs as needed for break-through pain  that is not controlled by your normal daily pain medication. No more than 6 per day.   traZODone 50 MG tablet Commonly known as: DESYREL Take 50 mg by mouth at bedtime as needed for sleep.   venlafaxine XR 150 MG 24 hr capsule Commonly known as: EFFEXOR-XR Take 150 mg by mouth daily with breakfast.  XANAX PO Take by mouth.

## 2019-03-22 NOTE — Op Note (Signed)
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  Patients bony bankart involved about 40% of the anterior inferior glenoid but was medialized and nearly fully united.  Based on this we felt that half wedge augment would provide good stability without the need to take down the healing fracture.    Post-operative plan: The patient will be NWB in sling.  The patient will be discharged from PACU due to [place] exposure and since she was doing well in PACU.  DVT prophylaxis not indicated in isolated upper extremity surgery patient with no specific risks factors.  Pain control with PRN pain medication preferring oral medicines.  Follow up plan will be scheduled in approximately 7 days for incision check and XR.  Physical therapy to start after four weeks.  Implants:25 half wedge tornier baseplate, 40 screw, 36 standard glenosphere, size 2 b stem and 0 high offset with 6 poly tray  Post-Op Diagnosis: Same Surgeons:Primary: Hiram Gash, MD Assistants:Caroline McBane PA-C Location: [facility] ROOM 06 Anesthesia: General with Exparel Interscalene Antibiotics: Vancomycin 1061m IV and  locally Tourniquet time: None Estimated Blood Loss: 1409Complications: None Specimens: None Implants: Implant Name Type Inv. Item Serial No. Manufacturer Lot No. LRB No. Used Action  BASEPLATE AUGMNT [id number] - [id number] BASEPLATE AUGMNT [id number] [employer]  Right 1 Implanted  BONE SCREW THREAD 6.5X35MM - [id number] BONE SCREW THREAD 6.5X35MM  [employer]  Right 1 Implanted  SCREW 5.0X18 - [id number] SCREW 5.0X18  [employer]  Right 2 Implanted  GLENOSPHERE REV SHOULDER 36 - [id number] GLENOSPHERE REV  SHOULDER 36 [id number]  Right 1 Implanted  SCREW 5.0X38 SMALL F/PERFORM - [id number] SCREW 5.0X38 SMALL F/PERFORM  [employer]  Right 1 Implanted  SCREW PERIPHERAL 5.0X34 - [id number] SCREW PERIPHERAL 5.0X34  [employer]  Right 1 Implanted  IMPLANT REVERSE SHOULDER 0X3.5 - [id number] IMPLANT REVERSE SHOULDER 0X3.5  [employer] [id number] 1 Implanted  INSERT HUMERAL 36X6MM 12.5DEG - [id number] INSERT HUMERAL 36X6MM 12.5Tora PerchesINC [id number] 1 Implanted  STEM HUMERAL SZ2B STND 70 PTC - [id number] STEM HUMERAL SZ2B STND 70 PTC  [employer] [id number] 1 Implanted    Indications for Surgery:   Amber HEAGLEis a 63y.o. female with fall resulting in anterior recurrent instability in the setting of moderate to severe OA and a 40% anterior bankart injury with bone involvement.  We discussed that laterjet would leave her with an arthritic joint and likely have a high risk of complication and we thought that reverse would be a more predictable option for her.  Benefits and risks of operative and nonoperative management were discussed prior to surgery with patient/guardian(s) and informed consent form was completed.  Infection and need for further surgery were discussed as was prosthetic stability and cuff issues.  We additionally specifically discussed risks of axillary nerve injury, infection, periprosthetic fracture, continued pain and longevity of implants prior to beginning procedure.      Procedure:   The patient was identified in the preoperative holding area where the surgical site was marked. Block placed by anesthesia with exparel.  The patient was taken to the OR where a procedural timeout was called and the above noted  anesthesia was induced.  The patient was positioned beachchair on allen table with spider arm positioner.  Preoperative antibiotics were dosed.  The patient's right shoulder was prepped and draped in the usual sterile  fashion.  A second preoperative timeout was called.       Standard deltopectoral approach was performed with a #10 blade. We dissected down to the subcutaneous tissues and the cephalic vein was taken laterally with the deltoid. Clavipectoral fascia was incised in line with the incision. Deep retractors were placed. The long of the biceps tendon was identified and there was significant tenosynovitis present.  Tenodesis was performed to the pectoralis tendon with #2 Ethibond. The remaining biceps was followed up into the rotator interval where it was released.   The subscapularis was taken down in a full thickness layer with capsule along the humeral neck extending inferiorly around the humeral head. We continued releasing the capsule directly off of the osteophytes inferiorly all the way around the corner. This allowed [place] to dislocate the humeral head.   The humeral head had evidence of moderate osteoarthritic wear with areas of full-thickness cartilage loss and exposed subchondral bone.   The rotator cuff was carefully examined and the leading edge of the supraspinatus was frayed and the subscapularis was torn at the upper edge with some attritional damage.  The decision was confirmed that a reverse total shoulder was indicated for this patient.  There were osteophytes along the inferior humeral neck. The osteophytes were removed with an osteotome and a rongeur.  Osteophytes were removed with a rongeur and an osteotome and the anatomic neck was well visualized.     A humeral cutting guide was inserted down the intramedullary canal. The version was set at 20 of retroversion. Humeral osteotomy was performed with an oscillating saw. The head fragment was passed off the back table. A starter awl was used to open the humeral canal. We next used T-handle straight sound reamers to ream up to an appropriate fit. A chisel was used to remove proximal humeral bone. We then broached starting with a size one broach  and broaching up to 2 which obtained an appropriate fit. The broach handle was removed. A cut protector was placed. The broach handle was removed and a cut protector was placed. The humerus was retracted posteriorly and we turned our attention to glenoid exposure.  The subscapularis was again identified and immediately we took care to palpate the axillary nerve anteriorly and verify its position with gentle palpation as well as the tug test.  We then released the SGHL with bovie cautery prior to placing a curved mayo at the junction of the anterior glenoid well above the axillary nerve and bluntly dissecting the subscapularis from the capsule.  We then carefully protected the axillary nerve as we gently released the inferior capsule to fully mobilize the subscapularis.  An anterior deltoid retractor was then placed as well as a small Hohmann retractor superiorly.   The glenoid was inspected and had evidence of 40 anterior inferior bone loss with medialization of the large fragment.  This appeared mostly united in its position and led [place] to make the decision for a half wedge augment rather than autograft bone grafting.  This provided a healthy bed of bone with good seating.  We used our blueprint planning to identify a slightly higher and posterior position that would be in the vault and not perforate into the fracture.  We used our trials to make sure we were in a  good position.  We then reamed our paleo-glenoid and prepared for our augment.  We placed the half wedge reamer at the 4-5 oclock position and reamed till our trial fit well.  Patients height at '4\' 11"'  made this a complex approach but we had great seating and a intact vault at the end of the case.  We tapped and then placed a 77m size baseplate with half wedge at 4:30 was selected with a 6.5 mm x 40 mm length central screw.  The base plate was screwed into the glenoid vault obtaining secure fixation.We checked position on fluoro and were happy  with our seating. We next placed superior and inferior locking screws for additional fixation.  Next a 36 mm glenosphere was selected and impacted onto the baseplate. The center screw was tightened.  We turned attention back to the humeral side. The cut protector was removed. We trialed with multiple size tray and polyethylene options and selected a 6 which provided good stability and range of motion without excess soft tissue tension. The offset was dialed in to match the normal anatomy. The shoulder was trialed.  There was good ROM in all planes and the shoulder was stable with no inferior translation.  The real humeral implants were opened after again confirming sizes.  The trial was removed. #5 Fiberwire x4 sutures passed through the humeral neck for subscap repair. The humeral component was press-fit obtaining a secure fit. A +0 high offset tray was selected and impacted onto the stem.  A 36+6 polyethylene liner was impacted onto the stem.  The joint was reduced and thoroughly irrigated with pulsatile lavage. Subscap was repaired back with #5 Fiberwire sutures through bone tunnels. Hemostasis was obtained. The deltopectoral interval was reapproximated with #1 Ethibond. The subcutaneous tissues were closed with 2-0 Vicryl and the skin was closed with running monocryl.    The wounds were cleaned and dried and an Aquacel dressing was placed. The drapes taken down. The arm was placed into sling with abduction pillow. Patient was awakened, extubated, and transferred to the recovery room in stable condition. There were no intraoperative complications. The sponge, needle, and attention counts were  correct at the end of the case.    CNoemi Chapel PA-C, present and scrubbed throughout the case, critical for completion in a timely fashion, and for retraction, instrumentation, closure.

## 2019-03-22 NOTE — Interval H&P Note (Signed)
Patient had a glenoid fracture in chronic instability. We discuss reverse shoulder replacement again. She agreed to proceed. This will be outpatient surgery can we discuss this.

## 2019-03-22 NOTE — Transfer of Care (Signed)
Immediate Anesthesia Transfer of Care Note  Patient: Amber Peterson  Procedure(s) Performed: REVERSE SHOULDER ARTHROPLASTY (Right Shoulder)  Patient Location: PACU  Anesthesia Type:General  Level of Consciousness: awake, alert  and oriented  Airway & Oxygen Therapy: Patient Spontanous Breathing and Patient connected to face mask oxygen  Post-op Assessment: Report given to RN and Post -op Vital signs reviewed and stable  Post vital signs: Reviewed and stable  Last Vitals:  Vitals Value Taken Time  BP    Temp    Pulse    Resp    SpO2      Last Pain:  Vitals:   03/22/19 0716  TempSrc: Oral  PainSc:       Patients Stated Pain Goal: 4 ([phone])  Complications: No apparent anesthesia complications

## 2019-03-22 NOTE — Anesthesia Postprocedure Evaluation (Signed)
Anesthesia Post Note  Patient: Amber Peterson  Procedure(s) Performed: REVERSE SHOULDER ARTHROPLASTY (Right Shoulder)     Patient location during evaluation: PACU Anesthesia Type: General Level of consciousness: awake and alert Pain management: pain level controlled Vital Signs Assessment: post-procedure vital signs reviewed and stable Respiratory status: spontaneous breathing, nonlabored ventilation, respiratory function stable and patient connected to nasal cannula oxygen Cardiovascular status: blood pressure returned to baseline and stable Postop Assessment: no apparent nausea or vomiting Anesthetic complications: no    Last Vitals:  Vitals:   03/22/19 1115 03/22/19 1125  BP: 119/71 106/62  Pulse: 76 70  Resp: 12 19  Temp:  36.6 C  SpO2: 97% 95%    Last Pain:  Vitals:   03/22/19 1125  TempSrc:   PainSc: 0-No pain                 Barnet Glasgow

## 2019-03-22 NOTE — Anesthesia Procedure Notes (Signed)
Anesthesia Regional Block: Interscalene brachial plexus block   Pre-Anesthetic Checklist: ,, timeout performed, Correct Patient, Correct Site, Correct Laterality, Correct Procedure, Correct Position, site marked, Risks and benefits discussed,  Surgical consent,  Pre-op evaluation,  At surgeon's request and post-op pain management  Laterality: Upper and Right  Prep: Maximum Sterile Barrier Precautions used, chloraprep       Needles:  Injection technique: Single-shot  Needle Type: Echogenic Needle     Needle Length: 5cm  Needle Gauge: 21     Additional Needles:   Procedures:,,,, ultrasound used (permanent image in chart),,,,  Narrative:  Start time: 03/22/2019 8:06 AM End time: 03/22/2019 8:12 AM Injection made incrementally with aspirations every 5 mL.  Performed by: Personally  Anesthesiologist: Barnet Glasgow, MD  Additional Notes: Block assessed prior to procedure. Patient tolerated procedure well.

## 2019-03-23 ENCOUNTER — Encounter: Payer: Self-pay | Admitting: *Deleted

## 2019-03-27 ENCOUNTER — Other Ambulatory Visit (INDEPENDENT_AMBULATORY_CARE_PROVIDER_SITE_OTHER): Payer: Self-pay | Admitting: *Deleted

## 2019-03-27 DIAGNOSIS — B182 Chronic viral hepatitis C: Secondary | ICD-10-CM

## 2019-07-28 ENCOUNTER — Other Ambulatory Visit: Payer: Self-pay

## 2019-07-28 ENCOUNTER — Ambulatory Visit (INDEPENDENT_AMBULATORY_CARE_PROVIDER_SITE_OTHER): Payer: 59 | Admitting: Physician Assistant

## 2019-07-28 ENCOUNTER — Encounter: Payer: Self-pay | Admitting: Physician Assistant

## 2019-07-28 DIAGNOSIS — L659 Nonscarring hair loss, unspecified: Secondary | ICD-10-CM

## 2019-07-28 NOTE — Progress Notes (Signed)
   Follow up Visit  Subjective  Amber Peterson is a 64 y.o. female who presents for the following: Hairloss (Hair thinning along sides and on top of crown. She states that she stays stressed and is on multiple medications to help manage.  She does know the thinning and loss increased over the last year.  Labs done in December 2020; however, no thyroid panel or vitamin D.  Has not tried any OTC products.). She feels her hair has been thinning for about 1 year. Noticing way more hair in the drain. Felt she noticed a difference in her pony tail. She is not having any scaling, itching or bumps on her scalp. Had a full hysterectomy at 1. She had premarin for a very short period of time after. She feels like her diet is not good. She has been doing collagen shakes and protein shakes.   Objective  Well appearing patient in no apparent distress; mood and affect are within normal limits.  A focused examination was performed including scalp. Relevant physical exam findings are noted in the Assessment and Plan. Her scalp appears normal. Her pull test was negative. She has a widened part width over her entire scalp.      Assessment & Plan  Nonscarring hair loss Scalp  Other Related Procedures Iron Ferritin CBC with Differential/Platelets TSH T4, Free Zinc Vitamin D, 25-hydroxy TgAb+Thyroglobulin IMA or RIA  She can start Biotin 3.5-5 mg. She can start OTC Minoxidil topically. We will review her meds to see if any may contribute to her loss. Her meloxicam, trazadone and effexor all list alopecia as possible side effect-uncommon.

## 2019-07-29 LAB — TGAB+THYROGLOBULIN IMA OR RIA: Thyroglobulin Antibody: <1 IU/mL (ref 0.0–0.9)

## 2019-07-29 LAB — THYROGLOBULIN BY IMA: Thyroglobulin by IMA: 13.1 ng/mL (ref 1.5–38.5)

## 2019-07-31 ENCOUNTER — Telehealth: Payer: Self-pay

## 2019-07-31 LAB — CBC WITH DIFFERENTIAL/PLATELET
Absolute Monocytes: 412 cells/uL (ref 200–950)
Basophils Absolute: 20 cells/uL (ref 0–200)
Basophils Relative: 0.4 %
Eosinophils Absolute: 132 cells/uL (ref 15–500)
Eosinophils Relative: 2.7 %
HCT: 40.6 % (ref 35.0–45.0)
Hemoglobin: 13.9 g/dL (ref 11.7–15.5)
Lymphs Abs: 1186 cells/uL (ref 850–3900)
MCH: 33 pg (ref 27.0–33.0)
MCHC: 34.2 g/dL (ref 32.0–36.0)
MCV: 96.4 fL (ref 80.0–100.0)
MPV: 10.4 fL (ref 7.5–12.5)
Monocytes Relative: 8.4 %
Neutro Abs: 3151 cells/uL (ref 1500–7800)
Neutrophils Relative %: 64.3 %
Platelets: 112 10*3/uL — ABNORMAL LOW (ref 140–400)
RBC: 4.21 10*6/uL (ref 3.80–5.10)
RDW: 14.7 % (ref 11.0–15.0)
Total Lymphocyte: 24.2 %
WBC: 4.9 10*3/uL (ref 3.8–10.8)

## 2019-07-31 LAB — IRON: Iron: 150 ug/dL (ref 45–160)

## 2019-07-31 LAB — ZINC: Zinc: 59 ug/dL — ABNORMAL LOW (ref 60–130)

## 2019-07-31 LAB — FERRITIN: Ferritin: 99 ng/mL (ref 16–288)

## 2019-07-31 LAB — TSH: TSH: 4.92 mIU/L — ABNORMAL HIGH (ref 0.40–4.50)

## 2019-07-31 LAB — VITAMIN D 25 HYDROXY (VIT D DEFICIENCY, FRACTURES): Vit D, 25-Hydroxy: 30 ng/mL (ref 30–100)

## 2019-07-31 LAB — T4, FREE: Free T4: 0.9 ng/dL (ref 0.8–1.8)

## 2019-07-31 NOTE — Telephone Encounter (Signed)
This encounter was created in error - please disregard.

## 2019-07-31 NOTE — Telephone Encounter (Signed)
Phone call to patient with her Lab results and Anderson Malta Clark-Bruning's recommendations. Patient aware of lab results, patient states that her PCP has retired but she will get one to follow up on her TSH.

## 2019-07-31 NOTE — Telephone Encounter (Signed)
-----   Message from Amber Peterson, [place] sent at 07/31/2019 11:28 AM EDT ----- Her TSH is high which could be a reason for her hair loss. Also incidentally her platelets are low and her zinc is slighlty low. Her iron is normal. She needs to follow up with her PCP for the elevated TSH.

## 2019-09-27 ENCOUNTER — Ambulatory Visit: Payer: 59 | Admitting: Physician Assistant

## 2019-10-30 ENCOUNTER — Ambulatory Visit: Payer: 59 | Admitting: Physician Assistant

## 2020-01-19 DIAGNOSIS — I10 Essential (primary) hypertension: Secondary | ICD-10-CM | POA: Diagnosis not present

## 2020-01-19 DIAGNOSIS — E039 Hypothyroidism, unspecified: Secondary | ICD-10-CM | POA: Diagnosis not present

## 2020-01-19 DIAGNOSIS — Z0000 Encounter for general adult medical examination without abnormal findings: Secondary | ICD-10-CM | POA: Diagnosis not present

## 2020-01-19 DIAGNOSIS — E785 Hyperlipidemia, unspecified: Secondary | ICD-10-CM | POA: Diagnosis not present

## 2020-01-19 DIAGNOSIS — G4700 Insomnia, unspecified: Secondary | ICD-10-CM | POA: Diagnosis not present

## 2020-01-23 DIAGNOSIS — D751 Secondary polycythemia: Secondary | ICD-10-CM | POA: Diagnosis not present

## 2020-01-23 DIAGNOSIS — E039 Hypothyroidism, unspecified: Secondary | ICD-10-CM | POA: Diagnosis not present

## 2020-01-23 DIAGNOSIS — E785 Hyperlipidemia, unspecified: Secondary | ICD-10-CM | POA: Diagnosis not present

## 2020-01-23 DIAGNOSIS — N1831 Chronic kidney disease, stage 3a: Secondary | ICD-10-CM | POA: Diagnosis not present

## 2020-01-23 DIAGNOSIS — D696 Thrombocytopenia, unspecified: Secondary | ICD-10-CM | POA: Diagnosis not present

## 2020-02-02 DIAGNOSIS — R7301 Impaired fasting glucose: Secondary | ICD-10-CM | POA: Diagnosis not present

## 2020-02-02 DIAGNOSIS — Z0001 Encounter for general adult medical examination with abnormal findings: Secondary | ICD-10-CM | POA: Diagnosis not present

## 2020-02-02 DIAGNOSIS — E782 Mixed hyperlipidemia: Secondary | ICD-10-CM | POA: Diagnosis not present

## 2020-02-06 DIAGNOSIS — D751 Secondary polycythemia: Secondary | ICD-10-CM | POA: Diagnosis not present

## 2020-02-06 DIAGNOSIS — D696 Thrombocytopenia, unspecified: Secondary | ICD-10-CM | POA: Diagnosis not present

## 2020-02-06 DIAGNOSIS — N1831 Chronic kidney disease, stage 3a: Secondary | ICD-10-CM | POA: Diagnosis not present

## 2020-02-06 DIAGNOSIS — E785 Hyperlipidemia, unspecified: Secondary | ICD-10-CM | POA: Diagnosis not present

## 2020-05-17 DIAGNOSIS — D696 Thrombocytopenia, unspecified: Secondary | ICD-10-CM | POA: Diagnosis not present

## 2020-05-17 DIAGNOSIS — Z712 Person consulting for explanation of examination or test findings: Secondary | ICD-10-CM | POA: Diagnosis not present

## 2020-05-17 DIAGNOSIS — E785 Hyperlipidemia, unspecified: Secondary | ICD-10-CM | POA: Diagnosis not present

## 2020-05-17 DIAGNOSIS — R7303 Prediabetes: Secondary | ICD-10-CM | POA: Diagnosis not present

## 2020-05-28 DIAGNOSIS — M5412 Radiculopathy, cervical region: Secondary | ICD-10-CM | POA: Diagnosis not present

## 2020-05-28 DIAGNOSIS — R7303 Prediabetes: Secondary | ICD-10-CM | POA: Diagnosis not present

## 2020-05-28 DIAGNOSIS — M542 Cervicalgia: Secondary | ICD-10-CM | POA: Diagnosis not present

## 2020-05-28 DIAGNOSIS — E785 Hyperlipidemia, unspecified: Secondary | ICD-10-CM | POA: Diagnosis not present

## 2020-05-28 DIAGNOSIS — D751 Secondary polycythemia: Secondary | ICD-10-CM | POA: Diagnosis not present

## 2020-05-28 DIAGNOSIS — D696 Thrombocytopenia, unspecified: Secondary | ICD-10-CM | POA: Diagnosis not present

## 2020-05-28 DIAGNOSIS — D519 Vitamin B12 deficiency anemia, unspecified: Secondary | ICD-10-CM | POA: Diagnosis not present

## 2020-05-28 DIAGNOSIS — N1831 Chronic kidney disease, stage 3a: Secondary | ICD-10-CM | POA: Diagnosis not present

## 2020-05-28 DIAGNOSIS — M5451 Vertebrogenic low back pain: Secondary | ICD-10-CM | POA: Diagnosis not present

## 2020-05-30 ENCOUNTER — Other Ambulatory Visit (HOSPITAL_COMMUNITY): Payer: Self-pay | Admitting: Internal Medicine

## 2020-05-30 ENCOUNTER — Other Ambulatory Visit: Payer: Self-pay | Admitting: Student

## 2020-05-30 ENCOUNTER — Other Ambulatory Visit (HOSPITAL_COMMUNITY): Payer: Self-pay | Admitting: Student

## 2020-05-30 DIAGNOSIS — M5451 Vertebrogenic low back pain: Secondary | ICD-10-CM

## 2020-05-31 ENCOUNTER — Other Ambulatory Visit: Payer: Self-pay | Admitting: Student

## 2020-05-31 ENCOUNTER — Other Ambulatory Visit (HOSPITAL_COMMUNITY): Payer: Self-pay | Admitting: Student

## 2020-05-31 DIAGNOSIS — M5412 Radiculopathy, cervical region: Secondary | ICD-10-CM

## 2020-06-07 DIAGNOSIS — Z882 Allergy status to sulfonamides status: Secondary | ICD-10-CM | POA: Diagnosis not present

## 2020-06-07 DIAGNOSIS — I10 Essential (primary) hypertension: Secondary | ICD-10-CM | POA: Diagnosis not present

## 2020-06-07 DIAGNOSIS — W19XXXA Unspecified fall, initial encounter: Secondary | ICD-10-CM | POA: Diagnosis not present

## 2020-06-07 DIAGNOSIS — S82852A Displaced trimalleolar fracture of left lower leg, initial encounter for closed fracture: Secondary | ICD-10-CM | POA: Diagnosis not present

## 2020-06-07 DIAGNOSIS — Z043 Encounter for examination and observation following other accident: Secondary | ICD-10-CM | POA: Diagnosis not present

## 2020-06-07 DIAGNOSIS — Z79899 Other long term (current) drug therapy: Secondary | ICD-10-CM | POA: Diagnosis not present

## 2020-06-07 DIAGNOSIS — Z9911 Dependence on respirator [ventilator] status: Secondary | ICD-10-CM | POA: Diagnosis not present

## 2020-06-07 DIAGNOSIS — Z880 Allergy status to penicillin: Secondary | ICD-10-CM | POA: Diagnosis not present

## 2020-06-07 DIAGNOSIS — J45909 Unspecified asthma, uncomplicated: Secondary | ICD-10-CM | POA: Diagnosis not present

## 2020-06-07 DIAGNOSIS — S0990XA Unspecified injury of head, initial encounter: Secondary | ICD-10-CM | POA: Diagnosis not present

## 2020-06-07 DIAGNOSIS — F32A Depression, unspecified: Secondary | ICD-10-CM | POA: Diagnosis not present

## 2020-06-07 DIAGNOSIS — S82855A Nondisplaced trimalleolar fracture of left lower leg, initial encounter for closed fracture: Secondary | ICD-10-CM | POA: Diagnosis not present

## 2020-06-07 DIAGNOSIS — Z20822 Contact with and (suspected) exposure to covid-19: Secondary | ICD-10-CM | POA: Diagnosis not present

## 2020-06-07 DIAGNOSIS — S82854A Nondisplaced trimalleolar fracture of right lower leg, initial encounter for closed fracture: Secondary | ICD-10-CM | POA: Diagnosis not present

## 2020-06-07 DIAGNOSIS — Z4682 Encounter for fitting and adjustment of non-vascular catheter: Secondary | ICD-10-CM | POA: Diagnosis not present

## 2020-06-07 DIAGNOSIS — S060X9A Concussion with loss of consciousness of unspecified duration, initial encounter: Secondary | ICD-10-CM | POA: Diagnosis not present

## 2020-06-07 DIAGNOSIS — S82851A Displaced trimalleolar fracture of right lower leg, initial encounter for closed fracture: Secondary | ICD-10-CM | POA: Diagnosis not present

## 2020-06-08 DIAGNOSIS — Z4682 Encounter for fitting and adjustment of non-vascular catheter: Secondary | ICD-10-CM | POA: Diagnosis not present

## 2020-06-08 DIAGNOSIS — Z9911 Dependence on respirator [ventilator] status: Secondary | ICD-10-CM | POA: Diagnosis not present

## 2020-06-12 ENCOUNTER — Ambulatory Visit (HOSPITAL_COMMUNITY): Payer: Medicare Other

## 2020-06-12 ENCOUNTER — Encounter (HOSPITAL_COMMUNITY): Payer: Self-pay

## 2020-06-13 DIAGNOSIS — M25572 Pain in left ankle and joints of left foot: Secondary | ICD-10-CM | POA: Diagnosis not present

## 2020-06-13 DIAGNOSIS — Z9189 Other specified personal risk factors, not elsewhere classified: Secondary | ICD-10-CM | POA: Diagnosis not present

## 2020-06-13 DIAGNOSIS — Z1159 Encounter for screening for other viral diseases: Secondary | ICD-10-CM | POA: Diagnosis not present

## 2020-06-14 DIAGNOSIS — S8251XA Displaced fracture of medial malleolus of right tibia, initial encounter for closed fracture: Secondary | ICD-10-CM | POA: Diagnosis not present

## 2020-06-14 DIAGNOSIS — M7989 Other specified soft tissue disorders: Secondary | ICD-10-CM | POA: Diagnosis not present

## 2020-06-14 DIAGNOSIS — S82851A Displaced trimalleolar fracture of right lower leg, initial encounter for closed fracture: Secondary | ICD-10-CM | POA: Diagnosis not present

## 2020-06-17 DIAGNOSIS — S82855A Nondisplaced trimalleolar fracture of left lower leg, initial encounter for closed fracture: Secondary | ICD-10-CM | POA: Diagnosis not present

## 2020-06-17 DIAGNOSIS — S82852A Displaced trimalleolar fracture of left lower leg, initial encounter for closed fracture: Secondary | ICD-10-CM | POA: Diagnosis not present

## 2020-06-17 DIAGNOSIS — G8918 Other acute postprocedural pain: Secondary | ICD-10-CM | POA: Diagnosis not present

## 2020-06-25 DIAGNOSIS — S82855D Nondisplaced trimalleolar fracture of left lower leg, subsequent encounter for closed fracture with routine healing: Secondary | ICD-10-CM | POA: Diagnosis not present

## 2020-07-16 DIAGNOSIS — S82855D Nondisplaced trimalleolar fracture of left lower leg, subsequent encounter for closed fracture with routine healing: Secondary | ICD-10-CM | POA: Diagnosis not present

## 2020-07-17 DIAGNOSIS — M25672 Stiffness of left ankle, not elsewhere classified: Secondary | ICD-10-CM | POA: Diagnosis not present

## 2020-07-17 DIAGNOSIS — M25572 Pain in left ankle and joints of left foot: Secondary | ICD-10-CM | POA: Diagnosis not present

## 2020-07-17 DIAGNOSIS — S82852S Displaced trimalleolar fracture of left lower leg, sequela: Secondary | ICD-10-CM | POA: Diagnosis not present

## 2020-07-17 DIAGNOSIS — R2689 Other abnormalities of gait and mobility: Secondary | ICD-10-CM | POA: Diagnosis not present

## 2020-07-19 DIAGNOSIS — S82852S Displaced trimalleolar fracture of left lower leg, sequela: Secondary | ICD-10-CM | POA: Diagnosis not present

## 2020-07-19 DIAGNOSIS — R2689 Other abnormalities of gait and mobility: Secondary | ICD-10-CM | POA: Diagnosis not present

## 2020-07-19 DIAGNOSIS — M25572 Pain in left ankle and joints of left foot: Secondary | ICD-10-CM | POA: Diagnosis not present

## 2020-07-19 DIAGNOSIS — M25672 Stiffness of left ankle, not elsewhere classified: Secondary | ICD-10-CM | POA: Diagnosis not present

## 2020-07-22 DIAGNOSIS — S82852S Displaced trimalleolar fracture of left lower leg, sequela: Secondary | ICD-10-CM | POA: Diagnosis not present

## 2020-07-22 DIAGNOSIS — R2689 Other abnormalities of gait and mobility: Secondary | ICD-10-CM | POA: Diagnosis not present

## 2020-07-22 DIAGNOSIS — M25672 Stiffness of left ankle, not elsewhere classified: Secondary | ICD-10-CM | POA: Diagnosis not present

## 2020-07-22 DIAGNOSIS — M25572 Pain in left ankle and joints of left foot: Secondary | ICD-10-CM | POA: Diagnosis not present

## 2020-07-24 DIAGNOSIS — M25572 Pain in left ankle and joints of left foot: Secondary | ICD-10-CM | POA: Diagnosis not present

## 2020-07-24 DIAGNOSIS — R2689 Other abnormalities of gait and mobility: Secondary | ICD-10-CM | POA: Diagnosis not present

## 2020-07-24 DIAGNOSIS — S82852S Displaced trimalleolar fracture of left lower leg, sequela: Secondary | ICD-10-CM | POA: Diagnosis not present

## 2020-07-24 DIAGNOSIS — M25672 Stiffness of left ankle, not elsewhere classified: Secondary | ICD-10-CM | POA: Diagnosis not present

## 2020-07-26 DIAGNOSIS — R2689 Other abnormalities of gait and mobility: Secondary | ICD-10-CM | POA: Diagnosis not present

## 2020-07-26 DIAGNOSIS — M25672 Stiffness of left ankle, not elsewhere classified: Secondary | ICD-10-CM | POA: Diagnosis not present

## 2020-07-26 DIAGNOSIS — S82852S Displaced trimalleolar fracture of left lower leg, sequela: Secondary | ICD-10-CM | POA: Diagnosis not present

## 2020-07-26 DIAGNOSIS — M25572 Pain in left ankle and joints of left foot: Secondary | ICD-10-CM | POA: Diagnosis not present

## 2020-07-29 DIAGNOSIS — R2689 Other abnormalities of gait and mobility: Secondary | ICD-10-CM | POA: Diagnosis not present

## 2020-07-29 DIAGNOSIS — S82852S Displaced trimalleolar fracture of left lower leg, sequela: Secondary | ICD-10-CM | POA: Diagnosis not present

## 2020-07-29 DIAGNOSIS — M25672 Stiffness of left ankle, not elsewhere classified: Secondary | ICD-10-CM | POA: Diagnosis not present

## 2020-07-29 DIAGNOSIS — M25572 Pain in left ankle and joints of left foot: Secondary | ICD-10-CM | POA: Diagnosis not present

## 2020-07-31 DIAGNOSIS — M25672 Stiffness of left ankle, not elsewhere classified: Secondary | ICD-10-CM | POA: Diagnosis not present

## 2020-07-31 DIAGNOSIS — R2689 Other abnormalities of gait and mobility: Secondary | ICD-10-CM | POA: Diagnosis not present

## 2020-07-31 DIAGNOSIS — S82852S Displaced trimalleolar fracture of left lower leg, sequela: Secondary | ICD-10-CM | POA: Diagnosis not present

## 2020-07-31 DIAGNOSIS — M25572 Pain in left ankle and joints of left foot: Secondary | ICD-10-CM | POA: Diagnosis not present

## 2020-08-02 DIAGNOSIS — M25672 Stiffness of left ankle, not elsewhere classified: Secondary | ICD-10-CM | POA: Diagnosis not present

## 2020-08-02 DIAGNOSIS — R2689 Other abnormalities of gait and mobility: Secondary | ICD-10-CM | POA: Diagnosis not present

## 2020-08-02 DIAGNOSIS — M25572 Pain in left ankle and joints of left foot: Secondary | ICD-10-CM | POA: Diagnosis not present

## 2020-08-02 DIAGNOSIS — S82852S Displaced trimalleolar fracture of left lower leg, sequela: Secondary | ICD-10-CM | POA: Diagnosis not present

## 2020-08-05 DIAGNOSIS — M25572 Pain in left ankle and joints of left foot: Secondary | ICD-10-CM | POA: Diagnosis not present

## 2020-08-05 DIAGNOSIS — M25672 Stiffness of left ankle, not elsewhere classified: Secondary | ICD-10-CM | POA: Diagnosis not present

## 2020-08-05 DIAGNOSIS — R2689 Other abnormalities of gait and mobility: Secondary | ICD-10-CM | POA: Diagnosis not present

## 2020-08-05 DIAGNOSIS — S82852S Displaced trimalleolar fracture of left lower leg, sequela: Secondary | ICD-10-CM | POA: Diagnosis not present

## 2020-08-07 DIAGNOSIS — S82852S Displaced trimalleolar fracture of left lower leg, sequela: Secondary | ICD-10-CM | POA: Diagnosis not present

## 2020-08-07 DIAGNOSIS — M25572 Pain in left ankle and joints of left foot: Secondary | ICD-10-CM | POA: Diagnosis not present

## 2020-08-07 DIAGNOSIS — M25672 Stiffness of left ankle, not elsewhere classified: Secondary | ICD-10-CM | POA: Diagnosis not present

## 2020-08-07 DIAGNOSIS — R2689 Other abnormalities of gait and mobility: Secondary | ICD-10-CM | POA: Diagnosis not present

## 2020-08-09 DIAGNOSIS — M25572 Pain in left ankle and joints of left foot: Secondary | ICD-10-CM | POA: Diagnosis not present

## 2020-08-09 DIAGNOSIS — M25672 Stiffness of left ankle, not elsewhere classified: Secondary | ICD-10-CM | POA: Diagnosis not present

## 2020-08-09 DIAGNOSIS — S82852S Displaced trimalleolar fracture of left lower leg, sequela: Secondary | ICD-10-CM | POA: Diagnosis not present

## 2020-08-09 DIAGNOSIS — R2689 Other abnormalities of gait and mobility: Secondary | ICD-10-CM | POA: Diagnosis not present

## 2020-08-12 DIAGNOSIS — R2689 Other abnormalities of gait and mobility: Secondary | ICD-10-CM | POA: Diagnosis not present

## 2020-08-12 DIAGNOSIS — M25572 Pain in left ankle and joints of left foot: Secondary | ICD-10-CM | POA: Diagnosis not present

## 2020-08-12 DIAGNOSIS — M25672 Stiffness of left ankle, not elsewhere classified: Secondary | ICD-10-CM | POA: Diagnosis not present

## 2020-08-12 DIAGNOSIS — S82852S Displaced trimalleolar fracture of left lower leg, sequela: Secondary | ICD-10-CM | POA: Diagnosis not present

## 2020-08-13 DIAGNOSIS — S82855D Nondisplaced trimalleolar fracture of left lower leg, subsequent encounter for closed fracture with routine healing: Secondary | ICD-10-CM | POA: Diagnosis not present

## 2020-08-14 DIAGNOSIS — M25572 Pain in left ankle and joints of left foot: Secondary | ICD-10-CM | POA: Diagnosis not present

## 2020-08-14 DIAGNOSIS — M25672 Stiffness of left ankle, not elsewhere classified: Secondary | ICD-10-CM | POA: Diagnosis not present

## 2020-08-14 DIAGNOSIS — S82852S Displaced trimalleolar fracture of left lower leg, sequela: Secondary | ICD-10-CM | POA: Diagnosis not present

## 2020-08-14 DIAGNOSIS — R2689 Other abnormalities of gait and mobility: Secondary | ICD-10-CM | POA: Diagnosis not present

## 2020-08-19 DIAGNOSIS — R2689 Other abnormalities of gait and mobility: Secondary | ICD-10-CM | POA: Diagnosis not present

## 2020-08-19 DIAGNOSIS — S82852S Displaced trimalleolar fracture of left lower leg, sequela: Secondary | ICD-10-CM | POA: Diagnosis not present

## 2020-08-19 DIAGNOSIS — M25572 Pain in left ankle and joints of left foot: Secondary | ICD-10-CM | POA: Diagnosis not present

## 2020-08-19 DIAGNOSIS — M25672 Stiffness of left ankle, not elsewhere classified: Secondary | ICD-10-CM | POA: Diagnosis not present

## 2020-08-21 DIAGNOSIS — M25672 Stiffness of left ankle, not elsewhere classified: Secondary | ICD-10-CM | POA: Diagnosis not present

## 2020-08-21 DIAGNOSIS — M25572 Pain in left ankle and joints of left foot: Secondary | ICD-10-CM | POA: Diagnosis not present

## 2020-08-21 DIAGNOSIS — S82852S Displaced trimalleolar fracture of left lower leg, sequela: Secondary | ICD-10-CM | POA: Diagnosis not present

## 2020-08-21 DIAGNOSIS — R2689 Other abnormalities of gait and mobility: Secondary | ICD-10-CM | POA: Diagnosis not present

## 2020-08-23 DIAGNOSIS — S82852S Displaced trimalleolar fracture of left lower leg, sequela: Secondary | ICD-10-CM | POA: Diagnosis not present

## 2020-08-23 DIAGNOSIS — R2689 Other abnormalities of gait and mobility: Secondary | ICD-10-CM | POA: Diagnosis not present

## 2020-08-23 DIAGNOSIS — M25572 Pain in left ankle and joints of left foot: Secondary | ICD-10-CM | POA: Diagnosis not present

## 2020-08-23 DIAGNOSIS — R7303 Prediabetes: Secondary | ICD-10-CM | POA: Diagnosis not present

## 2020-08-23 DIAGNOSIS — Z712 Person consulting for explanation of examination or test findings: Secondary | ICD-10-CM | POA: Diagnosis not present

## 2020-08-23 DIAGNOSIS — M5412 Radiculopathy, cervical region: Secondary | ICD-10-CM | POA: Diagnosis not present

## 2020-08-23 DIAGNOSIS — M25672 Stiffness of left ankle, not elsewhere classified: Secondary | ICD-10-CM | POA: Diagnosis not present

## 2020-08-23 DIAGNOSIS — E785 Hyperlipidemia, unspecified: Secondary | ICD-10-CM | POA: Diagnosis not present

## 2020-08-23 DIAGNOSIS — D519 Vitamin B12 deficiency anemia, unspecified: Secondary | ICD-10-CM | POA: Diagnosis not present

## 2020-08-23 DIAGNOSIS — N1831 Chronic kidney disease, stage 3a: Secondary | ICD-10-CM | POA: Diagnosis not present

## 2020-08-23 DIAGNOSIS — D696 Thrombocytopenia, unspecified: Secondary | ICD-10-CM | POA: Diagnosis not present

## 2020-08-23 DIAGNOSIS — D751 Secondary polycythemia: Secondary | ICD-10-CM | POA: Diagnosis not present

## 2020-08-26 DIAGNOSIS — M25672 Stiffness of left ankle, not elsewhere classified: Secondary | ICD-10-CM | POA: Diagnosis not present

## 2020-08-26 DIAGNOSIS — S82852S Displaced trimalleolar fracture of left lower leg, sequela: Secondary | ICD-10-CM | POA: Diagnosis not present

## 2020-08-26 DIAGNOSIS — R2689 Other abnormalities of gait and mobility: Secondary | ICD-10-CM | POA: Diagnosis not present

## 2020-08-26 DIAGNOSIS — M25572 Pain in left ankle and joints of left foot: Secondary | ICD-10-CM | POA: Diagnosis not present

## 2020-08-28 DIAGNOSIS — D696 Thrombocytopenia, unspecified: Secondary | ICD-10-CM | POA: Diagnosis not present

## 2020-08-28 DIAGNOSIS — G894 Chronic pain syndrome: Secondary | ICD-10-CM | POA: Diagnosis not present

## 2020-08-28 DIAGNOSIS — N1831 Chronic kidney disease, stage 3a: Secondary | ICD-10-CM | POA: Diagnosis not present

## 2020-08-28 DIAGNOSIS — R7303 Prediabetes: Secondary | ICD-10-CM | POA: Diagnosis not present

## 2020-08-28 DIAGNOSIS — K219 Gastro-esophageal reflux disease without esophagitis: Secondary | ICD-10-CM | POA: Diagnosis not present

## 2020-08-28 DIAGNOSIS — F32A Depression, unspecified: Secondary | ICD-10-CM | POA: Diagnosis not present

## 2020-08-28 DIAGNOSIS — D751 Secondary polycythemia: Secondary | ICD-10-CM | POA: Diagnosis not present

## 2020-08-28 DIAGNOSIS — E0590 Thyrotoxicosis, unspecified without thyrotoxic crisis or storm: Secondary | ICD-10-CM | POA: Diagnosis not present

## 2020-08-28 DIAGNOSIS — E782 Mixed hyperlipidemia: Secondary | ICD-10-CM | POA: Diagnosis not present

## 2020-08-29 DIAGNOSIS — M25572 Pain in left ankle and joints of left foot: Secondary | ICD-10-CM | POA: Diagnosis not present

## 2020-08-29 DIAGNOSIS — R2689 Other abnormalities of gait and mobility: Secondary | ICD-10-CM | POA: Diagnosis not present

## 2020-08-29 DIAGNOSIS — M25672 Stiffness of left ankle, not elsewhere classified: Secondary | ICD-10-CM | POA: Diagnosis not present

## 2020-08-29 DIAGNOSIS — S82852S Displaced trimalleolar fracture of left lower leg, sequela: Secondary | ICD-10-CM | POA: Diagnosis not present

## 2020-08-30 DIAGNOSIS — S82852S Displaced trimalleolar fracture of left lower leg, sequela: Secondary | ICD-10-CM | POA: Diagnosis not present

## 2020-08-30 DIAGNOSIS — M25572 Pain in left ankle and joints of left foot: Secondary | ICD-10-CM | POA: Diagnosis not present

## 2020-08-30 DIAGNOSIS — R2689 Other abnormalities of gait and mobility: Secondary | ICD-10-CM | POA: Diagnosis not present

## 2020-08-30 DIAGNOSIS — M25672 Stiffness of left ankle, not elsewhere classified: Secondary | ICD-10-CM | POA: Diagnosis not present

## 2020-09-10 DIAGNOSIS — S82852S Displaced trimalleolar fracture of left lower leg, sequela: Secondary | ICD-10-CM | POA: Diagnosis not present

## 2020-09-10 DIAGNOSIS — M25672 Stiffness of left ankle, not elsewhere classified: Secondary | ICD-10-CM | POA: Diagnosis not present

## 2020-09-10 DIAGNOSIS — R2689 Other abnormalities of gait and mobility: Secondary | ICD-10-CM | POA: Diagnosis not present

## 2020-09-10 DIAGNOSIS — M25572 Pain in left ankle and joints of left foot: Secondary | ICD-10-CM | POA: Diagnosis not present

## 2020-09-12 DIAGNOSIS — S82852S Displaced trimalleolar fracture of left lower leg, sequela: Secondary | ICD-10-CM | POA: Diagnosis not present

## 2020-09-12 DIAGNOSIS — R2689 Other abnormalities of gait and mobility: Secondary | ICD-10-CM | POA: Diagnosis not present

## 2020-09-12 DIAGNOSIS — M25572 Pain in left ankle and joints of left foot: Secondary | ICD-10-CM | POA: Diagnosis not present

## 2020-09-12 DIAGNOSIS — M25672 Stiffness of left ankle, not elsewhere classified: Secondary | ICD-10-CM | POA: Diagnosis not present

## 2020-09-17 DIAGNOSIS — M25672 Stiffness of left ankle, not elsewhere classified: Secondary | ICD-10-CM | POA: Diagnosis not present

## 2020-09-17 DIAGNOSIS — R2689 Other abnormalities of gait and mobility: Secondary | ICD-10-CM | POA: Diagnosis not present

## 2020-09-17 DIAGNOSIS — S82852S Displaced trimalleolar fracture of left lower leg, sequela: Secondary | ICD-10-CM | POA: Diagnosis not present

## 2020-09-17 DIAGNOSIS — M25572 Pain in left ankle and joints of left foot: Secondary | ICD-10-CM | POA: Diagnosis not present

## 2020-09-19 DIAGNOSIS — S82852S Displaced trimalleolar fracture of left lower leg, sequela: Secondary | ICD-10-CM | POA: Diagnosis not present

## 2020-09-19 DIAGNOSIS — M25672 Stiffness of left ankle, not elsewhere classified: Secondary | ICD-10-CM | POA: Diagnosis not present

## 2020-09-19 DIAGNOSIS — R2689 Other abnormalities of gait and mobility: Secondary | ICD-10-CM | POA: Diagnosis not present

## 2020-09-19 DIAGNOSIS — M25572 Pain in left ankle and joints of left foot: Secondary | ICD-10-CM | POA: Diagnosis not present

## 2020-09-23 DIAGNOSIS — M25672 Stiffness of left ankle, not elsewhere classified: Secondary | ICD-10-CM | POA: Diagnosis not present

## 2020-09-23 DIAGNOSIS — S82852S Displaced trimalleolar fracture of left lower leg, sequela: Secondary | ICD-10-CM | POA: Diagnosis not present

## 2020-09-23 DIAGNOSIS — M25572 Pain in left ankle and joints of left foot: Secondary | ICD-10-CM | POA: Diagnosis not present

## 2020-09-23 DIAGNOSIS — R2689 Other abnormalities of gait and mobility: Secondary | ICD-10-CM | POA: Diagnosis not present

## 2020-09-24 DIAGNOSIS — S82855D Nondisplaced trimalleolar fracture of left lower leg, subsequent encounter for closed fracture with routine healing: Secondary | ICD-10-CM | POA: Diagnosis not present

## 2020-11-29 DIAGNOSIS — I10 Essential (primary) hypertension: Secondary | ICD-10-CM | POA: Diagnosis not present

## 2020-11-29 DIAGNOSIS — D519 Vitamin B12 deficiency anemia, unspecified: Secondary | ICD-10-CM | POA: Diagnosis not present

## 2020-11-29 DIAGNOSIS — E559 Vitamin D deficiency, unspecified: Secondary | ICD-10-CM | POA: Diagnosis not present

## 2020-12-08 ENCOUNTER — Other Ambulatory Visit: Payer: Self-pay

## 2020-12-08 ENCOUNTER — Encounter (HOSPITAL_COMMUNITY): Payer: Self-pay

## 2020-12-08 ENCOUNTER — Encounter (HOSPITAL_COMMUNITY)
Admission: EM | Disposition: A | Discharge status: Home / Self Care | Payer: Self-pay | Source: Home / Self Care | Attending: Cardiology

## 2020-12-08 ENCOUNTER — Emergency Department (HOSPITAL_COMMUNITY): Payer: Medicare Other

## 2020-12-08 ENCOUNTER — Inpatient Hospital Stay (HOSPITAL_COMMUNITY): Payer: Medicare Other

## 2020-12-08 ENCOUNTER — Inpatient Hospital Stay (HOSPITAL_COMMUNITY)
Admission: EM | Admit: 2020-12-08 | Discharge: 2020-12-12 | DRG: 247 | Disposition: A | Discharge status: Home / Self Care | Payer: Medicare Other | Attending: Cardiology | Admitting: Cardiology

## 2020-12-08 DIAGNOSIS — I10 Essential (primary) hypertension: Secondary | ICD-10-CM | POA: Diagnosis not present

## 2020-12-08 DIAGNOSIS — Z91013 Allergy to seafood: Secondary | ICD-10-CM | POA: Diagnosis not present

## 2020-12-08 DIAGNOSIS — I2109 ST elevation (STEMI) myocardial infarction involving other coronary artery of anterior wall: Secondary | ICD-10-CM | POA: Diagnosis present

## 2020-12-08 DIAGNOSIS — D500 Iron deficiency anemia secondary to blood loss (chronic): Secondary | ICD-10-CM | POA: Diagnosis not present

## 2020-12-08 DIAGNOSIS — Z96611 Presence of right artificial shoulder joint: Secondary | ICD-10-CM | POA: Diagnosis present

## 2020-12-08 DIAGNOSIS — K921 Melena: Secondary | ICD-10-CM | POA: Diagnosis present

## 2020-12-08 DIAGNOSIS — E119 Type 2 diabetes mellitus without complications: Secondary | ICD-10-CM | POA: Diagnosis not present

## 2020-12-08 DIAGNOSIS — Z79899 Other long term (current) drug therapy: Secondary | ICD-10-CM

## 2020-12-08 DIAGNOSIS — I2510 Atherosclerotic heart disease of native coronary artery without angina pectoris: Secondary | ICD-10-CM | POA: Diagnosis not present

## 2020-12-08 DIAGNOSIS — Z20822 Contact with and (suspected) exposure to covid-19: Secondary | ICD-10-CM | POA: Diagnosis present

## 2020-12-08 DIAGNOSIS — E785 Hyperlipidemia, unspecified: Secondary | ICD-10-CM | POA: Diagnosis present

## 2020-12-08 DIAGNOSIS — Z79890 Hormone replacement therapy (postmenopausal): Secondary | ICD-10-CM | POA: Diagnosis not present

## 2020-12-08 DIAGNOSIS — Z7982 Long term (current) use of aspirin: Secondary | ICD-10-CM

## 2020-12-08 DIAGNOSIS — F32A Depression, unspecified: Secondary | ICD-10-CM | POA: Diagnosis present

## 2020-12-08 DIAGNOSIS — I213 ST elevation (STEMI) myocardial infarction of unspecified site: Secondary | ICD-10-CM

## 2020-12-08 DIAGNOSIS — Z90710 Acquired absence of both cervix and uterus: Secondary | ICD-10-CM | POA: Diagnosis not present

## 2020-12-08 DIAGNOSIS — Z955 Presence of coronary angioplasty implant and graft: Secondary | ICD-10-CM | POA: Diagnosis not present

## 2020-12-08 DIAGNOSIS — I2102 ST elevation (STEMI) myocardial infarction involving left anterior descending coronary artery: Secondary | ICD-10-CM | POA: Diagnosis not present

## 2020-12-08 DIAGNOSIS — I313 Pericardial effusion (noninflammatory): Secondary | ICD-10-CM | POA: Diagnosis not present

## 2020-12-08 DIAGNOSIS — I25110 Atherosclerotic heart disease of native coronary artery with unstable angina pectoris: Secondary | ICD-10-CM | POA: Diagnosis not present

## 2020-12-08 DIAGNOSIS — Z7951 Long term (current) use of inhaled steroids: Secondary | ICD-10-CM

## 2020-12-08 DIAGNOSIS — E039 Hypothyroidism, unspecified: Secondary | ICD-10-CM | POA: Diagnosis present

## 2020-12-08 DIAGNOSIS — J45909 Unspecified asthma, uncomplicated: Secondary | ICD-10-CM | POA: Diagnosis present

## 2020-12-08 DIAGNOSIS — F411 Generalized anxiety disorder: Secondary | ICD-10-CM | POA: Diagnosis present

## 2020-12-08 DIAGNOSIS — Z743 Need for continuous supervision: Secondary | ICD-10-CM | POA: Diagnosis not present

## 2020-12-08 DIAGNOSIS — R6889 Other general symptoms and signs: Secondary | ICD-10-CM | POA: Diagnosis not present

## 2020-12-08 DIAGNOSIS — I499 Cardiac arrhythmia, unspecified: Secondary | ICD-10-CM | POA: Diagnosis not present

## 2020-12-08 DIAGNOSIS — R079 Chest pain, unspecified: Secondary | ICD-10-CM | POA: Diagnosis not present

## 2020-12-08 DIAGNOSIS — I517 Cardiomegaly: Secondary | ICD-10-CM | POA: Diagnosis not present

## 2020-12-08 DIAGNOSIS — I255 Ischemic cardiomyopathy: Secondary | ICD-10-CM | POA: Diagnosis not present

## 2020-12-08 DIAGNOSIS — R0789 Other chest pain: Secondary | ICD-10-CM | POA: Diagnosis present

## 2020-12-08 DIAGNOSIS — M549 Dorsalgia, unspecified: Secondary | ICD-10-CM | POA: Diagnosis not present

## 2020-12-08 HISTORY — PX: CORONARY/GRAFT ACUTE MI REVASCULARIZATION: CATH118305

## 2020-12-08 HISTORY — DX: Atherosclerotic heart disease of native coronary artery with unstable angina pectoris: I25.110

## 2020-12-08 HISTORY — PX: CORONARY STENT INTERVENTION: CATH118234

## 2020-12-08 HISTORY — DX: ST elevation (STEMI) myocardial infarction involving left anterior descending coronary artery: I21.02

## 2020-12-08 HISTORY — DX: Atherosclerotic heart disease of native coronary artery without angina pectoris: I25.10

## 2020-12-08 HISTORY — PX: LEFT HEART CATH AND CORONARY ANGIOGRAPHY: CATH118249

## 2020-12-08 LAB — CBC WITH DIFFERENTIAL/PLATELET
Abs Immature Granulocytes: 0.16 10*3/uL — ABNORMAL HIGH (ref 0.00–0.07)
Basophils Absolute: 0.1 10*3/uL (ref 0.0–0.1)
Basophils Relative: 1 %
Eosinophils Absolute: 0 10*3/uL (ref 0.0–0.5)
Eosinophils Relative: 0 %
HCT: 46.6 % — ABNORMAL HIGH (ref 36.0–46.0)
Hemoglobin: 17 g/dL — ABNORMAL HIGH (ref 12.0–15.0)
Immature Granulocytes: 1 %
Lymphocytes Relative: 10 %
Lymphs Abs: 1.9 10*3/uL (ref 0.7–4.0)
MCH: 34.9 pg — ABNORMAL HIGH (ref 26.0–34.0)
MCHC: 36.5 g/dL — ABNORMAL HIGH (ref 30.0–36.0)
MCV: 95.7 fL (ref 80.0–100.0)
Monocytes Absolute: 1 10*3/uL (ref 0.1–1.0)
Monocytes Relative: 5 %
Neutro Abs: 16.8 10*3/uL — ABNORMAL HIGH (ref 1.7–7.7)
Neutrophils Relative %: 83 %
Platelets: 181 10*3/uL (ref 150–400)
RBC: 4.87 MIL/uL (ref 3.87–5.11)
RDW: 12.9 % (ref 11.5–15.5)
WBC: 20 10*3/uL — ABNORMAL HIGH (ref 4.0–10.5)
nRBC: 0 % (ref 0.0–0.2)

## 2020-12-08 LAB — LIPID PANEL
Cholesterol: 218 mg/dL — ABNORMAL HIGH (ref 0–200)
HDL: 56 mg/dL (ref >40)
LDL Cholesterol: 135 mg/dL — ABNORMAL HIGH (ref 0–99)
Total CHOL/HDL Ratio: 3.9 RATIO
Triglycerides: 135 mg/dL (ref <150)
VLDL: 27 mg/dL (ref 0–40)

## 2020-12-08 LAB — COMPREHENSIVE METABOLIC PANEL
ALT: 37 U/L (ref 0–44)
AST: 82 U/L — ABNORMAL HIGH (ref 15–41)
Albumin: 4.5 g/dL (ref 3.5–5.0)
Alkaline Phosphatase: 78 U/L (ref 38–126)
Anion gap: 9 (ref 5–15)
BUN: 8 mg/dL (ref 8–23)
CO2: 25 mmol/L (ref 22–32)
Calcium: 9.1 mg/dL (ref 8.9–10.3)
Chloride: 101 mmol/L (ref 98–111)
Creatinine, Ser: 0.74 mg/dL (ref 0.44–1.00)
GFR, Estimated: >60 mL/min (ref >60)
Glucose, Bld: 217 mg/dL — ABNORMAL HIGH (ref 70–99)
Potassium: 3.6 mmol/L (ref 3.5–5.1)
Sodium: 135 mmol/L (ref 135–145)
Total Bilirubin: 2.3 mg/dL — ABNORMAL HIGH (ref 0.3–1.2)
Total Protein: 8.5 g/dL — ABNORMAL HIGH (ref 6.5–8.1)

## 2020-12-08 LAB — RESP PANEL BY RT-PCR (FLU A&B, COVID) ARPGX2
Influenza A by PCR: NEGATIVE
Influenza B by PCR: NEGATIVE
SARS Coronavirus 2 by RT PCR: NEGATIVE

## 2020-12-08 LAB — TROPONIN I (HIGH SENSITIVITY)
Troponin I (High Sensitivity): 4575 ng/L (ref <18)
Troponin I (High Sensitivity): >24000 ng/L (ref <18)

## 2020-12-08 LAB — HEMOGLOBIN A1C
Hgb A1c MFr Bld: 5.6 % (ref 4.8–5.6)
Mean Plasma Glucose: 114.02 mg/dL

## 2020-12-08 LAB — PROTIME-INR
INR: 1.1 (ref 0.8–1.2)
Prothrombin Time: 14.4 seconds (ref 11.4–15.2)

## 2020-12-08 LAB — POCT ACTIVATED CLOTTING TIME: Activated Clotting Time: 387 seconds

## 2020-12-08 LAB — MRSA NEXT GEN BY PCR, NASAL: MRSA by PCR Next Gen: NOT DETECTED

## 2020-12-08 LAB — APTT: aPTT: 27 seconds (ref 24–36)

## 2020-12-08 SURGERY — CORONARY/GRAFT ACUTE MI REVASCULARIZATION
Anesthesia: LOCAL

## 2020-12-08 MED ORDER — NITROGLYCERIN 1 MG/10 ML FOR IR/CATH LAB
INTRA_ARTERIAL | Status: DC | PRN
  Administered 2020-12-08: 200 ug via INTRACORONARY

## 2020-12-08 MED ORDER — DIPHENHYDRAMINE HCL 50 MG/ML IJ SOLN
INTRAMUSCULAR | Status: AC
  Filled 2020-12-08: qty 1

## 2020-12-08 MED ORDER — METHYLPREDNISOLONE SODIUM SUCC 125 MG IJ SOLR
INTRAMUSCULAR | Status: AC
  Filled 2020-12-08: qty 2

## 2020-12-08 MED ORDER — NITROGLYCERIN IN D5W 200-5 MCG/ML-% IV SOLN
INTRAVENOUS | Status: AC | PRN
  Administered 2020-12-08: 5 ug/min via INTRAVENOUS

## 2020-12-08 MED ORDER — HEPARIN (PORCINE) 25000 UT/250ML-% IV SOLN: 750.0000 [IU]/h | INTRAVENOUS | Status: DC

## 2020-12-08 MED ORDER — HYDRALAZINE HCL 20 MG/ML IJ SOLN
10.0000 mg | INTRAMUSCULAR | Status: AC | PRN
  Administered 2020-12-08: 10 mg via INTRAVENOUS
  Filled 2020-12-08: qty 1

## 2020-12-08 MED ORDER — HEPARIN (PORCINE) IN NACL 1000-0.9 UT/500ML-% IV SOLN
INTRAVENOUS | Status: DC | PRN
Start: 2020-12-08 — End: 2020-12-08
  Administered 2020-12-08: 500 mL

## 2020-12-08 MED ORDER — TICAGRELOR 90 MG PO TABS
90.0000 mg | ORAL_TABLET | Freq: Two times a day (BID) | ORAL | Status: DC
  Administered 2020-12-08 – 2020-12-12 (×8): 90 mg via ORAL
  Filled 2020-12-08 (×8): qty 1

## 2020-12-08 MED ORDER — SODIUM CHLORIDE 0.9 % IV SOLN: 250.0000 mL | INTRAVENOUS | Status: DC | PRN

## 2020-12-08 MED ORDER — FENTANYL CITRATE (PF) 100 MCG/2ML IJ SOLN
INTRAMUSCULAR | Status: DC | PRN
  Administered 2020-12-08: 25 ug via INTRAVENOUS

## 2020-12-08 MED ORDER — CHLORHEXIDINE GLUCONATE CLOTH 2 % EX PADS
6.0000 | MEDICATED_PAD | Freq: Every day | CUTANEOUS | Status: DC
  Administered 2020-12-08 – 2020-12-10 (×3): 6 via TOPICAL

## 2020-12-08 MED ORDER — ACETAMINOPHEN 325 MG PO TABS
650.0000 mg | ORAL_TABLET | ORAL | Status: DC | PRN
  Administered 2020-12-09 – 2020-12-11 (×3): 650 mg via ORAL
  Filled 2020-12-08 (×3): qty 2

## 2020-12-08 MED ORDER — LIDOCAINE HCL (PF) 1 % IJ SOLN
INTRAMUSCULAR | Status: AC
  Filled 2020-12-08: qty 30

## 2020-12-08 MED ORDER — SODIUM CHLORIDE 0.9% FLUSH
3.0000 mL | Freq: Two times a day (BID) | INTRAVENOUS | Status: DC
  Administered 2020-12-08 – 2020-12-12 (×6): 3 mL via INTRAVENOUS

## 2020-12-08 MED ORDER — HEPARIN SODIUM (PORCINE) 5000 UNIT/ML IJ SOLN
60.0000 [IU]/kg | Freq: Once | INTRAMUSCULAR | Status: AC
  Administered 2020-12-08: 3650 [IU] via INTRAVENOUS
  Filled 2020-12-08: qty 1

## 2020-12-08 MED ORDER — SODIUM CHLORIDE 0.9 % IV SOLN: INTRAVENOUS | Status: DC

## 2020-12-08 MED ORDER — HEPARIN SODIUM (PORCINE) 1000 UNIT/ML IJ SOLN
INTRAMUSCULAR | Status: AC
  Filled 2020-12-08: qty 1

## 2020-12-08 MED ORDER — IOHEXOL 350 MG/ML SOLN
INTRAVENOUS | Status: DC | PRN
  Administered 2020-12-08: 130 mL

## 2020-12-08 MED ORDER — HEPARIN SODIUM (PORCINE) 1000 UNIT/ML IJ SOLN
INTRAMUSCULAR | Status: DC | PRN
  Administered 2020-12-08: 2000 [IU] via INTRAVENOUS
  Administered 2020-12-08: 2500 [IU] via INTRAVENOUS

## 2020-12-08 MED ORDER — LABETALOL HCL 5 MG/ML IV SOLN: 10.0000 mg | INTRAVENOUS | Status: AC | PRN

## 2020-12-08 MED ORDER — ASPIRIN 81 MG PO CHEW
324.0000 mg | CHEWABLE_TABLET | Freq: Once | ORAL | Status: AC
  Administered 2020-12-08: 324 mg via ORAL
  Filled 2020-12-08: qty 4

## 2020-12-08 MED ORDER — HEPARIN (PORCINE) IN NACL 1000-0.9 UT/500ML-% IV SOLN
INTRAVENOUS | Status: AC
  Filled 2020-12-08: qty 1000

## 2020-12-08 MED ORDER — ASPIRIN 81 MG PO CHEW
81.0000 mg | CHEWABLE_TABLET | Freq: Every day | ORAL | Status: DC
  Administered 2020-12-09: 81 mg via ORAL
  Filled 2020-12-08 (×2): qty 1

## 2020-12-08 MED ORDER — NITROGLYCERIN 0.4 MG SL SUBL
SUBLINGUAL_TABLET | SUBLINGUAL | Status: AC
  Filled 2020-12-08: qty 1

## 2020-12-08 MED ORDER — HEPARIN (PORCINE) IN NACL 1000-0.9 UT/500ML-% IV SOLN
INTRAVENOUS | Status: DC | PRN
  Administered 2020-12-08: 500 mL

## 2020-12-08 MED ORDER — METOPROLOL TARTRATE 25 MG PO TABS
25.0000 mg | ORAL_TABLET | Freq: Two times a day (BID) | ORAL | Status: AC
  Administered 2020-12-08 – 2020-12-09 (×3): 25 mg via ORAL
  Filled 2020-12-08 (×3): qty 1

## 2020-12-08 MED ORDER — LIDOCAINE HCL (PF) 1 % IJ SOLN
INTRAMUSCULAR | Status: DC | PRN
  Administered 2020-12-08: 2 mL

## 2020-12-08 MED ORDER — ALBUTEROL SULFATE (2.5 MG/3ML) 0.083% IN NEBU: 2.5000 mg | INHALATION_SOLUTION | Freq: Four times a day (QID) | RESPIRATORY_TRACT | Status: DC | PRN

## 2020-12-08 MED ORDER — TICAGRELOR 90 MG PO TABS
ORAL_TABLET | ORAL | Status: DC | PRN
  Administered 2020-12-08: 180 mg via ORAL

## 2020-12-08 MED ORDER — TICAGRELOR 90 MG PO TABS
ORAL_TABLET | ORAL | Status: AC
  Filled 2020-12-08: qty 2

## 2020-12-08 MED ORDER — ALPRAZOLAM 0.5 MG PO TABS
0.5000 mg | ORAL_TABLET | Freq: Two times a day (BID) | ORAL | Status: DC | PRN
  Administered 2020-12-08 – 2020-12-11 (×4): 0.5 mg via ORAL
  Filled 2020-12-08 (×4): qty 1

## 2020-12-08 MED ORDER — METHYLPREDNISOLONE SODIUM SUCC 125 MG IJ SOLR
INTRAMUSCULAR | Status: DC | PRN
Start: 2020-12-08 — End: 2020-12-08
  Administered 2020-12-08: 125 mg via INTRAVENOUS

## 2020-12-08 MED ORDER — SODIUM CHLORIDE 0.9 % IV SOLN: INTRAVENOUS | Status: AC

## 2020-12-08 MED ORDER — SODIUM CHLORIDE 0.9 % IV SOLN
INTRAVENOUS | Status: AC | PRN
  Administered 2020-12-08: 50 mL/h via INTRAVENOUS

## 2020-12-08 MED ORDER — NITROGLYCERIN 0.4 MG SL SUBL
0.4000 mg | SUBLINGUAL_TABLET | Freq: Once | SUBLINGUAL | Status: DC
Start: 2020-12-08 — End: 2020-12-12

## 2020-12-08 MED ORDER — ATORVASTATIN CALCIUM 80 MG PO TABS
80.0000 mg | ORAL_TABLET | Freq: Every day | ORAL | Status: DC
  Administered 2020-12-08 – 2020-12-09 (×2): 80 mg via ORAL
  Filled 2020-12-08 (×2): qty 1

## 2020-12-08 MED ORDER — FENTANYL CITRATE (PF) 100 MCG/2ML IJ SOLN
INTRAMUSCULAR | Status: AC
  Filled 2020-12-08: qty 2

## 2020-12-08 MED ORDER — DIPHENHYDRAMINE HCL 50 MG/ML IJ SOLN
INTRAMUSCULAR | Status: DC | PRN
  Administered 2020-12-08: 25 mg via INTRAVENOUS

## 2020-12-08 MED ORDER — MORPHINE SULFATE (PF) 2 MG/ML IV SOLN
2.0000 mg | INTRAVENOUS | Status: DC | PRN
  Administered 2020-12-08: 2 mg via INTRAVENOUS
  Filled 2020-12-08: qty 1

## 2020-12-08 MED ORDER — VERAPAMIL HCL 2.5 MG/ML IV SOLN
INTRAVENOUS | Status: DC | PRN
  Administered 2020-12-08 (×2): 5 mL via INTRA_ARTERIAL

## 2020-12-08 MED ORDER — ONDANSETRON HCL 4 MG/2ML IJ SOLN: 4.0000 mg | Freq: Four times a day (QID) | INTRAMUSCULAR | Status: DC | PRN

## 2020-12-08 MED ORDER — ALUM & MAG HYDROXIDE-SIMETH 200-200-20 MG/5ML PO SUSP
15.0000 mL | ORAL | Status: DC | PRN
  Administered 2020-12-08: 15 mL via ORAL
  Filled 2020-12-08: qty 30

## 2020-12-08 MED ORDER — SODIUM CHLORIDE 0.9% FLUSH: 3.0000 mL | INTRAVENOUS | Status: DC | PRN

## 2020-12-08 MED ORDER — NITROGLYCERIN IN D5W 200-5 MCG/ML-% IV SOLN
INTRAVENOUS | Status: AC
  Filled 2020-12-08: qty 250

## 2020-12-08 SURGICAL SUPPLY — 18 items
BALLN SAPPHIRE 2.0X12 (BALLOONS) ×2
BALLN SAPPHIRE ~~LOC~~ 2.5X15 (BALLOONS) ×1 IMPLANT
BALLOON SAPPHIRE 2.0X12 (BALLOONS) IMPLANT
CATH INFINITI 5FR ANG PIGTAIL (CATHETERS) ×1 IMPLANT
CATH OPTITORQUE TIG 4.0 5F (CATHETERS) ×1 IMPLANT
CATH VISTA GUIDE 6FR XBLAD3.0 (CATHETERS) ×1 IMPLANT
DEVICE RAD TR BAND REGULAR (VASCULAR PRODUCTS) ×1 IMPLANT
GLIDESHEATH SLEND A-KIT 6F 22G (SHEATH) ×1 IMPLANT
GUIDEWIRE INQWIRE 1.5J.035X260 (WIRE) IMPLANT
INQWIRE 1.5J .035X260CM (WIRE) ×2
KIT ENCORE 26 ADVANTAGE (KITS) ×1 IMPLANT
KIT HEART LEFT (KITS) ×2 IMPLANT
PACK CARDIAC CATHETERIZATION (CUSTOM PROCEDURE TRAY) ×2 IMPLANT
STENT ONYX FRONTIER 2.5X26 (Permanent Stent) ×1 IMPLANT
TRANSDUCER W/STOPCOCK (MISCELLANEOUS) ×2 IMPLANT
TUBING CIL FLEX 10 FLL-RA (TUBING) ×2 IMPLANT
WIRE ASAHI PROWATER 180CM (WIRE) ×1 IMPLANT
WIRE HITORQ VERSACORE ST 145CM (WIRE) ×1 IMPLANT

## 2020-12-08 NOTE — ED Notes (Signed)
EKG performed immediate in triage.  Pt taken immediately to room 6.  PA given EKG  notify ED Provider STEMI CALLED

## 2020-12-08 NOTE — Progress Notes (Signed)
PA notified r/t pt complaints of anxiety. Verbal order given to start pt home dose of xanax 0.'5mg'$  BID PRN. PA also stated to wean off nitro gtt as tolerated to keep pt SBP <150.

## 2020-12-08 NOTE — Progress Notes (Signed)
Chaplain responded to [employer].  Found pt's significant other of 25 years in family waiting area.  Chaplain gave Cath Lab staff his contact info.  Chaplain present when physician called.  Chaplain offered ministry of presence, hearing how lucky the pt is that she has survived this.  Yesterday was her daughter's wedding when this event actually started.  Chaplain offered ministry of care and concern.  [place]

## 2020-12-08 NOTE — Progress Notes (Signed)
Echo attempted. Patient just beginning to eat dinner. Will attempt again on 8/29.

## 2020-12-08 NOTE — Discharge Instructions (Addendum)

## 2020-12-08 NOTE — Progress Notes (Signed)
ANTICOAGULATION CONSULT NOTE - Initial Consult  Pharmacy Consult for heparin gtt  Indication: chest pain/ACS  Allergies  Allergen Reactions   Shellfish Allergy Anaphylaxis   Penicillins Hives and Itching    Did it involve swelling of the face/tongue/throat, SOB, or low BP? No Did it involve sudden or severe rash/hives, skin peeling, or any reaction on the inside of your mouth or nose? No Did you need to seek medical attention at a hospital or doctor's office? No When did it last happen?      65 years old If all above answers are "NO", may proceed with cephalosporin use.     Sulfa Antibiotics Hives and Itching    Patient Measurements: Height: '4\' 11"'$  (149.9 cm) Weight: 61.2 kg (135 lb) IBW/kg (Calculated) : 43.2 Heparin Dosing Weight: HEPARIN DW (KG): 56.2   Vital Signs: Temp: 98.2 F (36.8 C) (08/28 1040) Temp Source: Oral (08/28 1040) BP: 195/135 (08/28 1045) Pulse Rate: 97 (08/28 1045)  Labs: Recent Labs    12/08/20 1045  HGB 17.0*  HCT 46.6*  PLT 181    CrCl cannot be calculated (Patient's most recent lab result is older than the maximum 21 days allowed.).   Medical History: Past Medical History:  Diagnosis Date   Anxiety    Arthritis    all major joint   Asthma 07/22/2017   Depression    Hepatitis C 07/22/2017   History of kidney stones    Hypertension     Medications:  (Not in a hospital admission)  Scheduled:   nitroGLYCERIN  0.4 mg Sublingual Once   Infusions:   sodium chloride 20 mL/hr at 12/08/20 1047   heparin     PRN:  Anti-infectives (From admission, onward)    None       Assessment: Amber Peterson a 65 y.o. female requires anticoagulation with a heparin iv infusion for the indication of  chest pain/ACS. Heparin gtt will be started following pharmacy protocol per pharmacy consult. Patient is not on previous oral anticoagulant that will require aPTT/HL correlation before transitioning to only HL monitoring.   Goal of Therapy:   Heparin level 0.3-0.7 units/ml Monitor platelets by anticoagulation protocol: Yes   Plan:  Give 3650 units bolus x 1 Start heparin infusion at 750 units/hr Check anti-Xa level in 6 hours and daily while on heparin Continue to monitor H&H and platelets   Amber Peterson 12/08/2020,11:04 AM

## 2020-12-08 NOTE — ED Notes (Signed)
CODE STEMI called to [facility]. [employer] called to send truck for transport.

## 2020-12-08 NOTE — ED Triage Notes (Signed)
Pt look an antidepressent and xanex this AM.  Pt complaining of CP with nausea and vomiting with back pain

## 2020-12-08 NOTE — ED Notes (Signed)
Provider In room

## 2020-12-08 NOTE — ED Provider Notes (Signed)
[facility] Provider Note   CSN: [id number] Arrival date & time: 12/08/20  1029     History Chief Complaint  Patient presents with   Code STEMI    Amber Peterson is a 65 y.o. female.  Patient is a 65 yo female with pmh of HTN present for chest pain. Pt admits to sternal chest pain with radiation the the neck that started at 3:30AM this morning, described as constant, and severe. No diaphoresis or nausea. STEMI present in anterior leads on 12 lead EKG.   The history is provided by the patient. No language interpreter was used.  Chest Pain Pain location:  Substernal area Pain radiates to:  Neck Pain severity:  Severe Onset quality:  Sudden Associated symptoms: no abdominal pain, no back pain, no cough, no fever, no palpitations, no shortness of breath and no vomiting       Past Medical History:  Diagnosis Date   Anxiety    Arthritis    all major joint   Asthma 07/22/2017   Depression    Hepatitis C 07/22/2017   History of kidney stones    Hypertension     Patient Active Problem List   Diagnosis Date Noted   Glenoid fracture of shoulder, right, closed, initial encounter 03/22/2019   Colon cancer screening 12/26/2018   Hepatitis C 07/22/2017   Asthma 07/22/2017    Past Surgical History:  Procedure Laterality Date   ABDOMINAL HYSTERECTOMY  1995   complete hysterectomy     FRACTURE SURGERY Right 01/27/2019   REVERSE SHOULDER ARTHROPLASTY Right 03/22/2019   Procedure: REVERSE SHOULDER ARTHROPLASTY;  Surgeon: Hiram Gash, MD;  Location: [facility];  Service: Orthopedics;  Laterality: Right;   tonsillectomy     TONSILLECTOMY  2000     OB History   No obstetric history on file.     No family history on file.  Social History   Tobacco Use   Smoking status: Never   Smokeless tobacco: Never  Vaping Use   Vaping Use: Never used  Substance Use Topics   Alcohol use: Not Currently    Comment: rarely   Drug use: Never    Home  Medications Prior to Admission medications   Medication Sig Start Date End Date Taking? Authorizing Provider  albuterol (VENTOLIN HFA) 108 (90 Base) MCG/ACT inhaler Inhale 2 puffs into the lungs every 6 (six) hours as needed for wheezing or shortness of breath.    [provider]  ALPRAZolam Duanne Moron) 0.5 MG tablet Take 0.5 mg by mouth 2 (two) times daily as needed. 07/13/19   [provider]  budesonide-formoterol (SYMBICORT) 160-4.5 MCG/ACT inhaler Inhale 2 puffs into the lungs 2 (two) times daily.    [provider]  celecoxib (CELEBREX) 100 MG capsule Take 100 mg by mouth 2 (two) times daily.    [provider]  hydroxypropyl methylcellulose / hypromellose (ISOPTO TEARS / GONIOVISC) 2.5 % ophthalmic solution Place 1 drop into both eyes 2 (two) times daily as needed for dry eyes.    [provider]  nebivolol (BYSTOLIC) 10 MG tablet Take 10 mg by mouth daily.    [provider]  traZODone (DESYREL) 50 MG tablet Take 50 mg by mouth at bedtime as needed for sleep.    [provider]  venlafaxine XR (EFFEXOR-XR) 150 MG 24 hr capsule Take 150 mg by mouth daily with breakfast.    [provider]    Allergies    Shellfish allergy, Penicillins, and  Sulfa antibiotics  Review of Systems   Review of Systems  Constitutional:  Negative for chills and fever.  HENT:  Negative for ear pain and sore throat.   Eyes:  Negative for pain and visual disturbance.  Respiratory:  Negative for cough and shortness of breath.   Cardiovascular:  Positive for chest pain. Negative for palpitations.  Gastrointestinal:  Negative for abdominal pain and vomiting.  Genitourinary:  Negative for dysuria and hematuria.  Musculoskeletal:  Negative for arthralgias and back pain.  Skin:  Negative for color change and rash.  Neurological:  Negative for seizures and syncope.  All other systems reviewed and are negative.  Physical Exam Updated Vital Signs BP  (!) 195/135   Pulse 97   Temp 98.2 F (36.8 C) (Oral)   Resp 14   Ht '4\' 11"'$  (1.499 m)   Wt 61.2 kg   SpO2 96%   BMI 27.27 kg/m   Physical Exam Vitals and nursing note reviewed.  Constitutional:      General: She is not in acute distress.    Appearance: She is well-developed.  HENT:     Head: Normocephalic and atraumatic.  Eyes:     Conjunctiva/sclera: Conjunctivae normal.  Cardiovascular:     Rate and Rhythm: Normal rate and regular rhythm.     Heart sounds: No murmur heard. Pulmonary:     Effort: Pulmonary effort is normal. No respiratory distress.     Breath sounds: Normal breath sounds.  Abdominal:     Palpations: Abdomen is soft.     Tenderness: There is no abdominal tenderness.  Musculoskeletal:     Cervical back: Neck supple.  Skin:    General: Skin is warm and dry.  Neurological:     Mental Status: She is alert.    ED Results / Procedures / Treatments   Labs (all labs ordered are listed, but only abnormal results are displayed) Labs Reviewed  CBC WITH DIFFERENTIAL/PLATELET - Abnormal; Notable for the following components:      Result Value   WBC 20.0 (*)    Hemoglobin 17.0 (*)    HCT 46.6 (*)    MCH 34.9 (*)    MCHC 36.5 (*)    Neutro Abs 16.8 (*)    Abs Immature Granulocytes 0.16 (*)    All other components within normal limits  RESP PANEL BY RT-PCR (FLU A&B, COVID) ARPGX2  HEMOGLOBIN A1C  PROTIME-INR  APTT  COMPREHENSIVE METABOLIC PANEL  LIPID PANEL  TROPONIN I (HIGH SENSITIVITY)    EKG None  Radiology No results found.  Procedures .Critical Care  Date/Time: 12/08/2020 10:59 AM Performed by: Lianne Cure, DO Authorized by: Lianne Cure, DO   Critical care provider statement:    Critical care time (minutes):  36   Critical care was necessary to treat or prevent imminent or life-threatening deterioration of the following conditions:  Cardiac failure (STEMI)   Critical care was time spent personally by me on the following  activities:  Blood draw for specimens, development of treatment plan with patient or surrogate, discussions with consultants, evaluation of patient's response to treatment, pulse oximetry and re-evaluation of patient's condition   I assumed direction of critical care for this patient from another provider in my specialty: yes     Care discussed with comment:  Cardiologist discussion Comments:     Anterior stemi, heparin started, cath lab activated   Medications Ordered in ED Medications  0.9 %  sodium chloride infusion ( Intravenous New Bag/Given 12/08/20  1047)  nitroGLYCERIN (NITROSTAT) SL tablet 0.4 mg (has no administration in time range)  aspirin chewable tablet 324 mg (324 mg Oral Given 12/08/20 1038)  heparin injection 3,650 Units (3,650 Units Intravenous Given 12/08/20 1051)    ED Course  I have reviewed the triage vital signs and the nursing notes.  Pertinent labs & imaging results that were available during my care of the patient were reviewed by me and considered in my medical decision making (see chart for details).    MDM Rules/Calculators/A&P                          11:00 AM Patient is a 65 yo female with pmh of HTN presenting for chest pain. STEMI present in anterior leads on 12 lead EKG. Otherwise stable vitals. Code stemi acitvated. ASA given. Heparin given. I spoke with on call cardiologist Dr. Benjamin Stain who recommends cath lab activation and transfer to [facility]. Family up to date.   EMS at bedside. Vitals remains table.     Final Clinical Impression(s) / ED Diagnoses Final diagnoses:  ST elevation myocardial infarction (STEMI), unspecified artery [facility])    Rx / DC Orders ED Discharge Orders     None        Lianne Cure, DO [id number]

## 2020-12-08 NOTE — ED Notes (Signed)
EDP at Bedside 

## 2020-12-08 NOTE — H&P (Addendum)
Cardiology Admission History and Physical:   Patient ID: JAHMYA MOXLEY MRN: [id number]; DOB: [birth year]   Admission date: 12/08/2020  PCP:  Amber Squibb, MD   [place] [place] Providers Cardiologist:  New  Chief Complaint:  CP  Patient Profile:   Amber Peterson is a 65 y.o. female with history of hypertension and hepatitis C who is being seen 12/08/2020 for the evaluation of chest pain consistent with STEMI.  History of Present Illness:   Amber Peterson denies prior cardiac history.  She was in usual state of health up until this morning when woke this morning around 3:30 AM with severe substernal chest pressure.  No diaphoresis or nausea.  Radiating to her back.  She went to [facility] ER where EKG showed anterior STEMI.  She was given aspirin, nitroglycerin and heparin bolus.  Upon arrival to [facility] she was hemodynamically stable.  Taken to Cath Lab for emergent cardiac catheterization.  High-sensitivity troponin 4575 Creatinine and electrolytes normal LDL 135 WBC 20 Respiratory panel negative for COVID and influenza Chest x-ray with cardiomegaly  Past Medical History:  Diagnosis Date   Anxiety    Arthritis    all major joint   Asthma 07/22/2017   Depression    Hepatitis C 07/22/2017   History of kidney stones    Hypertension     Past Surgical History:  Procedure Laterality Date   ABDOMINAL HYSTERECTOMY  1995   complete hysterectomy     FRACTURE SURGERY Right 01/27/2019   REVERSE SHOULDER ARTHROPLASTY Right 03/22/2019   Procedure: REVERSE SHOULDER ARTHROPLASTY;  Surgeon: Amber Gash, MD;  Location: [facility];  Service: Orthopedics;  Laterality: Right;   tonsillectomy     TONSILLECTOMY  2000     Medications Prior to Admission: Prior to Admission medications   Medication Sig Start Date End Date Taking? Authorizing Provider  albuterol (VENTOLIN HFA) 108 (90 Base) MCG/ACT inhaler Inhale 2 puffs into the lungs every 6 (six) hours as needed for wheezing or shortness of  breath.    [provider]  ALPRAZolam Duanne Moron) 0.5 MG tablet Take 0.5 mg by mouth 2 (two) times daily as needed. 07/13/19   [provider]  budesonide-formoterol (SYMBICORT) 160-4.5 MCG/ACT inhaler Inhale 2 puffs into the lungs 2 (two) times daily.    [provider]  celecoxib (CELEBREX) 100 MG capsule Take 100 mg by mouth 2 (two) times daily.    [provider]  hydroxypropyl methylcellulose / hypromellose (ISOPTO TEARS / GONIOVISC) 2.5 % ophthalmic solution Place 1 drop into both eyes 2 (two) times daily as needed for dry eyes.    [provider]  nebivolol (BYSTOLIC) 10 MG tablet Take 10 mg by mouth daily.    [provider]  traZODone (DESYREL) 50 MG tablet Take 50 mg by mouth at bedtime as needed for sleep.    [provider]  venlafaxine XR (EFFEXOR-XR) 150 MG 24 hr capsule Take 150 mg by mouth daily with breakfast.    [provider]     Allergies:    Allergies  Allergen Reactions   Shellfish Allergy Anaphylaxis   Penicillins Hives and Itching    Did it involve swelling of the face/tongue/throat, SOB, or low BP? No Did it involve sudden or severe rash/hives, skin peeling, or any reaction on the inside of your mouth or nose? No Did you need to seek medical attention at a hospital or doctor's office? No When did it last happen?  65 years old If all above answers are "NO", may proceed with cephalosporin use.     Sulfa Antibiotics Hives and Itching    Social History:   Social History   Socioeconomic History   Marital status: Divorced    Spouse name: Not on file   Number of children: Not on file   Years of education: Not on file   Highest education level: Not on file  Occupational History   Not on file  Tobacco Use   Smoking status: Never   Smokeless tobacco: Never  Vaping Use   Vaping Use: Never used  Substance and Sexual Activity   Alcohol use: Not Currently    Comment: rarely   Drug use: Never    Sexual activity: Not on file  Other Topics Concern   Not on file  Social History Narrative   Not on file   Social Determinants of Health   Financial Resource Strain: Not on file  Food Insecurity: Not on file  Transportation Needs: Not on file  Physical Activity: Not on file  Stress: Not on file  Social Connections: Not on file  Intimate Partner Violence: Not on file    Family History:   The patient's family history is not on file.   Unable to obtain history due to acute STEMI  ROS:  Please see the history of present illness.  All other ROS reviewed and negative.     Physical Exam/Data:   Vitals:   12/08/20 1040 12/08/20 1043 12/08/20 1045  BP: (!) 195/135  (!) 195/135  Pulse: 92  97  Resp: 18  14  Temp: 98.2 F (36.8 C)    TempSrc: Oral    SpO2: 98%  96%  Weight:  61.2 kg   Height:  '4\' 11"'$  (1.499 m)    No intake or output data in the 24 hours ending 12/08/20 1153 Last 3 Weights 12/08/2020 03/22/2019 03/15/2019  Weight (lbs) 135 lb 131 lb 14.4 oz 131 lb 14.4 oz  Weight (kg) 61.236 kg 59.829 kg 59.829 kg     Body mass index is 27.27 kg/m.  General:  Well nourished, well developed, in no acute distress HEENT: normal Lymph: no adenopathy Neck: no JVD Endocrine:  No thryomegaly Vascular: No carotid bruits; FA pulses 2+ bilaterally without bruits  Cardiac:  normal S1, S2; RRR; no murmur  Lungs:  clear to auscultation bilaterally, no wheezing, rhonchi or rales  Abd: soft, nontender, no hepatomegaly  Ext: no edema Musculoskeletal:  No deformities, BUE and BLE strength normal and equal Skin: warm and dry  Neuro:  CNs 2-12 intact, no focal abnormalities noted Psych:  Normal affect    EKG:  The ECG that was done today was personally reviewed and demonstrates sinus rhythm with anterior ST elevation  Relevant CV Studies: Cardiac catheterization pending   Laboratory Data:  High Sensitivity Troponin:   Recent Labs  Lab 12/08/20 1045  TROPONINIHS 4,575*       Chemistry Recent Labs  Lab 12/08/20 1045  NA 135  K 3.6  CL 101  CO2 25  GLUCOSE 217*  BUN 8  CREATININE 0.74  CALCIUM 9.1  GFRNONAA >60  ANIONGAP 9    Recent Labs  Lab 12/08/20 1045  PROT 8.5*  ALBUMIN 4.5  AST 82*  ALT 37  ALKPHOS 78  BILITOT 2.3*   Hematology Recent Labs  Lab 12/08/20 1045  WBC 20.0*  RBC 4.87  HGB 17.0*  HCT 46.6*  MCV 95.7  MCH 34.9*  MCHC  36.5*  RDW 12.9  PLT 181    Radiology/Studies:  DG Chest Port 1 View  Result Date: 12/08/2020 CLINICAL DATA:  Chest pain EXAM: PORTABLE CHEST 1 VIEW COMPARISON:  Chest x-ray dated 06/08/2020 FINDINGS: Borderline cardiomegaly, stable. Pacer pad overlies the LEFT heart border. Upper mediastinum is within normal limits in configuration. Lungs are clear. No pleural effusion or pneumothorax is seen. No acute-appearing osseous abnormality. IMPRESSION: No active disease. No evidence of pneumonia or pulmonary edema. Borderline cardiomegaly. Electronically Signed   By: Franki Cabot M.D.   On: 12/08/2020 11:31     Assessment and Plan:   Anterior STEMI Pending cath   Risk Assessment/Risk Scores:   TIMI Risk Score for ST  Elevation MI:   The patient's TIMI risk score is 4, which indicates a 7.3% risk of all cause mortality at 30 days. {  Severity of Illness: The appropriate patient status for this patient is INPATIENT. Inpatient status is judged to be reasonable and necessary in order to provide the required intensity of service to ensure the patient's safety. The patient's presenting symptoms, physical exam findings, and initial radiographic and laboratory data in the context of their chronic comorbidities is felt to place them at high risk for further clinical deterioration. Furthermore, it is not anticipated that the patient will be medically stable for discharge from the hospital within 2 midnights of admission. The following factors support the patient status of inpatient.   " The patient's presenting  symptoms include CP. " The worrisome physical exam findings include N/A " The initial radiographic and laboratory data are worrisome because of ST elevation . " The chronic co-morbidities include HTN.   * I certify that at the point of admission it is my clinical judgment that the patient will require inpatient hospital care spanning beyond 2 midnights from the point of admission due to high intensity of service, high risk for further deterioration and high frequency of surveillance required.*   For questions or updates, please contact [place] Please consult [email or web address] for contact info under     Signed, Leanor Kail, PA  12/08/2020 11:53 AM    Agree with note by Robbie Lis PA-C  65 y/o SWF w/o prior cardiac Hx. H/O treated HTN. Developed SSCP at 3:30 am, came to [facility] this morning and was found to have Ant ST elev with q waves across her precordium (late presentation). Because of on going  Cp/back pain she was transferred to [facility] for urgent cath and intervention. She was hemodynamically stable.  Lorretta Harp, M.D., [place], [facility], [place] [address] [place], [place]  [address]  [phone] 12/08/2020 2:15 PM

## 2020-12-09 ENCOUNTER — Inpatient Hospital Stay (HOSPITAL_COMMUNITY): Payer: Medicare Other

## 2020-12-09 ENCOUNTER — Other Ambulatory Visit (HOSPITAL_COMMUNITY): Payer: Self-pay

## 2020-12-09 ENCOUNTER — Encounter (HOSPITAL_COMMUNITY): Payer: Self-pay | Admitting: Cardiovascular Disease

## 2020-12-09 DIAGNOSIS — I25110 Atherosclerotic heart disease of native coronary artery with unstable angina pectoris: Secondary | ICD-10-CM

## 2020-12-09 DIAGNOSIS — I10 Essential (primary) hypertension: Secondary | ICD-10-CM | POA: Diagnosis present

## 2020-12-09 DIAGNOSIS — I2102 ST elevation (STEMI) myocardial infarction involving left anterior descending coronary artery: Secondary | ICD-10-CM

## 2020-12-09 DIAGNOSIS — Z955 Presence of coronary angioplasty implant and graft: Secondary | ICD-10-CM

## 2020-12-09 DIAGNOSIS — F411 Generalized anxiety disorder: Secondary | ICD-10-CM

## 2020-12-09 DIAGNOSIS — E785 Hyperlipidemia, unspecified: Secondary | ICD-10-CM | POA: Diagnosis present

## 2020-12-09 HISTORY — PX: TRANSTHORACIC ECHOCARDIOGRAM: SHX275

## 2020-12-09 LAB — CBC
HCT: 42.3 % (ref 36.0–46.0)
Hemoglobin: 15.7 g/dL — ABNORMAL HIGH (ref 12.0–15.0)
MCH: 34.7 pg — ABNORMAL HIGH (ref 26.0–34.0)
MCHC: 37.1 g/dL — ABNORMAL HIGH (ref 30.0–36.0)
MCV: 93.6 fL (ref 80.0–100.0)
Platelets: 160 10*3/uL (ref 150–400)
RBC: 4.52 MIL/uL (ref 3.87–5.11)
RDW: 12.8 % (ref 11.5–15.5)
WBC: 23.6 10*3/uL — ABNORMAL HIGH (ref 4.0–10.5)
nRBC: 0 % (ref 0.0–0.2)

## 2020-12-09 LAB — ECHOCARDIOGRAM COMPLETE
AR max vel: 1.32 cm2
AV Area VTI: 1.5 cm2
AV Area mean vel: 1.42 cm2
AV Mean grad: 5 mmHg
AV Peak grad: 8.2 mmHg
Ao pk vel: 1.43 m/s
Area-P 1/2: 3.67 cm2
Height: 59 in
S' Lateral: 2.5 cm
Single Plane A4C EF: 54.8 %
Weight: 2160 oz

## 2020-12-09 LAB — BASIC METABOLIC PANEL
Anion gap: 9 (ref 5–15)
BUN: 8 mg/dL (ref 8–23)
CO2: 23 mmol/L (ref 22–32)
Calcium: 9.4 mg/dL (ref 8.9–10.3)
Chloride: 103 mmol/L (ref 98–111)
Creatinine, Ser: 0.68 mg/dL (ref 0.44–1.00)
GFR, Estimated: >60 mL/min (ref >60)
Glucose, Bld: 138 mg/dL — ABNORMAL HIGH (ref 70–99)
Potassium: 4.4 mmol/L (ref 3.5–5.1)
Sodium: 135 mmol/L (ref 135–145)

## 2020-12-09 LAB — TSH: TSH: 0.643 u[IU]/mL (ref 0.350–4.500)

## 2020-12-09 MED ORDER — NEBIVOLOL HCL 10 MG PO TABS: 10.0000 mg | ORAL_TABLET | Freq: Every day | ORAL | Status: DC

## 2020-12-09 MED ORDER — FLUTICASONE FUROATE-VILANTEROL 200-25 MCG/INH IN AEPB
1.0000 | INHALATION_SPRAY | Freq: Every day | RESPIRATORY_TRACT | Status: DC
  Administered 2020-12-09 – 2020-12-12 (×4): 1 via RESPIRATORY_TRACT
  Filled 2020-12-09 (×2): qty 28

## 2020-12-09 MED ORDER — LEVOTHYROXINE SODIUM 50 MCG PO TABS
50.0000 ug | ORAL_TABLET | Freq: Every day | ORAL | Status: DC
  Administered 2020-12-09 – 2020-12-12 (×4): 50 ug via ORAL
  Filled 2020-12-09 (×4): qty 1

## 2020-12-09 MED ORDER — TRAZODONE HCL 150 MG PO TABS
150.0000 mg | ORAL_TABLET | Freq: Every day | ORAL | Status: DC
  Administered 2020-12-09 – 2020-12-11 (×3): 150 mg via ORAL
  Filled 2020-12-09: qty 3
  Filled 2020-12-09: qty 1
  Filled 2020-12-09: qty 3

## 2020-12-09 MED ORDER — METOPROLOL SUCCINATE ER 50 MG PO TB24: 50.0000 mg | ORAL_TABLET | Freq: Every day | ORAL | Status: DC

## 2020-12-09 MED ORDER — VENLAFAXINE HCL ER 75 MG PO CP24
150.0000 mg | ORAL_CAPSULE | Freq: Every day | ORAL | Status: DC
  Administered 2020-12-10 – 2020-12-12 (×3): 150 mg via ORAL
  Filled 2020-12-09: qty 2
  Filled 2020-12-09 (×2): qty 1

## 2020-12-09 NOTE — TOC Benefit Eligibility Note (Signed)
Patient Research scientist (life sciences) completed.     The patient is currently admitted and upon discharge could be taking Entresto 24-26 mg.   The current 30 day co-pay is, $47.00.   The patient is currently admitted and upon discharge could be taking Jardiance 10 mg.   The current 30 day co-pay is, $47.00.   The patient is currently admitted and upon discharge could be taking Farxiga mg.   The current 30 day co-pay is, $47.00.    The patient is insured through [place], [place] Patient Advocate Specialist [place] Team Direct Number: [phone]  Fax: [phone]

## 2020-12-09 NOTE — TOC Benefit Eligibility Note (Addendum)
Patient Teacher, English as a foreign language completed.    The patient is currently admitted and upon discharge could be taking Brilinta 90 mg.  The current 30 day co-pay is, $47.00.   The patient is insured through [place], [place] Patient Advocate Specialist [place] Team Direct Number: [phone]  Fax: [phone]

## 2020-12-09 NOTE — Progress Notes (Signed)
Progress Note  Patient Name: Amber Peterson Date of Encounter: 12/09/2020  [facility] Cardiologist: None Dr. Harrell Gave  Patient Profile     66 y.o. female with PMH notable for hypertension and GAD as well as prior hep C who presented on 12/08/2020 with signs symptoms of anterior STEMI.  She underwent LAD PCI, and has existing disease in the LCx-plan for staged PCI.  Assessment & Plan    Principal Problem:   Acute ST elevation myocardial infarction (STEMI) due to occlusion of distal portion of left anterior descending (LAD) coronary artery (HCC) Active Problems:   Coronary artery disease involving native coronary artery of native heart with unstable angina pectoris (HCC)   Presence of drug coated stent in LAD coronary artery   GAD (generalized anxiety disorder) - with depression   Essential hypertension   Hyperlipidemia with target LDL less than 70    Acute ST elevation myocardial infarction (STEMI) due to occlusion of distal portion of left anterior descending (LAD) coronary artery (HCC) - Coronary artery disease involving native coronary artery of native heart with unstable angina pectoris (HCC) -->  Presence of drug coated stent in LAD coronary artery Currently on aspirin and Brilinta-we will review cost assessment-but appropriately on Brilinta for diabetic with ACS Not on heparin, however low threshold to restart if she has recurrent symptoms given the existing circumflex disease. High-dose high intensity statin started (atorvastatin 80 mg) Will reinitiate home dose of Bystolic. Anticipate staged PCI of LCx tomorrow will be a complicated PCI as a bifurcation.  (Discussed with IC colleagues)     Essential hypertension -> restart home dose Bystolic, plus or minus ARB pending reassessment.    Hyperlipidemia with target LDL less than 70 -> started on atorvastatin 80 mg -> reassess lipids in roughly 2 months.   GAD (generalized anxiety disorder) - with depression: Currently on  as needed Xanax, will restart home doses of trazodone and Effexor -Hypothyroidism: Restart home dose Synthroid, can recheck the TSH in the morning.  Restart PTA Asthma meds  Dispo: Echo pending, however I suspect her EF will be diminished.  We will need to titrate medications.  She also will need staged PCI tomorrow.  Anticipate if echo smoothly tomorrow she may be ready for discharge in 2 days if meds are stabilized.  Subjective   No further chest pain or dyspnea.  Felt much better after PCI.  No palpitations or irregular heartbeats.  No nausea.  No bleeding.  Inpatient Medications    Scheduled Meds:  aspirin  81 mg Oral Daily   atorvastatin  80 mg Oral Q2000   Chlorhexidine Gluconate Cloth  6 each Topical Daily   fluticasone furoate-vilanterol  1 puff Inhalation Daily   levothyroxine  50 mcg Oral Q0600   metoprolol tartrate  25 mg Oral BID   [START ON 12/10/2020] nebivolol  10 mg Oral Daily   nitroGLYCERIN  0.4 mg Sublingual Once   sodium chloride flush  3 mL Intravenous Q12H   ticagrelor  90 mg Oral BID   traZODone  150 mg Oral QHS   [START ON 12/10/2020] venlafaxine XR  150 mg Oral Q breakfast   Continuous Infusions:  sodium chloride 20 mL/hr at 12/08/20 1047   sodium chloride     PRN Meds: sodium chloride, acetaminophen, albuterol, ALPRAZolam, alum & mag hydroxide-simeth, morphine injection, ondansetron (ZOFRAN) IV, sodium chloride flush   Vital Signs    Vitals:   12/09/20 0700 12/09/20 0755 12/09/20 0800 12/09/20 0912  BP: (!) 139/94  137/89 121/74  Pulse: 81  91 98  Resp: 18  18   Temp:  98.5 F (36.9 C)    TempSrc:  Oral    SpO2: 94%  92%   Weight:      Height:        Intake/Output Summary (Last 24 hours) at 12/09/2020 1010 Last data filed at 12/09/2020 0916 Gross per 24 hour  Intake 618.32 ml  Output --  Net 618.32 ml   Last 3 Weights 12/08/2020 03/22/2019 03/15/2019  Weight (lbs) 135 lb 131 lb 14.4 oz 131 lb 14.4 oz  Weight (kg) 61.236 kg 59.829 kg 59.829  kg      Telemetry    NSR 80-90s, occasional PVCs - Personally Reviewed  ECG    Sinus rhythm-87 bpm.  Evolutionary changes of anterior isolation with persistent ST elevations and now T wave inversions with poor R wave progression in anterior leads (V2 through V5).  No reciprocal changes noted..- Personally Reviewed  Physical Exam   GEN: No acute distress.  Sitting comfortably in the bedside chair. Neck: No JVD Cardiac: Normal S1 and S2.  RRR, no murmurs, rubs, or gallops.  Respiratory: Clear to auscultation bilaterally.  Nonlabored, good air movement. GI: Soft, nontender, non-distended  MS: No edema; No deformity.  Radial cath site clean dry and intact.  Minimal bruising.  More bruising at the antecubital IV site. Neuro:  Nonfocal  Psych: Normal affect   Labs    High Sensitivity Troponin:   Recent Labs  Lab 12/08/20 1045 12/08/20 1427  TROPONINIHS 4,575* >24,000*      Chemistry Recent Labs  Lab 12/08/20 1045 12/09/20 0135  NA 135 135  K 3.6 4.4  CL 101 103  CO2 25 23  GLUCOSE 217* 138*  BUN 8 8  CREATININE 0.74 0.68  CALCIUM 9.1 9.4  PROT 8.5*  --   ALBUMIN 4.5  --   AST 82*  --   ALT 37  --   ALKPHOS 78  --   BILITOT 2.3*  --   GFRNONAA >60 >60  ANIONGAP 9 9     Hematology Recent Labs  Lab 12/08/20 1045 12/09/20 0135  WBC 20.0* 23.6*  RBC 4.87 4.52  HGB 17.0* 15.7*  HCT 46.6* 42.3  MCV 95.7 93.6  MCH 34.9* 34.7*  MCHC 36.5* 37.1*  RDW 12.9 12.8  PLT 181 160    BNPNo results for input(s): BNP, PROBNP in the last 168 hours.   DDimer No results for input(s): DDIMER in the last 168 hours.   Radiology    CARDIAC CATHETERIZATION  Result Date: 12/08/2020 Images from the original result were not included.   Prox RCA lesion is 30% stenosed.   Prox LAD to Mid LAD lesion is 100% stenosed.   Prox Cx to Mid Cx lesion is 80% stenosed.   Mid Cx lesion is 99% stenosed.   A drug-eluting stent was successfully placed using a STENT ONYX FRONTIER 2.5X26.    Post intervention, there is a 0% residual stenosis. Amber Peterson is a 65 y.o. female  [id number] LOCATION:  FACILITY: [facility] PHYSICIAN: Quay Burow, M.D. Feb 09, 1956 DATE OF PROCEDURE:  12/08/2020 DATE OF DISCHARGE: CARDIAC CATHETERIZATION / PCI DES mid LAD History obtained from chart review.  65 year old single Caucasian female mother of 1 child who lives in [place] without prior cardiac history.  She does have a history of treated hypertension.  She was awakened at approximately 330 this morning with chest pain and back pain rating to her  shoulder.  She went to [facility] emergency room where an EKG showed anterior ST segment elevation with Q waves in her anterior precordial leads.  She was transferred at approximately 11:00 (approximately 8 hours after symptom onset) for urgent catheterization and potential intervention. PROCEDURE DESCRIPTION: The patient was brought to the second floor [place] Cardiac cath lab in the postabsorptive state.  She was premedicated with IV Benadryl and Solu-Medrol for contrast allergy prophylaxis.  She also received IV fentanyl.  Her right wrist was prepped and shaved in usual sterile fashion. Xylocaine 1% was used for local anesthesia. A 6 French sheath was inserted into the right radial artery using standard Seldinger technique. The patient received 3500 units  of heparin intravenously.  A 5 [place] TIG catheter and pigtail catheter used for selective coronary angiography and obtain left heart pressures.  Isovue dye was used for the entirety of the case (130 cc total administered to patient).  Retrograde aortic, ventricular and pullback pressures were recorded.  Radial cocktail was administered via the SideArm sheath. Patient was seen for total [place] units of heparin with an ACT of 387.  Isovue dye is used for the entirety of the intervention.  Retroaortic pressures monitored during the case.  She was hypertensive throughout the case.  180 mg of p.o. Brilinta was  administered to the patient p.o. prior to intervention. Using a 6 [place] XB LAD 3.0 cm guide cath along with 0.14 [place] guidewire and a 2 mm x 12 m balloon the wire easily crossed the lesion providing antegrade flow with a door to balloon time of 37 minutes.  I then carefully positioned a 2.5 mm x 26 mm long Medtronic frontier drug-eluting stent and deployed at 14 atm.  I postdilated the entire stented segment with a 2.5 mm x 15 mm long noncompliant balloon at 18 atm (2.6 mm) resulting reduction of a total occlusion to 0% residual TIMI-3 flow.  I did give 200 mcg of intracoronary glycerin.  There was "pruning of the distal vessel. There was an 80% AV groove circumflex stenosis just before the takeoff of a large marginal branch with a 99% continuation of the AV groove circumflex.   [name] had an occluded LAD with a door to balloon time of 37 minutes and most likely a completed anterior STEMI although she was having residual back pain.  She did have Q waves in the anterior leads and restoration of blood flow occurred approximately 8 hours after symptom onset.  I began her on low-dose IV nitroglycerin for blood pressure control.  She will need guideline directed optimal medical therapy with beta-blockade plus or minus ACE/ARB depending on her LV function which we determined by 2D echocardiogram.  She has residual circumflex disease which will be intervened on in a staged fashion.  The sheath was removed and a TR band was placed on the right wrist to achieve patent hemostasis.  The patient left the lab in stable condition. Quay Burow. MD, [facility] 12/08/2020 12:50 PM    DG Chest Port 1 View  Result Date: 12/08/2020 CLINICAL DATA:  Chest pain EXAM: PORTABLE CHEST 1 VIEW COMPARISON:  Chest x-ray dated 06/08/2020 FINDINGS: Borderline cardiomegaly, stable. Pacer pad overlies the LEFT heart border. Upper mediastinum is within normal limits in configuration. Lungs are clear. No pleural effusion or pneumothorax is  seen. No acute-appearing osseous abnormality. IMPRESSION: No active disease. No evidence of pneumonia or pulmonary edema. Borderline cardiomegaly. Electronically Signed   By: Roxy Horseman.D.  On: 12/08/2020 11:31    Cardiac Studies   Cardiac Cath-PCI 12/02/2020: Proximal-mid LAD 100%-DES PCI Onyx Frontier 2.5 x 26; proximal RCA 30%.  Mid LCx bifurcation lesion 80% LCx, 99% AV groove (plan medical management for now with staged PCI) 2D echo pending    For questions or updates, please contact [facility] Please consult [email or web address] for contact info under        Signed, Glenetta Hew, MD  12/09/2020, 10:10 AM

## 2020-12-09 NOTE — Progress Notes (Signed)
EKG CRITICAL VALUE     12 lead EKG performed.  Critical value noted.  Izetta Dakin, RN notified.   Willaim Rayas, CCT 12/09/2020 7:59 AM

## 2020-12-09 NOTE — Progress Notes (Signed)
[id number] MI education completed with pt, sister and significant other who voiced understanding. Discussed importance of brilinta with stent, NTG use, walking for ex, heart healthy and 2000 mg sodium restriction food choices, MI restrictions, risk factors and CRP 2. Referred to [facility] CRP 2. Graylon Good RN BSN 12/09/2020 3:22 PM

## 2020-12-10 ENCOUNTER — Inpatient Hospital Stay (HOSPITAL_COMMUNITY)
Admission: EM | Disposition: A | Discharge status: Home / Self Care | Payer: Self-pay | Source: Home / Self Care | Attending: Cardiology

## 2020-12-10 ENCOUNTER — Encounter (HOSPITAL_COMMUNITY): Payer: Self-pay | Admitting: Cardiology

## 2020-12-10 DIAGNOSIS — I2102 ST elevation (STEMI) myocardial infarction involving left anterior descending coronary artery: Secondary | ICD-10-CM | POA: Diagnosis not present

## 2020-12-10 DIAGNOSIS — I255 Ischemic cardiomyopathy: Secondary | ICD-10-CM

## 2020-12-10 DIAGNOSIS — I25110 Atherosclerotic heart disease of native coronary artery with unstable angina pectoris: Secondary | ICD-10-CM | POA: Diagnosis not present

## 2020-12-10 DIAGNOSIS — F411 Generalized anxiety disorder: Secondary | ICD-10-CM | POA: Diagnosis not present

## 2020-12-10 DIAGNOSIS — I10 Essential (primary) hypertension: Secondary | ICD-10-CM | POA: Diagnosis not present

## 2020-12-10 HISTORY — PX: CORONARY STENT INTERVENTION: CATH118234

## 2020-12-10 HISTORY — DX: Ischemic cardiomyopathy: I25.5

## 2020-12-10 HISTORY — PX: LEFT HEART CATH: CATH118248

## 2020-12-10 HISTORY — PX: CORONARY BALLOON ANGIOPLASTY: CATH118233

## 2020-12-10 LAB — CBC
HCT: 38.1 % (ref 36.0–46.0)
HCT: 36.3 % (ref 36.0–46.0)
Hemoglobin: 14 g/dL (ref 12.0–15.0)
Hemoglobin: 13.3 g/dL (ref 12.0–15.0)
MCH: 34.7 pg — ABNORMAL HIGH (ref 26.0–34.0)
MCH: 34.7 pg — ABNORMAL HIGH (ref 26.0–34.0)
MCHC: 36.7 g/dL — ABNORMAL HIGH (ref 30.0–36.0)
MCHC: 36.6 g/dL — ABNORMAL HIGH (ref 30.0–36.0)
MCV: 94.3 fL (ref 80.0–100.0)
MCV: 94.8 fL (ref 80.0–100.0)
Platelets: 106 10*3/uL — ABNORMAL LOW (ref 150–400)
Platelets: 131 10*3/uL — ABNORMAL LOW (ref 150–400)
RBC: 4.04 MIL/uL (ref 3.87–5.11)
RBC: 3.83 MIL/uL — ABNORMAL LOW (ref 3.87–5.11)
RDW: 13 % (ref 11.5–15.5)
RDW: 13 % (ref 11.5–15.5)
WBC: 10.1 10*3/uL (ref 4.0–10.5)
WBC: 12 10*3/uL — ABNORMAL HIGH (ref 4.0–10.5)
nRBC: 0 % (ref 0.0–0.2)
nRBC: 0 % (ref 0.0–0.2)

## 2020-12-10 LAB — CBC WITH DIFFERENTIAL/PLATELET
Abs Immature Granulocytes: 0.07 10*3/uL (ref 0.00–0.07)
Basophils Absolute: 0 10*3/uL (ref 0.0–0.1)
Basophils Relative: 0 %
Eosinophils Absolute: 0 10*3/uL (ref 0.0–0.5)
Eosinophils Relative: 0 %
HCT: 33.6 % — ABNORMAL LOW (ref 36.0–46.0)
Hemoglobin: 12.2 g/dL (ref 12.0–15.0)
Immature Granulocytes: 1 %
Lymphocytes Relative: 13 %
Lymphs Abs: 1.5 10*3/uL (ref 0.7–4.0)
MCH: 34.7 pg — ABNORMAL HIGH (ref 26.0–34.0)
MCHC: 36.3 g/dL — ABNORMAL HIGH (ref 30.0–36.0)
MCV: 95.5 fL (ref 80.0–100.0)
Monocytes Absolute: 1.2 10*3/uL — ABNORMAL HIGH (ref 0.1–1.0)
Monocytes Relative: 10 %
Neutro Abs: 9.2 10*3/uL — ABNORMAL HIGH (ref 1.7–7.7)
Neutrophils Relative %: 76 %
Platelets: 134 10*3/uL — ABNORMAL LOW (ref 150–400)
RBC: 3.52 MIL/uL — ABNORMAL LOW (ref 3.87–5.11)
RDW: 13.1 % (ref 11.5–15.5)
WBC: 12 10*3/uL — ABNORMAL HIGH (ref 4.0–10.5)
nRBC: 0 % (ref 0.0–0.2)

## 2020-12-10 LAB — POCT ACTIVATED CLOTTING TIME
Activated Clotting Time: 0 seconds
Activated Clotting Time: 335 seconds
Activated Clotting Time: 341 seconds
Activated Clotting Time: 266 seconds

## 2020-12-10 SURGERY — CORONARY STENT INTERVENTION
Anesthesia: LOCAL

## 2020-12-10 MED ORDER — NITROGLYCERIN 1 MG/10 ML FOR IR/CATH LAB
INTRA_ARTERIAL | Status: AC
  Filled 2020-12-10: qty 10

## 2020-12-10 MED ORDER — METOPROLOL SUCCINATE ER 25 MG PO TB24
25.0000 mg | ORAL_TABLET | Freq: Every day | ORAL | Status: DC
  Administered 2020-12-10 – 2020-12-12 (×3): 25 mg via ORAL
  Filled 2020-12-10 (×4): qty 1

## 2020-12-10 MED ORDER — HEPARIN SODIUM (PORCINE) 1000 UNIT/ML IJ SOLN
INTRAMUSCULAR | Status: AC
  Filled 2020-12-10: qty 1

## 2020-12-10 MED ORDER — LABETALOL HCL 5 MG/ML IV SOLN: 10.0000 mg | INTRAVENOUS | Status: AC | PRN

## 2020-12-10 MED ORDER — FENTANYL CITRATE (PF) 100 MCG/2ML IJ SOLN
INTRAMUSCULAR | Status: AC
  Filled 2020-12-10: qty 2

## 2020-12-10 MED ORDER — FENTANYL CITRATE (PF) 100 MCG/2ML IJ SOLN
INTRAMUSCULAR | Status: DC | PRN
  Administered 2020-12-10: 25 ug via INTRAVENOUS

## 2020-12-10 MED ORDER — MIDAZOLAM HCL 2 MG/2ML IJ SOLN
INTRAMUSCULAR | Status: DC | PRN
  Administered 2020-12-10 (×2): 1 mg via INTRAVENOUS

## 2020-12-10 MED ORDER — ACETAMINOPHEN 325 MG PO TABS: 650.0000 mg | ORAL_TABLET | ORAL | Status: DC | PRN

## 2020-12-10 MED ORDER — HYDRALAZINE HCL 20 MG/ML IJ SOLN: 10.0000 mg | INTRAMUSCULAR | Status: AC | PRN

## 2020-12-10 MED ORDER — TIROFIBAN HCL IV 12.5 MG/250 ML
0.0750 ug/kg/min | INTRAVENOUS | Status: DC
  Filled 2020-12-10: qty 250

## 2020-12-10 MED ORDER — ASPIRIN 81 MG PO CHEW
81.0000 mg | CHEWABLE_TABLET | ORAL | Status: AC
  Administered 2020-12-10: 81 mg via ORAL

## 2020-12-10 MED ORDER — NITROGLYCERIN IN D5W 200-5 MCG/ML-% IV SOLN
INTRAVENOUS | Status: AC | PRN
  Administered 2020-12-10: 10 ug/min via INTRAVENOUS

## 2020-12-10 MED ORDER — LIDOCAINE HCL (PF) 1 % IJ SOLN
INTRAMUSCULAR | Status: AC
  Filled 2020-12-10: qty 30

## 2020-12-10 MED ORDER — MIDAZOLAM HCL 2 MG/2ML IJ SOLN
INTRAMUSCULAR | Status: AC
  Filled 2020-12-10: qty 2

## 2020-12-10 MED ORDER — SODIUM CHLORIDE 0.9 % IV SOLN: 250.0000 mL | INTRAVENOUS | Status: DC | PRN

## 2020-12-10 MED ORDER — NITROGLYCERIN 1 MG/10 ML FOR IR/CATH LAB
INTRA_ARTERIAL | Status: DC | PRN
  Administered 2020-12-10 (×4): 200 ug via INTRACORONARY

## 2020-12-10 MED ORDER — IOHEXOL 350 MG/ML SOLN
INTRAVENOUS | Status: DC | PRN
  Administered 2020-12-10: 145 mL

## 2020-12-10 MED ORDER — TIROFIBAN HCL IN NACL 5-0.9 MG/100ML-% IV SOLN
INTRAVENOUS | Status: AC | PRN
  Administered 2020-12-10: 0.15 ug/kg/min via INTRAVENOUS

## 2020-12-10 MED ORDER — FUROSEMIDE 10 MG/ML IJ SOLN
40.0000 mg | Freq: Once | INTRAMUSCULAR | Status: AC
  Administered 2020-12-10: 40 mg via INTRAVENOUS
  Filled 2020-12-10: qty 4

## 2020-12-10 MED ORDER — SODIUM CHLORIDE 0.9 % IV SOLN
INTRAVENOUS | Status: DC
  Administered 2020-12-10: 66 mL/h via INTRAVENOUS

## 2020-12-10 MED ORDER — ATORVASTATIN CALCIUM 80 MG PO TABS
80.0000 mg | ORAL_TABLET | Freq: Every day | ORAL | Status: DC
  Administered 2020-12-11 – 2020-12-12 (×2): 80 mg via ORAL
  Filled 2020-12-10 (×2): qty 1

## 2020-12-10 MED ORDER — TICAGRELOR 90 MG PO TABS: 90.0000 mg | ORAL_TABLET | Freq: Two times a day (BID) | ORAL | Status: DC

## 2020-12-10 MED ORDER — NITROGLYCERIN IN D5W 200-5 MCG/ML-% IV SOLN: 0.0000 ug/min | INTRAVENOUS | Status: DC

## 2020-12-10 MED ORDER — LIDOCAINE HCL (PF) 1 % IJ SOLN
INTRAMUSCULAR | Status: DC | PRN
  Administered 2020-12-10: 2 mL

## 2020-12-10 MED ORDER — NITROGLYCERIN IN D5W 200-5 MCG/ML-% IV SOLN
INTRAVENOUS | Status: AC
  Filled 2020-12-10: qty 250

## 2020-12-10 MED ORDER — TIROFIBAN HCL IN NACL 5-0.9 MG/100ML-% IV SOLN
INTRAVENOUS | Status: AC
  Filled 2020-12-10: qty 100

## 2020-12-10 MED ORDER — VERAPAMIL HCL 2.5 MG/ML IV SOLN
INTRAVENOUS | Status: AC
  Filled 2020-12-10: qty 2

## 2020-12-10 MED ORDER — ASPIRIN 81 MG PO CHEW
81.0000 mg | CHEWABLE_TABLET | Freq: Every day | ORAL | Status: DC
  Administered 2020-12-11 – 2020-12-12 (×2): 81 mg via ORAL
  Filled 2020-12-10 (×2): qty 1

## 2020-12-10 MED ORDER — SODIUM CHLORIDE 0.9% FLUSH: 3.0000 mL | Freq: Two times a day (BID) | INTRAVENOUS | Status: DC

## 2020-12-10 MED ORDER — SODIUM CHLORIDE 0.9% FLUSH: 3.0000 mL | INTRAVENOUS | Status: DC | PRN

## 2020-12-10 MED ORDER — ONDANSETRON HCL 4 MG/2ML IJ SOLN: 4.0000 mg | Freq: Four times a day (QID) | INTRAMUSCULAR | Status: DC | PRN

## 2020-12-10 MED ORDER — TIROFIBAN (AGGRASTAT) BOLUS VIA INFUSION
INTRAVENOUS | Status: DC | PRN
Start: 2020-12-10 — End: 2020-12-10
  Administered 2020-12-10: 1515 ug via INTRAVENOUS

## 2020-12-10 MED ORDER — VERAPAMIL HCL 2.5 MG/ML IV SOLN
INTRAVENOUS | Status: DC | PRN
  Administered 2020-12-10: 10 mL via INTRA_ARTERIAL

## 2020-12-10 MED ORDER — SODIUM CHLORIDE 0.9 % IV SOLN: INTRAVENOUS | Status: AC

## 2020-12-10 MED ORDER — HEPARIN (PORCINE) IN NACL 1000-0.9 UT/500ML-% IV SOLN
INTRAVENOUS | Status: AC
  Filled 2020-12-10: qty 1000

## 2020-12-10 MED ORDER — HEPARIN SODIUM (PORCINE) 1000 UNIT/ML IJ SOLN
INTRAMUSCULAR | Status: DC | PRN
  Administered 2020-12-10: 6500 [IU] via INTRAVENOUS
  Administered 2020-12-10: 1500 [IU] via INTRAVENOUS

## 2020-12-10 SURGICAL SUPPLY — 22 items
BALLN SAPPHIRE 1.5X12 (BALLOONS) ×2
BALLN SAPPHIRE 2.0X12 (BALLOONS) ×2
BALLN ~~LOC~~ EUPHORA RX 2.25X12 (BALLOONS) ×2
BALLOON SAPPHIRE 1.5X12 (BALLOONS) IMPLANT
BALLOON SAPPHIRE 2.0X12 (BALLOONS) IMPLANT
BALLOON ~~LOC~~ EUPHORA RX 2.25X12 (BALLOONS) IMPLANT
CATH VISTA GUIDE 6FR XB3 (CATHETERS) ×1 IMPLANT
CATH VISTA GUIDE 6FR XB3 SH (CATHETERS) ×1 IMPLANT
DEVICE RAD COMP TR BAND LRG (VASCULAR PRODUCTS) ×1 IMPLANT
ELECT DEFIB PAD ADLT CADENCE (PAD) ×1 IMPLANT
GLIDESHEATH SLEND SS 6F .021 (SHEATH) ×1 IMPLANT
GUIDEWIRE INQWIRE 1.5J.035X260 (WIRE) IMPLANT
INQWIRE 1.5J .035X260CM (WIRE) ×2
KIT ENCORE 26 ADVANTAGE (KITS) ×1 IMPLANT
KIT HEART LEFT (KITS) ×2 IMPLANT
PACK CARDIAC CATHETERIZATION (CUSTOM PROCEDURE TRAY) ×2 IMPLANT
SHEATH PROBE COVER 6X72 (BAG) ×1 IMPLANT
STENT ONYX FRONTIER 2.0X15 (Permanent Stent) ×1 IMPLANT
TRANSDUCER W/STOPCOCK (MISCELLANEOUS) ×2 IMPLANT
TUBING CIL FLEX 10 FLL-RA (TUBING) ×2 IMPLANT
WIRE COUGAR XT STRL 190CM (WIRE) ×1 IMPLANT
WIRE HI TORQ WHISPER MS 190CM (WIRE) ×1 IMPLANT

## 2020-12-10 NOTE — Progress Notes (Signed)
   Called by RN with patient having episode of bloody stool. She has been on aggrastat post cath as well as ASA/Brilinta. Blood pressure stable. TR band had oozing as well but stable at the time of assessment. Given IV lasix post cath for elevated LVEDP. She has had two separate trips to the bathroom to void but has not been measured. Feeling much better at the time of assessment. Aggrastat has been held at present.  -- Will recheck CBC. RN to monitor for recurrent hematochezia.   Barnet Pall, NP-C 12/10/2020, 3:37 PM Pager: [phone]

## 2020-12-10 NOTE — Progress Notes (Addendum)
Progress Note  Patient Name: Amber Peterson Date of Encounter: 12/10/2020  [facility] Cardiologist: None Dr. Harrell Gave  Patient Profile     65 y.o. female with PMH notable for hypertension and GAD as well as prior hep C who presented on 12/08/2020 with signs symptoms of anterior STEMI.  She underwent LAD PCI, and has existing disease in the LCx-plan for staged PCI.  Assessment & Plan    Principal Problem:   Acute ST elevation myocardial infarction (STEMI) due to occlusion of distal portion of left anterior descending (LAD) coronary artery (HCC) Active Problems:   Coronary artery disease involving native coronary artery of native heart with unstable angina pectoris (HCC)   Presence of drug coated stent in LAD coronary artery   Ischemic cardiomyopathy   GAD (generalized anxiety disorder) - with depression   Essential hypertension   Hyperlipidemia with target LDL less than 70    Acute ST elevation myocardial infarction (STEMI) due to occlusion of distal portion of left anterior descending (LAD) coronary artery (HCC) - Coronary artery disease involving native coronary artery of native heart with unstable angina pectoris (HCC) -->  Presence of drug coated stent in LAD coronary artery On DAPT: Aspirin and Brilinta.  Need to reassess dyspnea, may be related to Brilinta. Continue high-dose high intensity statin Converted home dose of Bystolic to Toprol based on low EF.  .  Ischemic cardiomyopathy with a EF of 30 to 35%: Converted to Toprol 50 mg from Bystolic -  Will reduce to 25 mg to allow for possible ARB/Spironolactone. With reduced EF & Anterior Akinesis - is at risk for sudden cardiac arrest -> will initiate evaluation for LifeVest with plan to recheck Echo in ~3 months post-cath.   Essential hypertension -> converted home dose Bystolic to Toprol 50 mg - consider reducing to 25 mg to allow addition of ARB or Spironolactone.  Currently blood pressures not high enough to  consider ARB.  We will reassess in the morning.  Consider potentially starting spironolactone at a minimum.    Hyperlipidemia with target LDL less than 70 -> 80 mg atorvastatin-> recheck lipids in 2 months   GAD (generalized anxiety disorder) - with depression: Currently on as needed Xanax, will restart home doses of trazodone and Effexor -Hypothyroidism: Restart home dose Synthroid, can recheck the TSH in the morning.  Restart PTA Asthma meds -> not sure if her dyspnea is related to Brilinta.  Potentially heart failure or asthma.  Dispo: Plan staged PCI of the LCx today.  Unfortunately currently blood pressure will not allow [place] to titrate her CHF medications today.  Can likely transfer to telemetry post cath, but may need 1-2 more days to titrate CHF meds.  Subjective   She has noted intermittent episodes of shortness of breath-difficulty catching her breath.  Also has had some mild occasional pain that seems to be somewhat epigastric in nature.  No sense of irregular heartbeats or palpitations.   Inpatient Medications    Scheduled Meds:  [MAR Hold] aspirin  81 mg Oral Daily   [MAR Hold] atorvastatin  80 mg Oral Q2000   [MAR Hold] Chlorhexidine Gluconate Cloth  6 each Topical Daily   [MAR Hold] fluticasone furoate-vilanterol  1 puff Inhalation Daily   [MAR Hold] levothyroxine  50 mcg Oral Q0600   [MAR Hold] metoprolol succinate  50 mg Oral Daily   [MAR Hold] nitroGLYCERIN  0.4 mg Sublingual Once   [MAR Hold] sodium chloride flush  3 mL Intravenous Q12H  sodium chloride flush  3 mL Intravenous Q12H   [MAR Hold] ticagrelor  90 mg Oral BID   [MAR Hold] traZODone  150 mg Oral QHS   [MAR Hold] venlafaxine XR  150 mg Oral Q breakfast   Continuous Infusions:  sodium chloride 20 mL/hr at 12/08/20 1047   [MAR Hold] sodium chloride     sodium chloride     sodium chloride 50 mL/hr at 12/10/20 0546   PRN Meds: [MAR Hold] sodium chloride, sodium chloride, [MAR Hold] acetaminophen, [MAR  Hold] albuterol, [MAR Hold] ALPRAZolam, [MAR Hold] alum & mag hydroxide-simeth, [MAR Hold]  morphine injection, [MAR Hold] ondansetron (ZOFRAN) IV, [MAR Hold] sodium chloride flush, sodium chloride flush   Vital Signs    Vitals:   12/10/20 0500 12/10/20 0600 12/10/20 0640 12/10/20 0700  BP: 106/69 (!) 103/54  93/70  Pulse: 83 77 82 81  Resp: '15 13  14  ' Temp:      TempSrc:      SpO2: 93% 95%  92%  Weight:   60.6 kg   Height:        Intake/Output Summary (Last 24 hours) at 12/10/2020 0737 Last data filed at 12/10/2020 0700 Gross per 24 hour  Intake 63.81 ml  Output --  Net 63.81 ml   Last 3 Weights 12/10/2020 12/08/2020 03/22/2019  Weight (lbs) 133 lb 9.6 oz 135 lb 131 lb 14.4 oz  Weight (kg) 60.601 kg 61.236 kg 59.829 kg      Telemetry    NSR in the 80s to 90s.  Minimal PVCs. - Personally Reviewed  ECG    No repeat EKG.- Personally Reviewed  Physical Exam   GEN: NAD.  Resting comfortably in bed.  Just very anxious. Neck: No JVD or carotid bruit., Cardiac: RRR, normal S1-S2.Marland Kitchen  No M/R/C. Respiratory: CTA B, nonlabored, good air movement.Wyonia Hough.  No W/R/R* GI: Soft/NT/ND/NABS.  No HSM. MS: No C/C/E.  Right renal cath site intact.  Minimal bruising.  Antecubital IV site bruising. Neuro: Grossly intact. Psych: Anxious but normal mood and affect.  Labs    High Sensitivity Troponin:   Recent Labs  Lab 12/08/20 1045 12/08/20 1427  TROPONINIHS 4,575* >24,000*      Chemistry Recent Labs  Lab 12/08/20 1045 12/09/20 0135  NA 135 135  K 3.6 4.4  CL 101 103  CO2 25 23  GLUCOSE 217* 138*  BUN 8 8  CREATININE 0.74 0.68  CALCIUM 9.1 9.4  PROT 8.5*  --   ALBUMIN 4.5  --   AST 82*  --   ALT 37  --   ALKPHOS 78  --   BILITOT 2.3*  --   GFRNONAA >60 >60  ANIONGAP 9 9     Hematology Recent Labs  Lab 12/08/20 1045 12/09/20 0135 12/10/20 0505  WBC 20.0* 23.6* 10.1  RBC 4.87 4.52 4.04  HGB 17.0* 15.7* 14.0  HCT 46.6* 42.3 38.1  MCV 95.7 93.6 94.3   MCH 34.9* 34.7* 34.7*  MCHC 36.5* 37.1* 36.7*  RDW 12.9 12.8 13.0  PLT 181 160 106*    BNPNo results for input(s): BNP, PROBNP in the last 168 hours.   DDimer No results for input(s): DDIMER in the last 168 hours.   Radiology    CARDIAC CATHETERIZATION  Result Date: 12/08/2020 Images from the original result were not included.   Prox RCA lesion is 30% stenosed.   Prox LAD to Mid LAD lesion is 100% stenosed.   Prox Cx to Mid Cx lesion  is 80% stenosed.   Mid Cx lesion is 99% stenosed.   A drug-eluting stent was successfully placed using a STENT ONYX FRONTIER 2.5X26.   Post intervention, there is a 0% residual stenosis. ASUKA DUSSEAU is a 65 y.o. female  [id number] LOCATION:  FACILITY: [facility] PHYSICIAN: Quay Burow, M.D. 29-Sep-1955 DATE OF PROCEDURE:  12/08/2020 DATE OF DISCHARGE: CARDIAC CATHETERIZATION / PCI DES mid LAD History obtained from chart review.  65 year old single Caucasian female mother of 1 child who lives in [place] without prior cardiac history.  She does have a history of treated hypertension.  She was awakened at approximately 330 this morning with chest pain and back pain rating to her shoulder.  She went to [facility] emergency room where an EKG showed anterior ST segment elevation with Q waves in her anterior precordial leads.  She was transferred at approximately 11:00 (approximately 8 hours after symptom onset) for urgent catheterization and potential intervention. PROCEDURE DESCRIPTION: The patient was brought to the second floor [facility]iac cath lab in the postabsorptive state.  She was premedicated with IV Benadryl and Solu-Medrol for contrast allergy prophylaxis.  She also received IV fentanyl.  Her right wrist was prepped and shaved in usual sterile fashion. Xylocaine 1% was used for local anesthesia. A 6 French sheath was inserted into the right radial artery using standard Seldinger technique. The patient received 3500 units  of heparin intravenously.  A 5  [place] TIG catheter and pigtail catheter used for selective coronary angiography and obtain left heart pressures.  Isovue dye was used for the entirety of the case (130 cc total administered to patient).  Retrograde aortic, ventricular and pullback pressures were recorded.  Radial cocktail was administered via the SideArm sheath. Patient was seen for total [place] units of heparin with an ACT of 387.  Isovue dye is used for the entirety of the intervention.  Retroaortic pressures monitored during the case.  She was hypertensive throughout the case.  180 mg of p.o. Brilinta was administered to the patient p.o. prior to intervention. Using a 6 [place] XB LAD 3.0 cm guide cath along with 0.14 [place] guidewire and a 2 mm x 12 m balloon the wire easily crossed the lesion providing antegrade flow with a door to balloon time of 37 minutes.  I then carefully positioned a 2.5 mm x 26 mm long Medtronic frontier drug-eluting stent and deployed at 14 atm.  I postdilated the entire stented segment with a 2.5 mm x 15 mm long noncompliant balloon at 18 atm (2.6 mm) resulting reduction of a total occlusion to 0% residual TIMI-3 flow.  I did give 200 mcg of intracoronary glycerin.  There was "pruning of the distal vessel. There was an 80% AV groove circumflex stenosis just before the takeoff of a large marginal branch with a 99% continuation of the AV groove circumflex.   [name] had an occluded LAD with a door to balloon time of 37 minutes and most likely a completed anterior STEMI although she was having residual back pain.  She did have Q waves in the anterior leads and restoration of blood flow occurred approximately 8 hours after symptom onset.  I began her on low-dose IV nitroglycerin for blood pressure control.  She will need guideline directed optimal medical therapy with beta-blockade plus or minus ACE/ARB depending on her LV function which we determined by 2D echocardiogram.  She has residual circumflex disease which  will be intervened on in a staged fashion.  The  sheath was removed and a TR band was placed on the right wrist to achieve patent hemostasis.  The patient left the lab in stable condition. Quay Burow. MD, [facility] 12/08/2020 12:50 PM    DG Chest Port 1 View  Result Date: 12/08/2020 CLINICAL DATA:  Chest pain EXAM: PORTABLE CHEST 1 VIEW COMPARISON:  Chest x-ray dated 06/08/2020 FINDINGS: Borderline cardiomegaly, stable. Pacer pad overlies the LEFT heart border. Upper mediastinum is within normal limits in configuration. Lungs are clear. No pleural effusion or pneumothorax is seen. No acute-appearing osseous abnormality. IMPRESSION: No active disease. No evidence of pneumonia or pulmonary edema. Borderline cardiomegaly. Electronically Signed   By: Franki Cabot M.D.   On: 12/08/2020 11:31   ECHOCARDIOGRAM COMPLETE  Result Date: 12/09/2020    ECHOCARDIOGRAM REPORT   Patient Name:   ADLER ALTON [facility] Date of Exam: 12/09/2020 Medical Rec #:  [id number]      Height:       59.0 in Accession #:    [id number]     Weight:       135.0 lb Date of Birth:  1956-02-13      BSA:          1.561 m Patient Age:    87 years       BP:           134/84 mmHg Patient Gender: F              HR:           94 bpm. Exam Location:  Inpatient Procedure: 2D Echo, Color Doppler and Cardiac Doppler Indications:    STEMI  History:        Patient has no prior history of Echocardiogram examinations.  Sonographer:    MH Referring Phys: [place]  1. Left ventricular ejection fraction, by estimation, is 30 to 35%. The left ventricle has moderately decreased function. The left ventricle demonstrates regional wall motion abnormalities with mid to apical anterior, anteroseptal, and inferoseptal akinesis, apical inferior akinesis, apical lateral akinesis, akinesis of the true apex. There is mild left ventricular hypertrophy. Left ventricular diastolic parameters are consistent with Grade I diastolic dysfunction (impaired  relaxation).  2. Right ventricular systolic function is normal. The right ventricular size is normal.  3. Left atrial size was mildly dilated.  4. The mitral valve is normal in structure. No evidence of mitral valve regurgitation. No evidence of mitral stenosis.  5. The aortic valve is tricuspid. Aortic valve regurgitation is not visualized. No aortic stenosis is present.  6. The inferior vena cava is normal in size with greater than 50% respiratory variability, suggesting right atrial pressure of 3 mmHg.  7. A small pericardial effusion is present. FINDINGS  Left Ventricle: Left ventricular ejection fraction, by estimation, is 30 to 35%. The left ventricle has moderately decreased function. The left ventricle demonstrates regional wall motion abnormalities. The left ventricular internal cavity size was normal in size. There is mild left ventricular hypertrophy. Left ventricular diastolic parameters are consistent with Grade I diastolic dysfunction (impaired relaxation). Right Ventricle: The right ventricular size is normal. No increase in right ventricular wall thickness. Right ventricular systolic function is normal. Left Atrium: Left atrial size was mildly dilated. Right Atrium: Right atrial size was normal in size. Pericardium: A small pericardial effusion is present. Mitral Valve: The mitral valve is normal in structure. There is mild calcification of the mitral valve leaflet(s). Mild mitral annular calcification. No evidence of mitral valve regurgitation. No evidence  of mitral valve stenosis. Tricuspid Valve: The tricuspid valve is normal in structure. Tricuspid valve regurgitation is trivial. Aortic Valve: The aortic valve is tricuspid. Aortic valve regurgitation is not visualized. No aortic stenosis is present. Aortic valve mean gradient measures 5.0 mmHg. Aortic valve peak gradient measures 8.2 mmHg. Aortic valve area, by VTI measures 1.50 cm. Pulmonic Valve: The pulmonic valve was normal in structure.  Pulmonic valve regurgitation is not visualized. Aorta: The aortic root is normal in size and structure. Venous: The inferior vena cava is normal in size with greater than 50% respiratory variability, suggesting right atrial pressure of 3 mmHg. IAS/Shunts: No atrial level shunt detected by color flow Doppler.  LEFT VENTRICLE PLAX 2D LVIDd:         3.50 cm     Diastology LVIDs:         2.50 cm     LV e' medial:    8.38 cm/s LV PW:         1.40 cm     LV E/e' medial:  9.3 LV IVS:        1.50 cm     LV e' lateral:   4.24 cm/s LVOT diam:     1.50 cm     LV E/e' lateral: 18.3 LV SV:         42 LV SV Index:   27 LVOT Area:     1.77 cm  LV Volumes (MOD) LV vol d, MOD A4C: 36.5 ml LV vol s, MOD A4C: 16.5 ml LV SV MOD A4C:     36.5 ml RIGHT VENTRICLE             IVC RV S prime:     17.60 cm/s  IVC diam: 1.60 cm TAPSE (M-mode): 1.8 cm LEFT ATRIUM             Index       RIGHT ATRIUM          Index LA diam:        3.10 cm 1.99 cm/m  RA Area:     5.59 cm LA Vol (A2C):   37.0 ml 23.71 ml/m RA Volume:   5.48 ml  3.51 ml/m LA Vol (A4C):   42.8 ml 27.42 ml/m LA Biplane Vol: 40.0 ml 25.63 ml/m  AORTIC VALVE AV Area (Vmax):    1.32 cm AV Area (Vmean):   1.42 cm AV Area (VTI):     1.50 cm AV Vmax:           143.00 cm/s AV Vmean:          103.000 cm/s AV VTI:            0.276 m AV Peak Grad:      8.2 mmHg AV Mean Grad:      5.0 mmHg LVOT Vmax:         107.00 cm/s LVOT Vmean:        82.500 cm/s LVOT VTI:          0.235 m LVOT/AV VTI ratio: 0.85  AORTA Ao Root diam: 2.10 cm Ao Asc diam:  2.70 cm MITRAL VALVE               TRICUSPID VALVE MV Area (PHT): 3.67 cm    TR Peak grad:   20.1 mmHg MV E velocity: 77.60 cm/s  TR Vmax:        224.00 cm/s MV A velocity: 85.90 cm/s MV E/A ratio:  0.90  SHUNTS                            Systemic VTI:  0.24 m                            Systemic Diam: 1.50 cm Dalton McleanMD Electronically signed by Franki Monte Signature Date/Time: 12/09/2020/1:53:40 PM    Final     Cardiac Studies    Cardiac Cath-PCI 12/08/2020: Proximal-mid LAD 100%-DES PCI Onyx Frontier 2.5 x 26; proximal RCA 30%.  Mid LCx bifurcation lesion 80% LCx, 99% AV groove (plan medical management for now with staged PCI) 2D echo 12/09/2020: EF 30 to 35% with mid to apical anterior/anteroseptal/inferoseptal akinesis as well as apical inferior and apical lateral akinesis.  GR 1 DD.  Mild LA dilation.  Normal valves.  Small pericardial effusion.  Normal valves    For questions or updates, please contact [facility] Please consult [email or web address] for contact info under        Signed, Glenetta Hew, MD  12/10/2020, 7:37 AM

## 2020-12-10 NOTE — Progress Notes (Addendum)
   Called by RN pt had another bloody stool.  Discussed with DR. Ellyn Hack. And will not resume aggrastat.   Will check CBC at 1800.  One is ordered for am as well.   BP has been stable though slight drop at 1900,  HR is stable.    Cecilie Kicks, FNP-C [place] [facility]  [phone] or after 5pm and on weekends call [phone] 12/10/2020.

## 2020-12-10 NOTE — H&P (View-Only) (Signed)
Progress Note  Patient Name: Amber Peterson Date of Encounter: 12/10/2020  [facility] Cardiologist: None Dr. Harrell Gave  Patient Profile     65 y.o. female with PMH notable for hypertension and GAD as well as prior hep C who presented on 12/08/2020 with signs symptoms of anterior STEMI.  She underwent LAD PCI, and has existing disease in the LCx-plan for staged PCI.  Assessment & Plan    Principal Problem:   Acute ST elevation myocardial infarction (STEMI) due to occlusion of distal portion of left anterior descending (LAD) coronary artery (HCC) Active Problems:   Coronary artery disease involving native coronary artery of native heart with unstable angina pectoris (HCC)   Presence of drug coated stent in LAD coronary artery   Ischemic cardiomyopathy   GAD (generalized anxiety disorder) - with depression   Essential hypertension   Hyperlipidemia with target LDL less than 70    Acute ST elevation myocardial infarction (STEMI) due to occlusion of distal portion of left anterior descending (LAD) coronary artery (HCC) - Coronary artery disease involving native coronary artery of native heart with unstable angina pectoris (HCC) -->  Presence of drug coated stent in LAD coronary artery On DAPT: Aspirin and Brilinta.  Need to reassess dyspnea, may be related to Brilinta. Continue high-dose high intensity statin Converted home dose of Bystolic to Toprol based on low EF.  .  Ischemic cardiomyopathy with a EF of 30 to 35%:  Essential hypertension -> converted home dose Bystolic to Toprol 50 mg.  Currently blood pressures not high enough to consider ARB.  We will reassess in the morning.  Consider potentially starting spironolactone at a minimum.    Hyperlipidemia with target LDL less than 70 -> 80 mg atorvastatin-> recheck lipids in 2 months   GAD (generalized anxiety disorder) - with depression: Currently on as needed Xanax, will restart home doses of trazodone and  Effexor -Hypothyroidism: Restart home dose Synthroid, can recheck the TSH in the morning.  Restart PTA Asthma meds -> not sure if her dyspnea is related to Brilinta.  Potentially heart failure or asthma.  Dispo: Plan staged PCI of the LCx today.  Unfortunately currently blood pressure will not allow [place] to titrate her CHF medications today.  Can likely transfer to telemetry post cath, but may need 1-2 more days to titrate CHF meds.  Subjective   She has noted intermittent episodes of shortness of breath-difficulty catching her breath.  Also has had some mild occasional pain that seems to be somewhat epigastric in nature.  No sense of irregular heartbeats or palpitations.   Inpatient Medications    Scheduled Meds:  [MAR Hold] aspirin  81 mg Oral Daily   [MAR Hold] atorvastatin  80 mg Oral Q2000   [MAR Hold] Chlorhexidine Gluconate Cloth  6 each Topical Daily   [MAR Hold] fluticasone furoate-vilanterol  1 puff Inhalation Daily   [MAR Hold] levothyroxine  50 mcg Oral Q0600   [MAR Hold] metoprolol succinate  50 mg Oral Daily   [MAR Hold] nitroGLYCERIN  0.4 mg Sublingual Once   [MAR Hold] sodium chloride flush  3 mL Intravenous Q12H   sodium chloride flush  3 mL Intravenous Q12H   [MAR Hold] ticagrelor  90 mg Oral BID   [MAR Hold] traZODone  150 mg Oral QHS   [MAR Hold] venlafaxine XR  150 mg Oral Q breakfast   Continuous Infusions:  sodium chloride 20 mL/hr at 12/08/20 1047   [MAR Hold] sodium chloride  sodium chloride     sodium chloride 50 mL/hr at 12/10/20 0546   PRN Meds: [MAR Hold] sodium chloride, sodium chloride, [MAR Hold] acetaminophen, [MAR Hold] albuterol, [MAR Hold] ALPRAZolam, [MAR Hold] alum & mag hydroxide-simeth, [MAR Hold]  morphine injection, [MAR Hold] ondansetron (ZOFRAN) IV, [MAR Hold] sodium chloride flush, sodium chloride flush   Vital Signs    Vitals:   12/10/20 0500 12/10/20 0600 12/10/20 0640 12/10/20 0700  BP: 106/69 (!) 103/54  93/70  Pulse: 83 77  82 81  Resp: _0 Temp:      TempSrc:      SpO2: 93% 95%  92%  Weight:   60.6 kg   Height:        Intake/Output Summary (Last 24 hours) at 12/10/2020 0737 Last data filed at 12/10/2020 0700 Gross per 24 hour  Intake 63.81 ml  Output --  Net 63.81 ml   Last 3 Weights 12/10/2020 12/08/2020 03/22/2019  Weight (lbs) 133 lb 9.6 oz 135 lb 131 lb 14.4 oz  Weight (kg) 60.601 kg 61.236 kg 59.829 kg      Telemetry    NSR in the 80s to 90s.  Minimal PVCs. - Personally Reviewed  ECG    No repeat EKG.- Personally Reviewed  Physical Exam   GEN: NAD.  Resting comfortably in bed.  Just very anxious. Neck: No JVD or carotid bruit., Cardiac: RRR, normal S1-S2.Marland Kitchen  No M/R/C. Respiratory: CTA B, nonlabored, good air movement.Amber Peterson.  No W/R/R* GI: Soft/NT/ND/NABS.  No HSM. MS: No C/C/E.  Right renal cath site intact.  Minimal bruising.  Antecubital IV site bruising. Neuro: Grossly intact. Psych: Anxious but normal mood and affect.  Labs    High Sensitivity Troponin:   Recent Labs  Lab 12/08/20 1045 12/08/20 1427  TROPONINIHS 4,575* >24,000*      Chemistry Recent Labs  Lab 12/08/20 1045 12/09/20 0135  NA 135 135  K 3.6 4.4  CL 101 103  CO2 25 23  GLUCOSE 217* 138*  BUN 8 8  CREATININE 0.74 0.68  CALCIUM 9.1 9.4  PROT 8.5*  --   ALBUMIN 4.5  --   AST 82*  --   ALT 37  --   ALKPHOS 78  --   BILITOT 2.3*  --   GFRNONAA >60 >60  ANIONGAP 9 9     Hematology Recent Labs  Lab 12/08/20 1045 12/09/20 0135 12/10/20 0505  WBC 20.0* 23.6* 10.1  RBC 4.87 4.52 4.04  HGB 17.0* 15.7* 14.0  HCT 46.6* 42.3 38.1  MCV 95.7 93.6 94.3  MCH 34.9* 34.7* 34.7*  MCHC 36.5* 37.1* 36.7*  RDW 12.9 12.8 13.0  PLT 181 160 106*    BNPNo results for input(s): BNP, PROBNP in the last 168 hours.   DDimer No results for input(s): DDIMER in the last 168 hours.   Radiology    CARDIAC CATHETERIZATION  Result Date: 12/08/2020 Images from the original result were not  included.   Prox RCA lesion is 30% stenosed.   Prox LAD to Mid LAD lesion is 100% stenosed.   Prox Cx to Mid Cx lesion is 80% stenosed.   Mid Cx lesion is 99% stenosed.   A drug-eluting stent was successfully placed using a STENT ONYX FRONTIER 2.5X26.   Post intervention, there is a 0% residual stenosis. Amber Peterson is a 65 y.o. female  [id number] LOCATION:  FACILITY: [facility] PHYSICIAN: Amber Peterson, M.D. 1955-10-10 DATE OF PROCEDURE:  12/08/2020 DATE OF DISCHARGE:  CARDIAC CATHETERIZATION / PCI DES mid LAD History obtained from chart review.  65 year old single Caucasian female mother of 1 child who lives in [place] without prior cardiac history.  She does have a history of treated hypertension.  She was awakened at approximately 330 this morning with chest pain and back pain rating to her shoulder.  She went to [facility] emergency room where an EKG showed anterior ST segment elevation with Q waves in her anterior precordial leads.  She was transferred at approximately 11:00 (approximately 8 hours after symptom onset) for urgent catheterization and potential intervention. PROCEDURE DESCRIPTION: The patient was brought to the second floor [facility]iac cath lab in the postabsorptive state.  She was premedicated with IV Benadryl and Solu-Medrol for contrast allergy prophylaxis.  She also received IV fentanyl.  Her right wrist was prepped and shaved in usual sterile fashion. Xylocaine 1% was used for local anesthesia. A 6 French sheath was inserted into the right radial artery using standard Seldinger technique. The patient received 3500 units  of heparin intravenously.  A 5 [place] TIG catheter and pigtail catheter used for selective coronary angiography and obtain left heart pressures.  Isovue dye was used for the entirety of the case (130 cc total administered to patient).  Retrograde aortic, ventricular and pullback pressures were recorded.  Radial cocktail was administered via the SideArm sheath. Patient  was seen for total [place] units of heparin with an ACT of 387.  Isovue dye is used for the entirety of the intervention.  Retroaortic pressures monitored during the case.  She was hypertensive throughout the case.  180 mg of p.o. Brilinta was administered to the patient p.o. prior to intervention. Using a 6 [place] XB LAD 3.0 cm guide cath along with 0.14 [place] guidewire and a 2 mm x 12 m balloon the wire easily crossed the lesion providing antegrade flow with a door to balloon time of 37 minutes.  I then carefully positioned a 2.5 mm x 26 mm long Medtronic frontier drug-eluting stent and deployed at 14 atm.  I postdilated the entire stented segment with a 2.5 mm x 15 mm long noncompliant balloon at 18 atm (2.6 mm) resulting reduction of a total occlusion to 0% residual TIMI-3 flow.  I did give 200 mcg of intracoronary glycerin.  There was "pruning of the distal vessel. There was an 80% AV groove circumflex stenosis just before the takeoff of a large marginal branch with a 99% continuation of the AV groove circumflex.   Ms. Amber Peterson had an occluded LAD with a door to balloon time of 37 minutes and most likely a completed anterior STEMI although she was having residual back pain.  She did have Q waves in the anterior leads and restoration of blood flow occurred approximately 8 hours after symptom onset.  I began her on low-dose IV nitroglycerin for blood pressure control.  She will need guideline directed optimal medical therapy with beta-blockade plus or minus ACE/ARB depending on her LV function which we determined by 2D echocardiogram.  She has residual circumflex disease which will be intervened on in a staged fashion.  The sheath was removed and a TR band was placed on the right wrist to achieve patent hemostasis.  The patient left the lab in stable condition. Amber Peterson. MD, [facility] 12/08/2020 12:50 PM    DG Chest Port 1 View  Result Date: 12/08/2020 CLINICAL DATA:  Chest pain EXAM: PORTABLE CHEST 1 VIEW  COMPARISON:  Chest x-ray dated 06/08/2020 FINDINGS:  Borderline cardiomegaly, stable. Pacer pad overlies the LEFT heart border. Upper mediastinum is within normal limits in configuration. Lungs are clear. No pleural effusion or pneumothorax is seen. No acute-appearing osseous abnormality. IMPRESSION: No active disease. No evidence of pneumonia or pulmonary edema. Borderline cardiomegaly. Electronically Signed   By: Franki Cabot M.D.   On: 12/08/2020 11:31   ECHOCARDIOGRAM COMPLETE  Result Date: 12/09/2020    ECHOCARDIOGRAM REPORT   Patient Name:   Amber Peterson [facility] Date of Exam: 12/09/2020 Medical Rec #:  [id number]      Height:       59.0 in Accession #:    [id number]     Weight:       135.0 lb Date of Birth:  05-16-1955      BSA:          1.561 m Patient Age:    38 years       BP:           134/84 mmHg Patient Gender: F              HR:           94 bpm. Exam Location:  Inpatient Procedure: 2D Echo, Color Doppler and Cardiac Doppler Indications:    STEMI  History:        Patient has no prior history of Echocardiogram examinations.  Sonographer:    MH Referring Phys: [facility]  1. Left ventricular ejection fraction, by estimation, is 30 to 35%. The left ventricle has moderately decreased function. The left ventricle demonstrates regional wall motion abnormalities with mid to apical anterior, anteroseptal, and inferoseptal akinesis, apical inferior akinesis, apical lateral akinesis, akinesis of the true apex. There is mild left ventricular hypertrophy. Left ventricular diastolic parameters are consistent with Grade I diastolic dysfunction (impaired relaxation).  2. Right ventricular systolic function is normal. The right ventricular size is normal.  3. Left atrial size was mildly dilated.  4. The mitral valve is normal in structure. No evidence of mitral valve regurgitation. No evidence of mitral stenosis.  5. The aortic valve is tricuspid. Aortic valve regurgitation is not visualized. No  aortic stenosis is present.  6. The inferior vena cava is normal in size with greater than 50% respiratory variability, suggesting right atrial pressure of 3 mmHg.  7. A small pericardial effusion is present. FINDINGS  Left Ventricle: Left ventricular ejection fraction, by estimation, is 30 to 35%. The left ventricle has moderately decreased function. The left ventricle demonstrates regional wall motion abnormalities. The left ventricular internal cavity size was normal in size. There is mild left ventricular hypertrophy. Left ventricular diastolic parameters are consistent with Grade I diastolic dysfunction (impaired relaxation). Right Ventricle: The right ventricular size is normal. No increase in right ventricular wall thickness. Right ventricular systolic function is normal. Left Atrium: Left atrial size was mildly dilated. Right Atrium: Right atrial size was normal in size. Pericardium: A small pericardial effusion is present. Mitral Valve: The mitral valve is normal in structure. There is mild calcification of the mitral valve leaflet(s). Mild mitral annular calcification. No evidence of mitral valve regurgitation. No evidence of mitral valve stenosis. Tricuspid Valve: The tricuspid valve is normal in structure. Tricuspid valve regurgitation is trivial. Aortic Valve: The aortic valve is tricuspid. Aortic valve regurgitation is not visualized. No aortic stenosis is present. Aortic valve mean gradient measures 5.0 mmHg. Aortic valve peak gradient measures 8.2 mmHg. Aortic valve area, by VTI measures 1.50 cm. Pulmonic Valve: The pulmonic  valve was normal in structure. Pulmonic valve regurgitation is not visualized. Aorta: The aortic root is normal in size and structure. Venous: The inferior vena cava is normal in size with greater than 50% respiratory variability, suggesting right atrial pressure of 3 mmHg. IAS/Shunts: No atrial level shunt detected by color flow Doppler.  LEFT VENTRICLE PLAX 2D LVIDd:          3.50 cm     Diastology LVIDs:         2.50 cm     LV e' medial:    8.38 cm/s LV PW:         1.40 cm     LV E/e' medial:  9.3 LV IVS:        1.50 cm     LV e' lateral:   4.24 cm/s LVOT diam:     1.50 cm     LV E/e' lateral: 18.3 LV SV:         42 LV SV Index:   27 LVOT Area:     1.77 cm  LV Volumes (MOD) LV vol d, MOD A4C: 36.5 ml LV vol s, MOD A4C: 16.5 ml LV SV MOD A4C:     36.5 ml RIGHT VENTRICLE             IVC RV S prime:     17.60 cm/s  IVC diam: 1.60 cm TAPSE (M-mode): 1.8 cm LEFT ATRIUM             Index       RIGHT ATRIUM          Index LA diam:        3.10 cm 1.99 cm/m  RA Area:     5.59 cm LA Vol (A2C):   37.0 ml 23.71 ml/m RA Volume:   5.48 ml  3.51 ml/m LA Vol (A4C):   42.8 ml 27.42 ml/m LA Biplane Vol: 40.0 ml 25.63 ml/m  AORTIC VALVE AV Area (Vmax):    1.32 cm AV Area (Vmean):   1.42 cm AV Area (VTI):     1.50 cm AV Vmax:           143.00 cm/s AV Vmean:          103.000 cm/s AV VTI:            0.276 m AV Peak Grad:      8.2 mmHg AV Mean Grad:      5.0 mmHg LVOT Vmax:         107.00 cm/s LVOT Vmean:        82.500 cm/s LVOT VTI:          0.235 m LVOT/AV VTI ratio: 0.85  AORTA Ao Root diam: 2.10 cm Ao Asc diam:  2.70 cm MITRAL VALVE               TRICUSPID VALVE MV Area (PHT): 3.67 cm    TR Peak grad:   20.1 mmHg MV E velocity: 77.60 cm/s  TR Vmax:        224.00 cm/s MV A velocity: 85.90 cm/s MV E/A ratio:  0.90        SHUNTS                            Systemic VTI:  0.24 m  Systemic Diam: 1.50 cm Dalton McleanMD Electronically signed by Franki Monte Signature Date/Time: 12/09/2020/1:53:40 PM    Final     Cardiac Studies   Cardiac Cath-PCI 12/08/2020: Proximal-mid LAD 100%-DES PCI Onyx Frontier 2.5 x 26; proximal RCA 30%.  Mid LCx bifurcation lesion 80% LCx, 99% AV groove (plan medical management for now with staged PCI) 2D echo 12/09/2020: EF 30 to 35% with mid to apical anterior/anteroseptal/inferoseptal akinesis as well as apical inferior and apical lateral  akinesis.  GR 1 DD.  Mild LA dilation.  Normal valves.  Small pericardial effusion.  Normal valves    For questions or updates, please contact [place] Please consult [email or web address] for contact info under        Signed, Glenetta Hew, MD  12/10/2020, 7:37 AM

## 2020-12-10 NOTE — Interval H&P Note (Signed)
Cath Lab Visit (complete for each Cath Lab visit)  Clinical Evaluation Leading to the Procedure:   ACS: Yes.    Non-ACS:    Anginal Classification: CCS III  Anti-ischemic medical therapy: Minimal Therapy (1 class of medications)  Non-Invasive Test Results: No non-invasive testing performed  Prior CABG: No previous CABG      History and Physical Interval Note:  12/10/2020 7:47 AM  Amber Peterson  has presented today for surgery, with the diagnosis of Coronary artery disease.  The various methods of treatment have been discussed with the patient and family. After consideration of risks, benefits and other options for treatment, the patient has consented to  Procedure(s): CORONARY STENT INTERVENTION (N/A) as a surgical intervention.  The patient's history has been reviewed, patient examined, no change in status, stable for surgery.  I have reviewed the patient's chart and labs.  Questions were answered to the patient's satisfaction.     Shelva Majestic

## 2020-12-10 NOTE — Progress Notes (Signed)
This RN noted that pts cardiac leads were off and entered room. Found pt in bathroom. Pt alert and oriented and family stating pt had passed blood in stool. This RN noted large amount of bright red blood in stool in toilet. Pt complaining of feeling fatigued. Pt helped back into bed by staff, pharmacy notified, Aggrastat turned off at 1424. TR band also noted to have oozing, 3 cc's of air added back into TR band and resolved. Reino Bellis NP notified, orders for blood work to be placed.

## 2020-12-11 LAB — CBC
HCT: 32 % — ABNORMAL LOW (ref 36.0–46.0)
HCT: 30 % — ABNORMAL LOW (ref 36.0–46.0)
Hemoglobin: 11.4 g/dL — ABNORMAL LOW (ref 12.0–15.0)
Hemoglobin: 10.8 g/dL — ABNORMAL LOW (ref 12.0–15.0)
MCH: 34 pg (ref 26.0–34.0)
MCH: 34.8 pg — ABNORMAL HIGH (ref 26.0–34.0)
MCHC: 35.6 g/dL (ref 30.0–36.0)
MCHC: 36 g/dL (ref 30.0–36.0)
MCV: 95.5 fL (ref 80.0–100.0)
MCV: 96.8 fL (ref 80.0–100.0)
Platelets: 104 10*3/uL — ABNORMAL LOW (ref 150–400)
Platelets: 97 10*3/uL — ABNORMAL LOW (ref 150–400)
RBC: 3.35 MIL/uL — ABNORMAL LOW (ref 3.87–5.11)
RBC: 3.1 MIL/uL — ABNORMAL LOW (ref 3.87–5.11)
RDW: 13 % (ref 11.5–15.5)
RDW: 13 % (ref 11.5–15.5)
WBC: 9.7 10*3/uL (ref 4.0–10.5)
WBC: 8.2 10*3/uL (ref 4.0–10.5)
nRBC: 0 % (ref 0.0–0.2)
nRBC: 0 % (ref 0.0–0.2)

## 2020-12-11 LAB — BASIC METABOLIC PANEL
Anion gap: 7 (ref 5–15)
BUN: 19 mg/dL (ref 8–23)
CO2: 24 mmol/L (ref 22–32)
Calcium: 8.2 mg/dL — ABNORMAL LOW (ref 8.9–10.3)
Chloride: 106 mmol/L (ref 98–111)
Creatinine, Ser: 0.91 mg/dL (ref 0.44–1.00)
GFR, Estimated: >60 mL/min (ref >60)
Glucose, Bld: 112 mg/dL — ABNORMAL HIGH (ref 70–99)
Potassium: 3.7 mmol/L (ref 3.5–5.1)
Sodium: 137 mmol/L (ref 135–145)

## 2020-12-11 MED ORDER — SPIRONOLACTONE 12.5 MG HALF TABLET
12.5000 mg | ORAL_TABLET | Freq: Every day | ORAL | Status: DC
  Administered 2020-12-11 – 2020-12-12 (×2): 12.5 mg via ORAL
  Filled 2020-12-11 (×2): qty 1

## 2020-12-11 MED FILL — Heparin Sod (Porcine)-NaCl IV Soln 1000 Unit/500ML-0.9%: INTRAVENOUS | Qty: 1000 | Status: AC

## 2020-12-11 NOTE — Progress Notes (Signed)
Cbc results paged to  MD Nipps.

## 2020-12-11 NOTE — Progress Notes (Signed)
Progress Note  Patient Name: Amber Peterson Date of Encounter: 12/11/2020  [facility] Cardiologist: None Dr. Harrell Gave  Patient Profile     65 y.o. female with PMH notable for hypertension and GAD as well as prior hep C who presented on 12/08/2020 with signs symptoms of anterior STEMI.  She underwent LAD PCI, and has existing disease in the LCx-plan for staged PCI.  Assessment & Plan    Principal Problem:   Acute ST elevation myocardial infarction (STEMI) due to occlusion of distal portion of left anterior descending (LAD) coronary artery (HCC) Active Problems:   Coronary artery disease involving native coronary artery of native heart with unstable angina pectoris (HCC)   Presence of drug coated stent in LAD coronary artery   Ischemic cardiomyopathy   GAD (generalized anxiety disorder) - with depression   Essential hypertension   Hyperlipidemia with target LDL less than 70    Acute ST elevation myocardial infarction (STEMI) due to occlusion of distal portion of left anterior descending (LAD) coronary artery (HCC) - Coronary artery disease involving native coronary artery of native heart with unstable angina pectoris (HCC) -->  Presence of drug coated stent in LAD and LCx coronary artery Staged PCI of the LCx with PTCA of the AV groove LCx yesterday.  Because of thrombus shift into the AV groove vessel, she was placed on Aggrastat.  This unfortunate was stopped due to bright red blood per rectum x2.  Thankfully, hemoglobin levels are relatively stable, and the most recent BM was brown and firm.  Aggrastat stopped.  We will simply continue DAPT. Continue ASA/Brilinta DAPT.  No longer having dyspnea issues with Brilinta.  . High-dose atorvastatin. Converted from Bystolic to Toprol, dose reduced to allow for addition of spironolactone today    Ischemic cardiomyopathy with a EF of 30 to 35%: Converted to Toprol 50 mg from Bystolic -reduce to 25 mg 12/10/2020 because of low blood  pressures. Will start low-dose spironolactone today with plans to start low-dose ARB tomorrow.  I do not think that her pressures will tolerate Entresto. Cost analysis for SGLT2 inhibitor-$50 co-pay.  Can potentially consider, but would preferably consider Entresto if able. Large anterior MI with anterior akinesis and reduced EF of 25 to 30%.  High risk for sudden cardiac arrest.  LifeVest ordered.  (See separate note.   Essential hypertension -> converted home dose Bystolic to Toprol 50 mg - consider reducing to 25 mg to allow addition of ARB or Spironolactone.  Blood pressure is a little better today, but not enough to start ARB, but will start spironolactone today and consider ARB tomorrow.  Low-dose losartan with consideration of possibly switching to Entresto if pressures tolerate in the outpatient setting.Marland Kitchen    Hyperlipidemia with target LDL less than 70 -> 80 mg atorvastatin-> recheck in 2 months to determine if continue with current dose.   GAD (generalized anxiety disorder) - with depression: PRN Xanax.  On home dose of trazodone and Effexor. -Hypothyroidism: On home dose Synthroid.  Restart PTA Asthma meds -> no longer noticing dyspnea.  Continue home meds.  Dispo: Okay to transfer to telemetry today.  Have consulted for LifeVest placement.  Anticipate that if her hemoglobin level is stable and blood pressures are stable tomorrow that we could potentially plan on discharge either tomorrow or the next day.  Subjective   No further episodes of bloody stools.  She had 2 episodes yesterday with minimal drop in hemoglobin.  Hemoglobin level stable today we will continue to  follow. Belly feels much better today.  No further migraines.  No chest pain or pressure. Is ready to get up and walk.  Inpatient Medications    Scheduled Meds:  aspirin  81 mg Oral Daily   atorvastatin  80 mg Oral Daily   Chlorhexidine Gluconate Cloth  6 each Topical Daily   fluticasone furoate-vilanterol  1 puff  Inhalation Daily   levothyroxine  50 mcg Oral Q0600   metoprolol succinate  25 mg Oral Daily   nitroGLYCERIN  0.4 mg Sublingual Once   sodium chloride flush  3 mL Intravenous Q12H   spironolactone  12.5 mg Oral Daily   ticagrelor  90 mg Oral BID   traZODone  150 mg Oral QHS   venlafaxine XR  150 mg Oral Q breakfast   Continuous Infusions:  sodium chloride 20 mL/hr at 12/08/20 1047   sodium chloride 50 mL/hr at 12/10/20 1626   sodium chloride     nitroGLYCERIN Stopped (12/10/20 1045)   PRN Meds: sodium chloride, acetaminophen, albuterol, ALPRAZolam, alum & mag hydroxide-simeth, morphine injection, ondansetron (ZOFRAN) IV, sodium chloride flush   Vital Signs    Vitals:   12/11/20 0800 12/11/20 0804 12/11/20 0852 12/11/20 0919  BP: 107/67   123/71  Pulse: 92   (!) 103  Resp: (!) 8     Temp:  98.1 F (36.7 C)    TempSrc:      SpO2: 96%  94%   Weight:      Height:        Intake/Output Summary (Last 24 hours) at 12/11/2020 1058 Last data filed at 12/11/2020 0730 Gross per 24 hour  Intake 1336.37 ml  Output 450 ml  Net 886.37 ml   Last 3 Weights 12/10/2020 12/08/2020 03/22/2019  Weight (lbs) 133 lb 9.6 oz 135 lb 131 lb 14.4 oz  Weight (kg) 60.601 kg 61.236 kg 59.829 kg      Telemetry    NSR, minimal PVCs.- Personally Reviewed  ECG    Sinus rhythm-88 bpm.  Evolutionary changes of anterior MI persistent.-> ST elevations persist in V2 through V5 with reciprocal of depression in II, remote 3 and aVF.  Minimal elevation in I.- Personally Reviewed  Physical Exam   GEN: Feels much better today.  Resting comfortably in bed.  No longer significantly anxious. Neck: No JVD or bruit. Cardiac: RRR, normal S1-S2, no M/R/G. Respiratory: CTA B, nonlabored good abdomen.  No W/R/R. GI: Soft/NT/ND/NABS.  No HSM. MS: No C/C/E.  Radial cath site CDI with no significant bruising. Neuro: grossly intact Psych: Much less anxious.  Feeling better.  Labs    High Sensitivity Troponin:    Recent Labs  Lab 12/08/20 1045 12/08/20 1427  TROPONINIHS 4,575* >24,000*      Chemistry Recent Labs  Lab 12/08/20 1045 12/09/20 0135 12/11/20 0508  NA 135 135 137  K 3.6 4.4 3.7  CL 101 103 106  CO2 '25 23 24  '$ GLUCOSE 217* 138* 112*  BUN '8 8 19  '$ CREATININE 0.74 0.68 0.91  CALCIUM 9.1 9.4 8.2*  PROT 8.5*  --   --   ALBUMIN 4.5  --   --   AST 82*  --   --   ALT 37  --   --   ALKPHOS 78  --   --   BILITOT 2.3*  --   --   GFRNONAA >60 >60 >60  ANIONGAP '9 9 7     '$ Hematology Recent Labs  Lab 12/10/20 1440 12/10/20 1939 12/11/20  0508  WBC 12.0* 12.0* 9.7  RBC 3.83* 3.52* 3.35*  HGB 13.3 12.2 11.4*  HCT 36.3 33.6* 32.0*  MCV 94.8 95.5 95.5  MCH 34.7* 34.7* 34.0  MCHC 36.6* 36.3* 35.6  RDW 13.0 13.1 13.0  PLT 131* 134* 104*    BNPNo results for input(s): BNP, PROBNP in the last 168 hours.   DDimer No results for input(s): DDIMER in the last 168 hours.   Radiology    CARDIAC CATHETERIZATION  Result Date: 12/10/2020   Colon Flattery Cx lesion is 30% stenosed.   Prox LAD lesion is 50% stenosed.   2nd Mrg lesion is 90% stenosed.   Mid Cx lesion is 99% stenosed.   Prox Cx to Mid Cx lesion is 25% stenosed.   Previously placed Prox LAD to Mid LAD stent (unknown type) is  widely patent.   A drug-eluting stent was successfully placed.   A stent was successfully placed.   Post intervention, there is a 0% residual stenosis.   Post intervention, there is a 20% residual stenosis.   Post intervention, there is a 0% residual stenosis. Short left main coronary artery which bifurcated into the LAD and left circumflex.  There is smooth 50% narrowing in the LAD proximal to the septal perforating artery and the previously placed LAD stent.  The LAD stent is widely patent. There was initial ostial narrowing of the circumflex with ST elevation with initial catheter engagement resulting in need for XB 3.0 guide catheter with sideholes.  The circumflex gave rise to a large second marginal vessel with  90% stenosis.  The small AV groove circumflex was 99% stenosed after marginal vessel takeoff. Successful PTCA stenting of the circumflex into the OM 2 vessel with ultimate insertion of a 2.0 x 15 mm Onyx frontier stent postdilated to 2.3 mm.  PTCA of the subtotal AV groove circumflex with a 1.5 x 12 mm balloon.  There was thrombus formation in the AV vessel which had increased after stenting into the marginal vessel.  The AV groove circumflex was rewired and successfully re-dilated.   Patient was started on Aggrastat bolus plus infusion with residual thrombus;  there was no evidence for dissection. LVEDP prior to intervention was 13 mmHg. RECOMMENDATION: DAPT for minimum of 1 year but probably longer.  Continue Aggrastat infusion 18 hours post procedure.  The patient will be maintained on low-dose IV nitroglycerin.  Aggressive lipid-lowering therapy abdominal blood pressure control.    Cardiac Studies   Cardiac Cath-PCI 12/08/2020: Proximal-mid LAD 100%-DES PCI Onyx Frontier 2.5 x 26; proximal RCA 30%.  Mid LCx bifurcation lesion 80% LCx, 99% AV groove (plan medical management for now with staged PCI) 2D echo 12/09/2020: EF 30 to 35% with mid to apical anterior/anteroseptal/inferoseptal akinesis as well as apical inferior and apical lateral akinesis.  GR 1 DD.  Mild LA dilation.  Normal valves.  Small pericardial effusion.  Normal valves Left Heart Cath with Coronary Angioplasty and DES PCI 12/11/2019: LVEDP 13 mmHg. Previously placed proximal-mid LAD stent widely patent with proximal LAD 50% stenosis prior to the stent. Mid AV groove LCx 99% just after OM 2 that has a 90% stenosis -> DES PCI of mid LCx-OM2 with Onyx Frontier 2.0 mm x 15 mm deployed to 2.2 mm; PTCA of AV groove LCx pre and post stent with residual thrombus despite inflation.  No suggestion of dissection. Started on Aggrastat with plans to for 18 hours. ->  Unfortunately, this was stopped due to bloody BM times 2 in the  afternoon. Diagnostic     Intervention      For questions or updates, please contact [place] Please consult [email or web address] for contact info under        Signed, Glenetta Hew, MD  12/11/2020, 10:58 AM

## 2020-12-11 NOTE — Progress Notes (Signed)
    Patient meets criteria for lifevest at discharge after presentation with STEMI.   Echo: 12/09/20  IMPRESSIONS     1. Left ventricular ejection fraction, by estimation, is 30 to 35%. The  left ventricle has moderately decreased function. The left ventricle  demonstrates regional wall motion abnormalities with mid to apical  anterior, anteroseptal, and inferoseptal  akinesis, apical inferior akinesis, apical lateral akinesis, akinesis of  the true apex. There is mild left ventricular hypertrophy. Left  ventricular diastolic parameters are consistent with Grade I diastolic  dysfunction (impaired relaxation).   2. Right ventricular systolic function is normal. The right ventricular  size is normal.   3. Left atrial size was mildly dilated.   4. The mitral valve is normal in structure. No evidence of mitral valve  regurgitation. No evidence of mitral stenosis.   5. The aortic valve is tricuspid. Aortic valve regurgitation is not  visualized. No aortic stenosis is present.   6. The inferior vena cava is normal in size with greater than 50%  respiratory variability, suggesting right atrial pressure of 3 mmHg.   7. A small pericardial effusion is present.   FINDINGS   Left Ventricle: Left ventricular ejection fraction, by estimation, is 30  to 35%. The left ventricle has moderately decreased function. The left  ventricle demonstrates regional wall motion abnormalities. The left  ventricular internal cavity size was  normal in size. There is mild left ventricular hypertrophy. Left  ventricular diastolic parameters are consistent with Grade I diastolic  dysfunction (impaired relaxation).   Right Ventricle: The right ventricular size is normal. No increase in  right ventricular wall thickness. Right ventricular systolic function is  normal.   Left Atrium: Left atrial size was mildly dilated.   Right Atrium: Right atrial size was normal in size.   Pericardium: A small pericardial  effusion is present.   Mitral Valve: The mitral valve is normal in structure. There is mild  calcification of the mitral valve leaflet(s). Mild mitral annular  calcification. No evidence of mitral valve regurgitation. No evidence of  mitral valve stenosis.   Tricuspid Valve: The tricuspid valve is normal in structure. Tricuspid  valve regurgitation is trivial.   Aortic Valve: The aortic valve is tricuspid. Aortic valve regurgitation is  not visualized. No aortic stenosis is present. Aortic valve mean gradient  measures 5.0 mmHg. Aortic valve peak gradient measures 8.2 mmHg. Aortic  valve area, by VTI measures 1.50  cm.   Pulmonic Valve: The pulmonic valve was normal in structure. Pulmonic valve  regurgitation is not visualized.   Aorta: The aortic root is normal in size and structure.   Venous: The inferior vena cava is normal in size with greater than 50%  respiratory variability, suggesting right atrial pressure of 3 mmHg.   IAS/Shunts: No atrial level shunt detected by color flow Doppler.   -- orders placed in Epic and [employer] rep notified.   Barnet Pall, NP-C 12/11/2020, 10:30 AM Pager: [phone]

## 2020-12-11 NOTE — Progress Notes (Signed)
Pt arrived to 4e from 2h. Pt oriented to room and staff. Vitals obtained and stable. CHG bath done. Telemetry box applied and CCMD verified x2 verifiers.

## 2020-12-11 NOTE — Progress Notes (Signed)
CARDIAC REHAB PHASE I   PRE:  Rate/Rhythm: 101 ST  BP:  Supine: 131/75  Sitting:   Standing:    SaO2: 97%RA  MODE:  Ambulation: 370 ft   POST:  Rate/Rhythm: 120 ST  BP:  Supine:   Sitting: 129/80  Standing:    SaO2: 96%RA 1330-1420 Pt walked 370 ft on RA with steady gait and no c/o CP. Tolerated well. To bed after walk. Gave pt CHF booklet and discussed importance of daily weights, 2000 mg sodium restriction and 2 LFR. Reviewed signs/symptoms of CHF and when to call MD. Pt voiced understanding.    Graylon Good, RN BSN  12/11/2020 2:18 PM

## 2020-12-11 NOTE — Care Management (Signed)
1239 12-11-20 Case Manager received call from [employer] rep regarding life vest. Information was printed to the unit and the unit secretary faxed the information to [employer]. Awaiting insurance authorization. No further needs identified at this time.

## 2020-12-12 ENCOUNTER — Other Ambulatory Visit: Payer: Self-pay | Admitting: Cardiology

## 2020-12-12 ENCOUNTER — Other Ambulatory Visit (HOSPITAL_COMMUNITY): Payer: Self-pay

## 2020-12-12 DIAGNOSIS — D649 Anemia, unspecified: Secondary | ICD-10-CM

## 2020-12-12 LAB — CBC
HCT: 29.3 % — ABNORMAL LOW (ref 36.0–46.0)
Hemoglobin: 10.7 g/dL — ABNORMAL LOW (ref 12.0–15.0)
MCH: 35 pg — ABNORMAL HIGH (ref 26.0–34.0)
MCHC: 36.5 g/dL — ABNORMAL HIGH (ref 30.0–36.0)
MCV: 95.8 fL (ref 80.0–100.0)
Platelets: 86 10*3/uL — ABNORMAL LOW (ref 150–400)
RBC: 3.06 MIL/uL — ABNORMAL LOW (ref 3.87–5.11)
RDW: 13.1 % (ref 11.5–15.5)
WBC: 6.6 10*3/uL (ref 4.0–10.5)
nRBC: 0 % (ref 0.0–0.2)

## 2020-12-12 MED ORDER — ENTRESTO 24-26 MG PO TABS
1.0000 | ORAL_TABLET | Freq: Two times a day (BID) | ORAL | 11 refills | Status: DC
  Filled 2020-12-12: qty 60, 30d supply, fill #0

## 2020-12-12 MED ORDER — ATORVASTATIN CALCIUM 80 MG PO TABS
80.0000 mg | ORAL_TABLET | Freq: Every day | ORAL | 3 refills | Status: DC
  Filled 2020-12-12: qty 90, 90d supply, fill #0

## 2020-12-12 MED ORDER — SPIRONOLACTONE 25 MG PO TABS
12.5000 mg | ORAL_TABLET | Freq: Every day | ORAL | 11 refills | Status: DC
  Filled 2020-12-12: qty 30, 60d supply, fill #0

## 2020-12-12 MED ORDER — NITROGLYCERIN 0.4 MG SL SUBL
0.4000 mg | SUBLINGUAL_TABLET | SUBLINGUAL | 1 refills | Status: AC | PRN
Start: 2020-12-12 — End: 2021-12-12
  Filled 2020-12-12: qty 25, 6d supply, fill #0

## 2020-12-12 MED ORDER — LOSARTAN POTASSIUM 25 MG PO TABS
25.0000 mg | ORAL_TABLET | Freq: Every day | ORAL | Status: DC
  Administered 2020-12-12: 25 mg via ORAL
  Filled 2020-12-12: qty 1

## 2020-12-12 MED ORDER — TICAGRELOR 90 MG PO TABS
90.0000 mg | ORAL_TABLET | Freq: Two times a day (BID) | ORAL | 11 refills | Status: DC
  Filled 2020-12-12: qty 60, 30d supply, fill #0

## 2020-12-12 MED ORDER — METOPROLOL SUCCINATE ER 25 MG PO TB24
25.0000 mg | ORAL_TABLET | Freq: Every day | ORAL | 11 refills | Status: DC
  Filled 2020-12-12: qty 30, 30d supply, fill #0

## 2020-12-12 MED ORDER — ASPIRIN 81 MG PO TBEC
81.0000 mg | DELAYED_RELEASE_TABLET | Freq: Every day | ORAL | 3 refills | Status: DC
  Filled 2020-12-12: qty 90, 90d supply, fill #0

## 2020-12-12 NOTE — Discharge Summary (Addendum)
Discharge Summary    Patient ID: Amber Peterson MRN: [id number]; DOB: 1956-03-07  Admit date: 12/08/2020 Discharge date: 12/12/2020  PCP:  No primary care provider on file.   [facility] Providers Cardiologist:  Glenetta Hew, MD     Discharge Diagnoses    Principal Problem:   Acute ST elevation myocardial infarction (STEMI) due to occlusion of distal portion of left anterior descending (LAD) coronary artery [facility]) Active Problems:   Essential hypertension   Coronary artery disease involving native coronary artery of native heart with unstable angina pectoris (HCC)   Presence of drug coated stent in LAD coronary artery   Hyperlipidemia with target LDL less than 70   GAD (generalized anxiety disorder) - with depression   Ischemic cardiomyopathy    Diagnostic Studies/Procedures    Cath: 12/08/20  IMPRESSION: Ms. Amber Peterson had an occluded LAD with a door to balloon time of 37 minutes and most likely a completed anterior STEMI although she was having residual back pain.  She did have Q waves in the anterior leads and restoration of blood flow occurred approximately 8 hours after symptom onset.  I began her on low-dose IV nitroglycerin for blood pressure control.  She will need guideline directed optimal medical therapy with beta-blockade plus or minus ACE/ARB depending on her LV function which we determined by 2D echocardiogram.  She has residual circumflex disease which will be intervened on in a staged fashion.  The sheath was removed and a TR band was placed on the right wrist to achieve patent hemostasis.  The patient left the lab in stable condition.   Quay Burow. MD, [facility] 12/08/2020  Diagnostic Dominance: Right Intervention    Cath: 12/10/20    Ost Cx lesion is 30% stenosed.   Prox LAD lesion is 50% stenosed.   2nd Mrg lesion is 90% stenosed.   Mid Cx lesion is 99% stenosed.   Prox Cx to Mid Cx lesion is 25% stenosed.   Previously placed Prox LAD to Mid LAD stent  (unknown type) is  widely patent.   A drug-eluting stent was successfully placed.   A stent was successfully placed.   Post intervention, there is a 0% residual stenosis.   Post intervention, there is a 20% residual stenosis.   Post intervention, there is a 0% residual stenosis.   Short left main coronary artery which bifurcated into the LAD and left circumflex.  There is smooth 50% narrowing in the LAD proximal to the septal perforating artery and the previously placed LAD stent.  The LAD stent is widely patent.   There was initial ostial narrowing of the circumflex with ST elevation with initial catheter engagement resulting in need for XB 3.0 guide catheter with sideholes.  The circumflex gave rise to a large second marginal vessel with 90% stenosis.  The small AV groove circumflex was 99% stenosed after marginal vessel takeoff.   Successful PTCA stenting of the circumflex into the OM 2 vessel with ultimate insertion of a 2.0 x 15 mm Onyx frontier stent postdilated to 2.3 mm.  PTCA of the subtotal AV groove circumflex with a 1.5 x 12 mm balloon.  There was thrombus formation in the AV vessel which had increased after stenting into the marginal vessel.  The AV groove circumflex was rewired and successfully re-dilated.   Patient was started on Aggrastat bolus plus infusion with residual thrombus;  there was no evidence for dissection.   LVEDP prior to intervention was 13 mmHg.   RECOMMENDATION: DAPT for minimum  of 1 year but probably longer.  Continue Aggrastat infusion 18 hours post procedure.  The patient will be maintained on low-dose IV nitroglycerin.  Aggressive lipid-lowering therapy abdominal blood pressure control.   Diagnostic Dominance: Right Intervention    Echo: 12/09/20  IMPRESSIONS     1. Left ventricular ejection fraction, by estimation, is 30 to 35%. The  left ventricle has moderately decreased function. The left ventricle  demonstrates regional wall motion  abnormalities with mid to apical  anterior, anteroseptal, and inferoseptal  akinesis, apical inferior akinesis, apical lateral akinesis, akinesis of  the true apex. There is mild left ventricular hypertrophy. Left  ventricular diastolic parameters are consistent with Grade I diastolic  dysfunction (impaired relaxation).   2. Right ventricular systolic function is normal. The right ventricular  size is normal.   3. Left atrial size was mildly dilated.   4. The mitral valve is normal in structure. No evidence of mitral valve  regurgitation. No evidence of mitral stenosis.   5. The aortic valve is tricuspid. Aortic valve regurgitation is not  visualized. No aortic stenosis is present.   6. The inferior vena cava is normal in size with greater than 50%  respiratory variability, suggesting right atrial pressure of 3 mmHg.   7. A small pericardial effusion is present.   FINDINGS   Left Ventricle: Left ventricular ejection fraction, by estimation, is 30  to 35%. The left ventricle has moderately decreased function. The left  ventricle demonstrates regional wall motion abnormalities. The left  ventricular internal cavity size was  normal in size. There is mild left ventricular hypertrophy. Left  ventricular diastolic parameters are consistent with Grade I diastolic  dysfunction (impaired relaxation).   Right Ventricle: The right ventricular size is normal. No increase in  right ventricular wall thickness. Right ventricular systolic function is  normal.   Left Atrium: Left atrial size was mildly dilated.   Right Atrium: Right atrial size was normal in size.   Pericardium: A small pericardial effusion is present.   Mitral Valve: The mitral valve is normal in structure. There is mild  calcification of the mitral valve leaflet(s). Mild mitral annular  calcification. No evidence of mitral valve regurgitation. No evidence of  mitral valve stenosis.   Tricuspid Valve: The tricuspid valve is  normal in structure. Tricuspid  valve regurgitation is trivial.   Aortic Valve: The aortic valve is tricuspid. Aortic valve regurgitation is  not visualized. No aortic stenosis is present. Aortic valve mean gradient  measures 5.0 mmHg. Aortic valve peak gradient measures 8.2 mmHg. Aortic  valve area, by VTI measures 1.50  cm.   Pulmonic Valve: The pulmonic valve was normal in structure. Pulmonic valve  regurgitation is not visualized.   Aorta: The aortic root is normal in size and structure.   Venous: The inferior vena cava is normal in size with greater than 50%  respiratory variability, suggesting right atrial pressure of 3 mmHg.   IAS/Shunts: No atrial level shunt detected by color flow Doppler.  _____________   History of Present Illness     Amber Peterson is a 65 y.o. female with history of hypertension and hepatitis C who is being seen 12/08/2020 for the evaluation of chest pain consistent with STEMI.  Ms. Vigorito denied prior cardiac history.  She was in her usual state of health up until the morning of admission when she woke up that morning around 3:30 AM with severe substernal chest pressure.  No diaphoresis or nausea.  Radiating to  her back.  She went to [facility], ER where EKG showed anterior STEMI.  She was given aspirin, nitroglycerin and heparin bolus.  Upon arrival to [facility] she was hemodynamically stable.  Taken to Cath Lab for emergent cardiac catheterization.   High-sensitivity troponin 4575 Creatinine and electrolytes normal LDL 135 WBC 20 Respiratory panel negative for COVID and influenza Chest x-ray with cardiomegaly  Hospital Course     STEMI: Underwent cardiac cath noted above with PCI/DES x1 to the LAD. Staged PCI of the LCx with PTCA of the AV groove LCx. Because of thrombus shift into the AV groove vessel, she was placed on Aggrastat.  This was stopped due to bright red blood per rectum x2.  Hemoglobin levels are relatively stable.   -- Continue  ASA/Brilinta DAPT.  No longer having dyspnea issues with Brilinta.  -- High-dose atorvastatin. -- Converted from Bystolic to Toprol, dose reduced to allow for addition of spironolactone today  Ischemic cardiomyopathy with a EF of 30 to 35%: Converted to Toprol 50 mg from Bystolic -reduced to 25 mg 12/10/2020 because of low blood pressures. -- started on low-dose spironolactone as well as low-dose ARB which was converted to Entresto 24/26 BID prior to discharge.  -- considered SGLT2 but even cost around $50/month will initiate Entresto first -- Large anterior MI with anterior akinesis and reduced EF of 25 to 30%.  High risk for sudden cardiac arrest.  LifeVest ordered and placed prior to discharge   GI bleed with anemia: had a total of 3 bright red blood per rectum episodes during admission, in the setting aggrastat/asa/brilinta. Hgb dropped slight from 12.2>>11.4>>10.7 but remained stable.  -- plan for outpatient CBC next week  HTN:  converted home dose Bystolic to Toprol 50 mg -- continue spiro 12.19m daily along with Entresto 24/254mBID  Hyperlipidemia: started on 80 mg atorvastatin -- FLP/LFTs in 8 weeks  GAD: with depression: PRN Xanax. Continue on home dose of trazodone and Effexor.  Hypothyroidism: On home dose Synthroid.  General: Well developed, well nourished, female appearing in no acute distress. Head: Normocephalic, atraumatic.  Neck: Supple without bruits, JVD. Lungs:  Resp regular and unlabored, CTA. Heart: RRR, S1, S2, no S3, S4, or murmur; no rub. Abdomen: Soft, non-tender, non-distended with normoactive bowel sounds. No hepatomegaly. No rebound/guarding. No obvious abdominal masses. Extremities: No clubbing, cyanosis, edema. Distal pedal pulses are 2+ bilaterally. Right cath site stable without bruising or hematoma Neuro: Alert and oriented X 3. Moves all extremities spontaneously. Psych: Normal affect.  Patient was seen by Dr. HaEllyn Hacknd deemed stable for discharge  home. Follow up in the office has been arranged. [facility] to [facility].   Did the patient have an acute coronary syndrome (MI, NSTEMI, STEMI, etc) this admission?:  Yes                               AHA/ACC Clinical Performance & Quality Measures: Aspirin prescribed? - Yes ADP Receptor Inhibitor (Plavix/Clopidogrel, Brilinta/Ticagrelor or Effient/Prasugrel) prescribed (includes medically managed patients)? - Yes Beta Blocker prescribed? - Yes High Intensity Statin (Lipitor 40-8061mr Crestor 20-43m55mrescribed? - Yes EF assessed during THIS hospitalization? - Yes For EF <40%, was ACEI/ARB prescribed? - Yes For EF <40%, Aldosterone Antagonist (Spironolactone or Eplerenone) prescribed? - Yes Cardiac Rehab Phase II ordered (including medically managed patients)? - Yes      _____________  Discharge Vitals Blood pressure (!)Marland Kitchen  134/55, pulse 100, temperature 98.2 F (36.8 C), temperature source Oral, resp. rate 14, height 4' 11" (1.499 m), weight 60.6 kg, SpO2 98 %.  Filed Weights   12/08/20 1043 12/10/20 0640  Weight: 61.2 kg 60.6 kg    Labs & Radiologic Studies    CBC Recent Labs    12/10/20 1939 12/11/20 0508 12/11/20 1939 12/12/20 0144  WBC 12.0*   < > 8.2 6.6  NEUTROABS 9.2*  --   --   --   HGB 12.2   < > 10.8* 10.7*  HCT 33.6*   < > 30.0* 29.3*  MCV 95.5   < > 96.8 95.8  PLT 134*   < > 97* 86*   < > = values in this interval not displayed.   Basic Metabolic Panel Recent Labs    12/11/20 0508  NA 137  K 3.7  CL 106  CO2 24  GLUCOSE 112*  BUN 19  CREATININE 0.91  CALCIUM 8.2*   Liver Function Tests No results for input(s): AST, ALT, ALKPHOS, BILITOT, PROT, ALBUMIN in the last 72 hours. No results for input(s): LIPASE, AMYLASE in the last 72 hours. High Sensitivity Troponin:   Recent Labs  Lab 12/08/20 1045 12/08/20 1427  TROPONINIHS 4,575* >24,000*    BNP Invalid input(s): POCBNP D-Dimer No results for  input(s): DDIMER in the last 72 hours. Hemoglobin A1C No results for input(s): HGBA1C in the last 72 hours. Fasting Lipid Panel No results for input(s): CHOL, HDL, LDLCALC, TRIG, CHOLHDL, LDLDIRECT in the last 72 hours. Thyroid Function Tests No results for input(s): TSH, T4TOTAL, T3FREE, THYROIDAB in the last 72 hours.  Invalid input(s): FREET3 _____________  CARDIAC CATHETERIZATION  Result Date: 12/10/2020   Colon [place] Cx lesion is 30% stenosed.   Prox LAD lesion is 50% stenosed.   2nd Mrg lesion is 90% stenosed.   Mid Cx lesion is 99% stenosed.   Prox Cx to Mid Cx lesion is 25% stenosed.   Previously placed Prox LAD to Mid LAD stent (unknown type) is  widely patent.   A drug-eluting stent was successfully placed.   A stent was successfully placed.   Post intervention, there is a 0% residual stenosis.   Post intervention, there is a 20% residual stenosis.   Post intervention, there is a 0% residual stenosis. Short left main coronary artery which bifurcated into the LAD and left circumflex.  There is smooth 50% narrowing in the LAD proximal to the septal perforating artery and the previously placed LAD stent.  The LAD stent is widely patent. There was initial ostial narrowing of the circumflex with ST elevation with initial catheter engagement resulting in need for XB 3.0 guide catheter with sideholes.  The circumflex gave rise to a large second marginal vessel with 90% stenosis.  The small AV groove circumflex was 99% stenosed after marginal vessel takeoff. Successful PTCA stenting of the circumflex into the OM 2 vessel with ultimate insertion of a 2.0 x 15 mm Onyx frontier stent postdilated to 2.3 mm.  PTCA of the subtotal AV groove circumflex with a 1.5 x 12 mm balloon.  There was thrombus formation in the AV vessel which had increased after stenting into the marginal vessel.  The AV groove circumflex was rewired and successfully re-dilated.   Patient was started on Aggrastat bolus plus infusion with  residual thrombus;  there was no evidence for dissection. LVEDP prior to intervention was 13 mmHg. RECOMMENDATION: DAPT for minimum of 1 year but probably longer.  Continue  Aggrastat infusion 18 hours post procedure.  The patient will be maintained on low-dose IV nitroglycerin.  Aggressive lipid-lowering therapy abdominal blood pressure control.   CARDIAC CATHETERIZATION  Result Date: 12/08/2020 Images from the original result were not included.   Prox RCA lesion is 30% stenosed.   Prox LAD to Mid LAD lesion is 100% stenosed.   Prox Cx to Mid Cx lesion is 80% stenosed.   Mid Cx lesion is 99% stenosed.   A drug-eluting stent was successfully placed using a STENT ONYX FRONTIER 2.5X26.   Post intervention, there is a 0% residual stenosis. CORNIE MCCOMBER is a 65 y.o. female  [id number] LOCATION:  FACILITY: [facility] PHYSICIAN: Quay Burow, M.D. Feb 18, 1956 DATE OF PROCEDURE:  12/08/2020 DATE OF DISCHARGE: CARDIAC CATHETERIZATION / PCI DES mid LAD History obtained from chart review.  65 year old single Caucasian female mother of 1 child who lives in [place] without prior cardiac history.  She does have a history of treated hypertension.  She was awakened at approximately 330 this morning with chest pain and back pain rating to her shoulder.  She went to [facility] emergency room where an EKG showed anterior ST segment elevation with Q waves in her anterior precordial leads.  She was transferred at approximately 11:00 (approximately 8 hours after symptom onset) for urgent catheterization and potential intervention. PROCEDURE DESCRIPTION: The patient was brought to the second floor [place] [facility]iac cath lab in the postabsorptive state.  She was premedicated with IV Benadryl and Solu-Medrol for contrast allergy prophylaxis.  She also received IV fentanyl.  Her right wrist was prepped and shaved in usual sterile fashion. Xylocaine 1% was used for local anesthesia. A 6 French sheath was inserted into the right radial  artery using standard Seldinger technique. The patient received 3500 units  of heparin intravenously.  A 5 [place] TIG catheter and pigtail catheter used for selective coronary angiography and obtain left heart pressures.  Isovue dye was used for the entirety of the case (130 cc total administered to patient).  Retrograde aortic, ventricular and pullback pressures were recorded.  Radial cocktail was administered via the SideArm sheath. Patient was seen for total [place] units of heparin with an ACT of 387.  Isovue dye is used for the entirety of the intervention.  Retroaortic pressures monitored during the case.  She was hypertensive throughout the case.  180 mg of p.o. Brilinta was administered to the patient p.o. prior to intervention. Using a 6 [place] XB LAD 3.0 cm guide cath along with 0.14 [place] guidewire and a 2 mm x 12 m balloon the wire easily crossed the lesion providing antegrade flow with a door to balloon time of 37 minutes.  I then carefully positioned a 2.5 mm x 26 mm long Medtronic frontier drug-eluting stent and deployed at 14 atm.  I postdilated the entire stented segment with a 2.5 mm x 15 mm long noncompliant balloon at 18 atm (2.6 mm) resulting reduction of a total occlusion to 0% residual TIMI-3 flow.  I did give 200 mcg of intracoronary glycerin.  There was "pruning of the distal vessel. There was an 80% AV groove circumflex stenosis just before the takeoff of a large marginal branch with a 99% continuation of the AV groove circumflex.   Ms. Amber Peterson had an occluded LAD with a door to balloon time of 37 minutes and most likely a completed anterior STEMI although she was having residual back pain.  She did have Q waves in the anterior leads and  restoration of blood flow occurred approximately 8 hours after symptom onset.  I began her on low-dose IV nitroglycerin for blood pressure control.  She will need guideline directed optimal medical therapy with beta-blockade plus or minus ACE/ARB depending  on her LV function which we determined by 2D echocardiogram.  She has residual circumflex disease which will be intervened on in a staged fashion.  The sheath was removed and a TR band was placed on the right wrist to achieve patent hemostasis.  The patient left the lab in stable condition. Quay Burow. MD, [facility] 12/08/2020 12:50 PM    DG Chest Port 1 View  Result Date: 12/08/2020 CLINICAL DATA:  Chest pain EXAM: PORTABLE CHEST 1 VIEW COMPARISON:  Chest x-ray dated 06/08/2020 FINDINGS: Borderline cardiomegaly, stable. Pacer pad overlies the LEFT heart border. Upper mediastinum is within normal limits in configuration. Lungs are clear. No pleural effusion or pneumothorax is seen. No acute-appearing osseous abnormality. IMPRESSION: No active disease. No evidence of pneumonia or pulmonary edema. Borderline cardiomegaly. Electronically Signed   By: Franki Cabot M.D.   On: 12/08/2020 11:31   ECHOCARDIOGRAM COMPLETE  Result Date: 12/09/2020    ECHOCARDIOGRAM REPORT   Patient Name:   Amber Peterson [facility] Date of Exam: 12/09/2020 Medical Rec #:  [id number]      Height:       59.0 in Accession #:    [id number]     Weight:       135.0 lb Date of Birth:  [birth year]      BSA:          1.561 m Patient Age:    65 years       BP:           134/84 mmHg Patient Gender: F              HR:           94 bpm. Exam Location:  Inpatient Procedure: 2D Echo, Color Doppler and Cardiac Doppler Indications:    STEMI  History:        Patient has no prior history of Echocardiogram examinations.  Sonographer:    MH Referring Phys: [place]  1. Left ventricular ejection fraction, by estimation, is 30 to 35%. The left ventricle has moderately decreased function. The left ventricle demonstrates regional wall motion abnormalities with mid to apical anterior, anteroseptal, and inferoseptal akinesis, apical inferior akinesis, apical lateral akinesis, akinesis of the true apex. There is mild left ventricular hypertrophy. Left  ventricular diastolic parameters are consistent with Grade I diastolic dysfunction (impaired relaxation).  2. Right ventricular systolic function is normal. The right ventricular size is normal.  3. Left atrial size was mildly dilated.  4. The mitral valve is normal in structure. No evidence of mitral valve regurgitation. No evidence of mitral stenosis.  5. The aortic valve is tricuspid. Aortic valve regurgitation is not visualized. No aortic stenosis is present.  6. The inferior vena cava is normal in size with greater than 50% respiratory variability, suggesting right atrial pressure of 3 mmHg.  7. A small pericardial effusion is present. FINDINGS  Left Ventricle: Left ventricular ejection fraction, by estimation, is 30 to 35%. The left ventricle has moderately decreased function. The left ventricle demonstrates regional wall motion abnormalities. The left ventricular internal cavity size was normal in size. There is mild left ventricular hypertrophy. Left ventricular diastolic parameters are consistent with Grade I diastolic dysfunction (impaired relaxation). Right Ventricle: The right ventricular size is normal.  No increase in right ventricular wall thickness. Right ventricular systolic function is normal. Left Atrium: Left atrial size was mildly dilated. Right Atrium: Right atrial size was normal in size. Pericardium: A small pericardial effusion is present. Mitral Valve: The mitral valve is normal in structure. There is mild calcification of the mitral valve leaflet(s). Mild mitral annular calcification. No evidence of mitral valve regurgitation. No evidence of mitral valve stenosis. Tricuspid Valve: The tricuspid valve is normal in structure. Tricuspid valve regurgitation is trivial. Aortic Valve: The aortic valve is tricuspid. Aortic valve regurgitation is not visualized. No aortic stenosis is present. Aortic valve mean gradient measures 5.0 mmHg. Aortic valve peak gradient measures 8.2 mmHg. Aortic valve  area, by VTI measures 1.50 cm. Pulmonic Valve: The pulmonic valve was normal in structure. Pulmonic valve regurgitation is not visualized. Aorta: The aortic root is normal in size and structure. Venous: The inferior vena cava is normal in size with greater than 50% respiratory variability, suggesting right atrial pressure of 3 mmHg. IAS/Shunts: No atrial level shunt detected by color flow Doppler.  LEFT VENTRICLE PLAX 2D LVIDd:         3.50 cm     Diastology LVIDs:         2.50 cm     LV e' medial:    8.38 cm/s LV PW:         1.40 cm     LV E/e' medial:  9.3 LV IVS:        1.50 cm     LV e' lateral:   4.24 cm/s LVOT diam:     1.50 cm     LV E/e' lateral: 18.3 LV SV:         42 LV SV Index:   27 LVOT Area:     1.77 cm  LV Volumes (MOD) LV vol d, MOD A4C: 36.5 ml LV vol s, MOD A4C: 16.5 ml LV SV MOD A4C:     36.5 ml RIGHT VENTRICLE             IVC RV S prime:     17.60 cm/s  IVC diam: 1.60 cm TAPSE (M-mode): 1.8 cm LEFT ATRIUM             Index       RIGHT ATRIUM          Index LA diam:        3.10 cm 1.99 cm/m  RA Area:     5.59 cm LA Vol (A2C):   37.0 ml 23.71 ml/m RA Volume:   5.48 ml  3.51 ml/m LA Vol (A4C):   42.8 ml 27.42 ml/m LA Biplane Vol: 40.0 ml 25.63 ml/m  AORTIC VALVE AV Area (Vmax):    1.32 cm AV Area (Vmean):   1.42 cm AV Area (VTI):     1.50 cm AV Vmax:           143.00 cm/s AV Vmean:          103.000 cm/s AV VTI:            0.276 m AV Peak Grad:      8.2 mmHg AV Mean Grad:      5.0 mmHg LVOT Vmax:         107.00 cm/s LVOT Vmean:        82.500 cm/s LVOT VTI:          0.235 m LVOT/AV VTI ratio: 0.85  AORTA Ao Root diam: 2.10 cm Ao Asc diam:  2.70 cm MITRAL VALVE               TRICUSPID VALVE MV Area (PHT): 3.67 cm    TR Peak grad:   20.1 mmHg MV E velocity: 77.60 cm/s  TR Vmax:        224.00 cm/s MV A velocity: 85.90 cm/s MV E/A ratio:  0.90        SHUNTS                            Systemic VTI:  0.24 m                            Systemic Diam: 1.50 cm Dalton McleanMD Electronically signed  by Franki Monte Signature Date/Time: 12/09/2020/1:53:40 PM    Final    Disposition   Pt is being discharged home today in good condition.  Follow-up Plans & Appointments     Follow-up Information     Almyra Deforest, [place] Follow up on 12/24/2020.   Specialties: Cardiology, Radiology Why: at 3:15pm for your follow up appt. Contact information: [address] [place] [address] [phone]         [facility] Follow up on 12/18/2020.   Specialty: Cardiology Why: Please come in for labs between the hours of 8:30-4pm. Contact information: [address] [place] [phone]               Discharge Instructions     Amb Referral to Cardiac Rehabilitation   Complete by: As directed    Diagnosis:  Coronary Stents STEMI     After initial evaluation and assessments completed: Virtual Based Care may be provided alone or in conjunction with Phase 2 Cardiac Rehab based on patient barriers.: Yes   Call MD for:  difficulty breathing, headache or visual disturbances   Complete by: As directed    Call MD for:  redness, tenderness, or signs of infection (pain, swelling, redness, odor or green/yellow discharge around incision site)   Complete by: As directed    Diet - low sodium heart healthy   Complete by: As directed    Discharge instructions   Complete by: As directed    Radial Site Care Refer to this sheet in the next few weeks. These instructions provide you with information on caring for yourself after your procedure. Your caregiver may also give you more specific instructions. Your treatment has been planned according to current medical practices, but problems sometimes occur. Call your caregiver if you have any problems or questions after your procedure. HOME CARE INSTRUCTIONS You may shower the day after the procedure. Remove the bandage (dressing) and gently wash the site with plain soap and water. Gently pat  the site dry.  Do not apply powder or lotion to the site.  Do not submerge the affected site in water for 3 to 5 days.  Inspect the site at least twice daily.  Do not flex or bend the affected arm for 24 hours.  No lifting over 5 pounds (2.3 kg) for 5 days after your procedure.  Do not drive home if you are discharged the same day of the procedure. Have someone else drive you.  You may drive 24 hours after the procedure unless otherwise instructed by your caregiver.  What to expect: Any bruising will usually fade within 1 to 2 weeks.  Blood that collects  in the tissue (hematoma) may be painful to the touch. It should usually decrease in size and tenderness within 1 to 2 weeks.  SEEK IMMEDIATE MEDICAL CARE IF: You have unusual pain at the radial site.  You have redness, warmth, swelling, or pain at the radial site.  You have drainage (other than a small amount of blood on the dressing).  You have chills.  You have a fever or persistent symptoms for more than 72 hours.  You have a fever and your symptoms suddenly get worse.  Your arm becomes pale, cool, tingly, or numb.  You have heavy bleeding from the site. Hold pressure on the site.   PLEASE DO NOT MISS ANY DOSES OF YOUR BRILINTA!!!!! Also keep a log of you blood pressures and bring back to your follow up appt. Please call the office with any questions.   Patients taking blood thinners should generally stay away from medicines like ibuprofen, Advil, Motrin, naproxen, and Aleve due to risk of stomach bleeding. You may take Tylenol as directed or talk to your primary doctor about alternatives.  PLEASE ENSURE THAT YOU DO NOT RUN OUT OF YOUR BRILINTA. This medication is very important to remain on for at least one year. IF you have issues obtaining this medication due to cost please CALL the office 3-5 business days prior to running out in order to prevent missing doses of this medication.   Please ensure you wear your lifevest at all times.  No driving until seen back in the clinic for follow up!   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 No Did it involve sudden or severe rash/hives, skin peeling, or any reaction on the inside of your mouth or nose? No Did you need to seek medical attention at a hospital or doctor's office? No When did it last happen?      65 years old If all above answers are "NO", may proceed with cephalosporin use.   Sulfa Antibiotics Hives, Itching        Medication List     STOP taking these medications    celecoxib 200 MG capsule Commonly known as: CELEBREX   nebivolol 10 MG tablet Commonly known as: BYSTOLIC       TAKE these medications    albuterol 108 (90 Base) MCG/ACT inhaler Commonly known as: VENTOLIN HFA Inhale 2 puffs into the lungs every 6 (six) hours as needed for wheezing or shortness of breath.   ALPRAZolam 0.5 MG tablet Commonly known as: XANAX Take 0.5 mg by mouth 2 (two) times daily.   aspirin 81 MG EC tablet Take 1 tablet (81 mg total) by mouth daily.   atorvastatin 80 MG tablet Commonly known as: LIPITOR Take 1 tablet (80 mg total) by mouth daily. Start taking on: December 13, 2020   budesonide-formoterol 160-4.5 MCG/ACT inhaler Commonly known as: SYMBICORT Inhale 2 puffs into the lungs 2 (two) times daily.   Entresto 24-26 MG Generic drug: sacubitril-valsartan Take 1 tablet by mouth 2 (two) times daily. Start taking on: December 13, 2020   hydroxypropyl methylcellulose / hypromellose 2.5 % ophthalmic solution Commonly known as:  ISOPTO TEARS / GONIOVISC Place 1 drop into both eyes 2 (two) times daily as needed for dry  eyes.   ibuprofen 200 MG tablet Commonly known as: ADVIL Take 800 mg by mouth 3 (three) times daily as needed for headache (pain).   levothyroxine 50 MCG tablet Commonly known as: SYNTHROID Take 50 mcg by mouth every morning.   methocarbamol 500 MG tablet Commonly known as: ROBAXIN Take 500 mg by mouth every 8 (eight) hours as needed.   metoprolol succinate 25 MG 24 hr tablet Commonly known as: TOPROL-XL Take 1 tablet (25 mg total) by mouth daily. Start taking on: December 13, 2020   multivitamin with minerals Tabs tablet Take 1 tablet by mouth daily. Balance of Nature   nitroGLYCERIN 0.4 MG SL tablet Commonly known as: Nitrostat Place 1 tablet (0.4 mg total) under the tongue every 5 (five) minutes as needed for chest pain.   spironolactone 25 MG tablet Commonly known as: ALDACTONE Take 0.5 tablets (12.5 mg total) by mouth daily. Start taking on: December 13, 2020   ticagrelor 90 MG Tabs tablet Commonly known as: BRILINTA Take 1 tablet (90 mg total) by mouth 2 (two) times daily.   traZODone 50 MG tablet Commonly known as: DESYREL Take 150 mg by mouth at bedtime.   venlafaxine XR 150 MG 24 hr capsule Commonly known as: EFFEXOR-XR Take 150 mg by mouth daily with breakfast.   VITAMIN C PO Take 1 tablet by mouth daily.   VITAMIN D3 PO Take 1 tablet by mouth daily.   ZINC PO Take 1 tablet by mouth daily.               Durable Medical Equipment  (From admission, onward)           Start     Ordered   12/11/20 1027  For home use only DME Vest life vest  Once       Comments: Length of need- 3 months   12/11/20 1027            Outstanding Labs/Studies   FLP/LFTs in 8 weeks  CBC 9/7  Echo in 3 months  Duration of Discharge Encounter   Greater than 30 minutes including physician time.  Signed, Reino Bellis, NP 12/12/2020, 1:44 PM

## 2020-12-12 NOTE — TOC Benefit Eligibility Note (Signed)
Patient Research scientist (life sciences) completed.     The patient is currently admitted and upon discharge could be taking Jardiance 10 mg.   The current 30 day co-pay is, $47.00.   The patient is currently admitted and upon discharge could be taking Farxiga 10 mg.   The current 30 day co-pay is, $47.00.  The patient is currently admitted and upon discharge could be taking Entresto 24-26 mg.   The current 30 day co-pay is, $47.00.     The patient is insured through [place] [employer], [place] Patient Advocate Specialist [place] Team Direct Number: [phone]  Fax: [phone]

## 2020-12-12 NOTE — Progress Notes (Signed)
CARDIAC REHAB PHASE I   PRE:  Rate/Rhythm: 107 ST  BP:  Supine:   Sitting: 113/69  Standing:    SaO2: 96%RA  MODE:  Ambulation: 700 ft   POST:  Rate/Rhythm: 115 ST  BP:  Supine:   Sitting: 118/76  Standing:    SaO2: 98%RA 1119-1145 Pt walked 700 ft on RA with steady gait. Tolerated well. No CP. Pt hoping to go home today. Has lifevest in room.   Graylon Good, RN BSN  12/12/2020 11:41 AM

## 2020-12-12 NOTE — Care Management Important Message (Signed)
Important Message  Patient Details  Name: Amber Peterson MRN: [id number] Date of Birth: 10/10/1955   Medicare Important Message Given:  Yes     Shelda Altes 12/12/2020, 9:16 AM

## 2020-12-18 ENCOUNTER — Other Ambulatory Visit: Payer: Self-pay

## 2020-12-18 ENCOUNTER — Telehealth: Payer: Self-pay | Admitting: Pharmacist

## 2020-12-18 DIAGNOSIS — D649 Anemia, unspecified: Secondary | ICD-10-CM | POA: Diagnosis not present

## 2020-12-18 NOTE — Telephone Encounter (Signed)
Pharmacy Transitions of Care Follow-up Telephone Call  Date of discharge: 12/12/2020  Discharge Diagnosis: Coronary Stent  How have you been since you were released from the hospital? Pt states she has been doing well and understands all her medications.     Medication Review: TICAGRELOR (BRILINTA) Ticagrelor 90 mg BID initiated on 12/12/2020.  - Discussed importance of taking medication around the same time every day, - Reviewed potential DDIs with patient - Advised patient of medications to avoid (NSAIDs, aspirin maintenance doses>100 mg daily) - Educated that Tylenol (acetaminophen) will be the preferred analgesic to prevent risk of bleeding  - Emphasized importance of monitoring for signs and symptoms of bleeding (abnormal bruising, prolonged bleeding, nose bleeds, bleeding from gums, discolored urine, black tarry stools)  - Educated patient to notify doctor if shortness of breath or abnormal heartbeat occur - Advised patient to alert all providers of antiplatelet therapy prior to starting a new medication or having a procedure   Follow-up Appointments:  Pt is getting labs done today.

## 2020-12-19 LAB — CBC
Hematocrit: 35.6 % (ref 34.0–46.6)
Hemoglobin: 12.5 g/dL (ref 11.1–15.9)
MCH: 34.3 pg — ABNORMAL HIGH (ref 26.6–33.0)
MCHC: 35.1 g/dL (ref 31.5–35.7)
MCV: 98 fL — ABNORMAL HIGH (ref 79–97)
Platelets: 222 10*3/uL (ref 150–450)
RBC: 3.64 x10E6/uL — ABNORMAL LOW (ref 3.77–5.28)
RDW: 13.5 % (ref 11.7–15.4)
WBC: 9.6 10*3/uL (ref 3.4–10.8)

## 2020-12-23 DIAGNOSIS — Z006 Encounter for examination for normal comparison and control in clinical research program: Secondary | ICD-10-CM

## 2020-12-24 ENCOUNTER — Other Ambulatory Visit: Payer: Self-pay

## 2020-12-24 ENCOUNTER — Encounter: Payer: Self-pay | Admitting: Physician Assistant

## 2020-12-24 ENCOUNTER — Telehealth: Payer: Self-pay | Admitting: *Deleted

## 2020-12-24 ENCOUNTER — Ambulatory Visit (INDEPENDENT_AMBULATORY_CARE_PROVIDER_SITE_OTHER): Payer: Medicare Other | Admitting: Physician Assistant

## 2020-12-24 VITALS — BP 104/78 | HR 74 | Resp 20 | Ht <= 58 in | Wt 132.0 lb

## 2020-12-24 DIAGNOSIS — I255 Ischemic cardiomyopathy: Secondary | ICD-10-CM | POA: Diagnosis not present

## 2020-12-24 DIAGNOSIS — E785 Hyperlipidemia, unspecified: Secondary | ICD-10-CM | POA: Diagnosis not present

## 2020-12-24 DIAGNOSIS — I2510 Atherosclerotic heart disease of native coronary artery without angina pectoris: Secondary | ICD-10-CM | POA: Diagnosis not present

## 2020-12-24 DIAGNOSIS — M2550 Pain in unspecified joint: Secondary | ICD-10-CM

## 2020-12-24 DIAGNOSIS — I10 Essential (primary) hypertension: Secondary | ICD-10-CM | POA: Diagnosis not present

## 2020-12-24 DIAGNOSIS — Z79899 Other long term (current) drug therapy: Secondary | ICD-10-CM

## 2020-12-24 MED ORDER — ATORVASTATIN CALCIUM 40 MG PO TABS: 40.0000 mg | ORAL_TABLET | Freq: Every day | ORAL | 3 refills | Status: DC

## 2020-12-24 MED ORDER — DAPAGLIFLOZIN PROPANEDIOL 10 MG PO TABS: 10.0000 mg | ORAL_TABLET | Freq: Every day | ORAL | 3 refills | Status: DC

## 2020-12-24 MED ORDER — GABAPENTIN 300 MG PO CAPS: 300.0000 mg | ORAL_CAPSULE | Freq: Three times a day (TID) | ORAL | 0 refills | Status: DC

## 2020-12-24 NOTE — Progress Notes (Signed)
Cardiology Office Note:    Date:  12/26/2020   ID:  Amber Peterson, DOB 1955/11/14, MRN [id number]  PCP:  Kathyrn Lass   [facility] Providers Cardiologist:  Glenetta Hew, MD     Referring MD: Buford Dresser,*   Chief Complaint  Patient presents with   Follow-up    Seen for Dr. Ellyn Hack    History of Present Illness:    Amber Peterson is a 65 y.o. female with a hx of hepatitis C, hypertension, hyperlipidemia, recently diagnosed CAD and ischemic cardiomyopathy.  Patient recently presented to the hospital on 12/08/2020 with chest pain, EKG revealed anterior STEMI.  [facility] trop[facility] 4575.  White blood cell count 20.  COVID test negative.  Initial cardiac catheterization performed on 11/30/2020 revealed 100% proximal to mid LAD occlusion treated with Onyx frontier 2.5 x 26 mm DES, 80% proximal to mid left circumflex lesion, 99% mid left circumflex lesion, 30% proximal RCA lesion.  Echocardiogram obtained on 12/09/2020 showed EF 30 to 35%, wall motion abnormality with mid to apical anterior, anteroseptal, and inferoseptal akinesis, apical inferior akinesis, apical lateral akinesis, akinesis of the true apex, mild LVH. She returned to the Cath Lab on 12/10/2020 and underwent DES to 99% mid left circumflex lesion.  Postprocedure, patient was placed on aspirin and Brilinta.  Prior to discharge, she was placed on LifeVest.  She has been placed on Toprol-XL, low-dose Entresto and spironolactone..  Hemoglobin dropped down to 10.7 in the hospital.  Hospital stay complicated by a total of 3 bright red blood per rectum episodes.  Since discharge, she has had repeat CBC that showed normal hemoglobin level.  She denies any further chest discomfort.  She unfortunately has worsening joint aches.  She does mention she has a history of severe osteoarthritis.  I will decrease Lipitor to 40 mg daily.  She will need a fasting lipid panel and LFT in 4 weeks, if cholesterol  still uncontrolled, we will refer her to the lipid clinic to consider PCSK9 inhibitor.  I will give her a short course of gabapentin 300 mg daily to see if it will help with her joint aches.  She has tried in the past and find it to be very helpful.  I told her to check with her PCP if she need any more refill.  Otherwise we will start her on Farxiga 10 mg daily.  Her blood pressure does not allow me to further uptitrate heart failure medication.  I will obtain repeat echocardiogram in 3 months to reassess her ejection fraction.  If EF improved to greater than 35%, then her LifeVest can come off.  Otherwise she can follow-up with Dr. Ellyn Hack after the echocardiogram in December or early January.  Past Medical History:  Diagnosis Date   Acute ST elevation myocardial infarction (STEMI) due to occlusion of distal portion of left anterior descending (LAD) coronary artery ([place]) 12/08/2020   Anxiety    Arthritis    all major joint   Asthma 07/22/2017   Coronary artery disease involving native coronary artery of native heart with unstable angina pectoris ([place]) 12/08/2020   12/08/2020: 100 % mLAD - DES PCI Onyx Frontier DES 2.5 x 26; 80%-99% bifurcation LCx-OM/AVG Cx (plan staged PCI)>   Depression    Hepatitis C 07/22/2017   History of kidney stones    Hypertension    Ischemic cardiomyopathy 12/10/2020   EF 30 to 35% with anterior, anterolateral and inferior hypokinesis to akinesis consistent with LAD infarct.  Past Surgical History:  Procedure Laterality Date   ABDOMINAL HYSTERECTOMY  1995   complete hysterectomy     CORONARY BALLOON ANGIOPLASTY N/A 12/10/2020   Procedure: CORONARY BALLOON ANGIOPLASTY;  Surgeon: Troy Sine, MD;  Location: [place] [facility];  Service: Cardiovascular;  Laterality: N/A;   CORONARY STENT INTERVENTION N/A 12/08/2020   Procedure: CORONARY STENT INTERVENTION;  Surgeon: Lorretta Harp, MD;  Location: [place] [facility];  Service: Cardiovascular;  Laterality: N/A;    CORONARY STENT INTERVENTION N/A 12/10/2020   Procedure: CORONARY STENT INTERVENTION;  Surgeon: Troy Sine, MD;  Location: [place] [facility];  Service: Cardiovascular;  Laterality: N/A;   CORONARY/GRAFT ACUTE MI REVASCULARIZATION N/A 12/08/2020   Procedure: Coronary/Graft Acute MI Revascularization;  Surgeon: Lorretta Harp, MD;  Location: [place] [facility];  Service: Cardiovascular;  Laterality: N/A;   FRACTURE SURGERY Right 01/27/2019   LEFT HEART CATH N/A 12/10/2020   Procedure: Left Heart Cath;  Surgeon: Troy Sine, MD;  Location: [place] [facility];  Service: Cardiovascular;  Laterality: N/A;   LEFT HEART CATH AND CORONARY ANGIOGRAPHY N/A 12/08/2020   Procedure: LEFT HEART CATH AND CORONARY ANGIOGRAPHY;  Surgeon: Lorretta Harp, MD;  Location: [place] [facility];  Service: Cardiovascular;  Laterality: N/A;   REVERSE SHOULDER ARTHROPLASTY Right 03/22/2019   Procedure: REVERSE SHOULDER ARTHROPLASTY;  Surgeon: Hiram Gash, MD;  Location: [facility];  Service: Orthopedics;  Laterality: Right;   tonsillectomy     TONSILLECTOMY  2000    Current Medications: Current Meds  Medication Sig   albuterol (VENTOLIN HFA) 108 (90 Base) MCG/ACT inhaler Inhale 2 puffs into the lungs every 6 (six) hours as needed for wheezing or shortness of breath.   ALPRAZolam (XANAX) 0.5 MG tablet Take 0.5 mg by mouth 2 (two) times daily.   Ascorbic Acid (VITAMIN C PO) Take 1 tablet by mouth daily.   aspirin 81 MG EC tablet Take 1 tablet (81 mg total) by mouth daily.   budesonide-formoterol (SYMBICORT) 160-4.5 MCG/ACT inhaler Inhale 2 puffs into the lungs 2 (two) times daily.   dapagliflozin propanediol (FARXIGA) 10 MG TABS tablet Take 1 tablet (10 mg total) by mouth daily before breakfast.   gabapentin (NEURONTIN) 300 MG capsule Take 1 capsule (300 mg total) by mouth 3 (three) times daily.   levothyroxine (SYNTHROID) 50 MCG tablet Take 50 mcg by mouth every morning.   metoprolol succinate (TOPROL-XL) 25 MG  24 hr tablet Take 1 tablet (25 mg total) by mouth daily.   Multiple Vitamin (MULTIVITAMIN WITH MINERALS) TABS tablet Take 1 tablet by mouth daily. Balance of Nature   nitroGLYCERIN (NITROSTAT) 0.4 MG SL tablet Place 1 tablet (0.4 mg total) under the tongue every 5 (five) minutes as needed for chest pain.   sacubitril-valsartan (ENTRESTO) 24-26 MG Take 1 tablet by mouth 2 (two) times daily.   spironolactone (ALDACTONE) 25 MG tablet Take 0.5 tablets (12.5 mg total) by mouth daily.   ticagrelor (BRILINTA) 90 MG TABS tablet Take 1 tablet (90 mg total) by mouth 2 (two) times daily.   traZODone (DESYREL) 50 MG tablet Take 150 mg by mouth at bedtime.   venlafaxine XR (EFFEXOR-XR) 150 MG 24 hr capsule Take 150 mg by mouth daily with breakfast.   [DISCONTINUED] atorvastatin (LIPITOR) 80 MG tablet Take 1 tablet (80 mg total) by mouth daily.     Allergies:   Shellfish allergy, Other, Penicillins, and Sulfa antibiotics   Social History   Socioeconomic History  Marital status: Divorced    Spouse name: Not on file   Number of children: Not on file   Years of education: Not on file   Highest education level: Not on file  Occupational History   Not on file  Tobacco Use   Smoking status: Never   Smokeless tobacco: Never  Vaping Use   Vaping Use: Never used  Substance and Sexual Activity   Alcohol use: Not Currently    Comment: rarely   Drug use: Never   Sexual activity: Not on file  Other Topics Concern   Not on file  Social History Narrative   Not on file   Social Determinants of Health   Financial Resource Strain: Not on file  Food Insecurity: Not on file  Transportation Needs: Not on file  Physical Activity: Not on file  Stress: Not on file  Social Connections: Not on file     Family History: The patient's family history is not on file.  ROS:   Please see the history of present illness.     All other systems reviewed and are negative.  EKGs/Labs/Other Studies Reviewed:     The following studies were reviewed today:  Echo 12/09/2020 1. Left ventricular ejection fraction, by estimation, is 30 to 35%. The  left ventricle has moderately decreased function. The left ventricle  demonstrates regional wall motion abnormalities with mid to apical  anterior, anteroseptal, and inferoseptal  akinesis, apical inferior akinesis, apical lateral akinesis, akinesis of  the true apex. There is mild left ventricular hypertrophy. Left  ventricular diastolic parameters are consistent with Grade I diastolic  dysfunction (impaired relaxation).   2. Right ventricular systolic function is normal. The right ventricular  size is normal.   3. Left atrial size was mildly dilated.   4. The mitral valve is normal in structure. No evidence of mitral valve  regurgitation. No evidence of mitral stenosis.   5. The aortic valve is tricuspid. Aortic valve regurgitation is not  visualized. No aortic stenosis is present.   6. The inferior vena cava is normal in size with greater than 50%  respiratory variability, suggesting right atrial pressure of 3 mmHg.   7. A small pericardial effusion is present.   EKG:  EKG is ordered today.  The ekg ordered today demonstrates rhythm with T wave inversion in anterolateral leads  Recent Labs: 12/08/2020: ALT 37 12/09/2020: TSH 0.643 12/18/2020: Hemoglobin 12.5; Platelets 222 12/24/2020: BUN 11; Creatinine, Ser 0.92; Potassium 4.4; Sodium 141  Recent Lipid Panel    Component Value Date/Time   CHOL 218 (H) 12/08/2020 1045   TRIG 135 12/08/2020 1045   HDL 56 12/08/2020 1045   CHOLHDL 3.9 12/08/2020 1045   VLDL 27 12/08/2020 1045   LDLCALC 135 (H) 12/08/2020 1045     Risk Assessment/Calculations:           Physical Exam:    VS:  BP 104/78 (BP Location: Left Arm, Patient Position: Sitting, Cuff Size: Normal)   Pulse 74   Resp 20   Ht '4\' 10"'$  (1.473 m)   Wt 132 lb (59.9 kg)   SpO2 97%   BMI 27.59 kg/m     Wt Readings from Last 3  Encounters:  12/24/20 132 lb (59.9 kg)  12/10/20 133 lb 9.6 oz (60.6 kg)  03/22/19 131 lb 14.4 oz (59.8 kg)     GEN:  Well nourished, well developed in no acute distress HEENT: Normal NECK: No JVD; No carotid bruits LYMPHATICS: No lymphadenopathy CARDIAC: RRR,  no murmurs, rubs, gallops RESPIRATORY:  Clear to auscultation without rales, wheezing or rhonchi  ABDOMEN: Soft, non-tender, non-distended MUSCULOSKELETAL:  No edema; No deformity  SKIN: Warm and dry NEUROLOGIC:  Alert and oriented x 3 PSYCHIATRIC:  Normal affect   ASSESSMENT:    1. Coronary artery disease involving native coronary artery of native heart without angina pectoris   2. Medication management   3. Hyperlipidemia, unspecified hyperlipidemia type   4. Ischemic cardiomyopathy   5. Primary hypertension   6. Arthralgia, unspecified joint    PLAN:    In order of problems listed above:  CAD: Recently underwent DES to LAD and left circumflex artery.  Denies any further chest discomfort.  Continue aspirin and Brilinta.  Emphasis has been placed on compliance with dual antiplatelet therapy.  Hyperlipidemia: She is on Lipitor 80 mg daily, however this has increased her joint pain quite significantly.  She is unable to perform daily activity.  I decided to decrease her Lipitor to 40 mg daily.  A repeat fasting lipid panel in 4 weeks, if cholesterol still uncontrolled, I think she will be a good candidate for PCSK9 inhibitor or inclisiran.  I think it is quite reasonable for her to be enrolled in included in Inclisiran trial.  Ischemic cardiomyopathy: Currently wearing LifeVest due to LV dysfunction.  Continue Entresto, spironolactone and metoprolol.  Start [place].  Repeat echocardiogram in 3 months, if EF is still low, will refer to EP service to consider ICD.  If EF improved to greater than 35%, LifeVest can be discontinued at that time  Hypertension: Blood pressure stable  Joint aches: She has a prior history of joint  aches, however this has significantly worsened since starting on 80 mg daily of Lipitor.  She is unable to perform daily activity, I decided to decrease Lipitor to 40 mg daily.  I gave her a prescription for gabapentin as she mentions it has helped her previously with the same pain.  I wonder if she has underlying neuropathic pain as well.        Medication Adjustments/Labs and Tests Ordered: Current medicines are reviewed at length with the patient today.  Concerns regarding medicines are outlined above.  Orders Placed This Encounter  Procedures   Basic metabolic panel   Lipid panel   Hepatic function panel   EKG 12-Lead   ECHOCARDIOGRAM COMPLETE   Meds ordered this encounter  Medications   dapagliflozin propanediol (FARXIGA) 10 MG TABS tablet    Sig: Take 1 tablet (10 mg total) by mouth daily before breakfast.    Dispense:  90 tablet    Refill:  3   gabapentin (NEURONTIN) 300 MG capsule    Sig: Take 1 capsule (300 mg total) by mouth 3 (three) times daily.    Dispense:  60 capsule    Refill:  0    Send all future refill request to PCP   atorvastatin (LIPITOR) 40 MG tablet    Sig: Take 1 tablet (40 mg total) by mouth daily.    Dispense:  90 tablet    Refill:  3    Dose change new Rx    Patient Instructions  Medication Instructions:  DECREASE Lipitor to 40 mg daily START Farxiga 10 mg daily START Gabapentin 300 mg daily  *If you need a refill on your cardiac medications before your next appointment, please call your pharmacy*  Lab Work: Your physician recommends that you return for lab work TODAY: BMET  Your physician recommends that you return for  lab work in 4 weeks (01/21/21):  Fasting Lipid Panel-DO NOT EAT OR DRINK PAST MIDNIGHT. OKAY TO HAVE WATER. Hepatic (Liver) Function   If you have labs (blood work) drawn today and your tests are completely normal, you will receive your results only by: MyChart Message (if you have MyChart) OR A paper copy in the mail If  you have any lab test that is abnormal or we need to change your treatment, we will call you to review the results.  Testing/Procedures: Your physician has requested that you have an echocardiogram. Echocardiography is a painless test that uses sound waves to create images of your heart. It provides your doctor with information about the size and shape of your heart and how well your heart's chambers and valves are working. This procedure takes approximately one hour. There are no restrictions for this procedure.  Please scheduled for middle of December 2022 at [facility] office   Follow-Up: At [facility], you and your health needs are our priority.  As part of our continuing mission to provide you with exceptional heart care, we have created designated Provider Care Teams.  These Care Teams include your primary Cardiologist (physician) and Advanced Practice Providers (APPs -  Physician Assistants and Nurse Practitioners) who all work together to provide you with the care you need, when you need it.  We recommend signing up for the patient portal called "MyChart".  Sign up information is provided on this After Visit Summary.  MyChart is used to connect with patients for Virtual Visits (Telemedicine).  Patients are able to view lab/test results, encounter notes, upcoming appointments, etc.  Non-urgent messages can be sent to your provider as well.   To learn more about what you can do with MyChart, go to [email or web address].    Your next appointment:   Follow up after Echocardiogram   The format for your next appointment:   In Person  Provider:   Glenetta Hew, MD  Other Instructions    Signed, Almyra Deforest, [place]  12/26/2020 11:33 PM    [place]

## 2020-12-24 NOTE — Patient Instructions (Addendum)
Medication Instructions:  DECREASE Lipitor to 40 mg daily START Farxiga 10 mg daily START Gabapentin 300 mg daily  *If you need a refill on your cardiac medications before your next appointment, please call your pharmacy*  Lab Work: Your physician recommends that you return for lab work TODAY: BMET  Your physician recommends that you return for lab work in 4 weeks (01/21/21):  Fasting Lipid Panel-DO NOT EAT OR DRINK PAST MIDNIGHT. OKAY TO HAVE WATER. Hepatic (Liver) Function   If you have labs (blood work) drawn today and your tests are completely normal, you will receive your results only by: MyChart Message (if you have MyChart) OR A paper copy in the mail If you have any lab test that is abnormal or we need to change your treatment, we will call you to review the results.  Testing/Procedures: Your physician has requested that you have an echocardiogram. Echocardiography is a painless test that uses sound waves to create images of your heart. It provides your doctor with information about the size and shape of your heart and how well your heart's chambers and valves are working. This procedure takes approximately one hour. There are no restrictions for this procedure.  Please scheduled for middle of December 2022 at [facility] office   Follow-Up: At [facility], you and your health needs are our priority.  As part of our continuing mission to provide you with exceptional heart care, we have created designated Provider Care Teams.  These Care Teams include your primary Cardiologist (physician) and Advanced Practice Providers (APPs -  Physician Assistants and Nurse Practitioners) who all work together to provide you with the care you need, when you need it.  We recommend signing up for the patient portal called "MyChart".  Sign up information is provided on this After Visit Summary.  MyChart is used to connect with patients for Virtual Visits (Telemedicine).  Patients are able to view  lab/test results, encounter notes, upcoming appointments, etc.  Non-urgent messages can be sent to your provider as well.   To learn more about what you can do with MyChart, go to [email or web address].    Your next appointment:   Follow up after Echocardiogram   The format for your next appointment:   In Person  Provider:   Glenetta Hew, MD  Other Instructions

## 2020-12-24 NOTE — Telephone Encounter (Signed)
Spoke with patient about participating in [place] V-Inception.  Patient was emailed a copy of consent.  Will Follow-up with the patient Early Next week.

## 2020-12-25 LAB — BASIC METABOLIC PANEL
BUN/Creatinine Ratio: 12 (ref 12–28)
BUN: 11 mg/dL (ref 8–27)
CO2: 24 mmol/L (ref 20–29)
Calcium: 9.2 mg/dL (ref 8.7–10.3)
Chloride: 103 mmol/L (ref 96–106)
Creatinine, Ser: 0.92 mg/dL (ref 0.57–1.00)
Glucose: 91 mg/dL (ref 65–99)
Potassium: 4.4 mmol/L (ref 3.5–5.2)
Sodium: 141 mmol/L (ref 134–144)
eGFR: 69 mL/min/{1.73_m2} (ref >59)

## 2020-12-25 NOTE — Progress Notes (Signed)
Normal renal function and electrolyte

## 2020-12-26 ENCOUNTER — Encounter: Payer: Self-pay | Admitting: Physician Assistant

## 2020-12-30 NOTE — Research (Signed)
Spoke with patient via telephone about Lp(a) trial. She asked to be called back later after she discusses it with her cardiologist.

## 2021-01-08 ENCOUNTER — Telehealth: Payer: Self-pay | Admitting: Cardiology

## 2021-01-08 MED ORDER — ENTRESTO 24-26 MG PO TABS: 1.0000 | ORAL_TABLET | Freq: Two times a day (BID) | ORAL | 5 refills | Status: DC

## 2021-01-08 MED ORDER — METOPROLOL SUCCINATE ER 25 MG PO TB24: 25.0000 mg | ORAL_TABLET | Freq: Every day | ORAL | 5 refills | Status: DC

## 2021-01-08 MED ORDER — TICAGRELOR 90 MG PO TABS: 90.0000 mg | ORAL_TABLET | Freq: Two times a day (BID) | ORAL | 5 refills | Status: DC

## 2021-01-08 NOTE — Telephone Encounter (Signed)
*  STAT* If patient is at the pharmacy, call can be transferred to refill team.   1. Which medications need to be refilled? (please list name of each medication and dose if known) brilliant 90 mg Entresto and metoprolol   2. Which pharmacy/location (including street and city if local pharmacy) is medication to be sent to? [facility] [address]  3. Do they need a 30 day or 90 day supply? Not sure she thinks it is 30 days   Patient will be out by Sunday

## 2021-01-08 NOTE — Telephone Encounter (Signed)
Refills has been sent to pharmacy. 

## 2021-01-11 DIAGNOSIS — I2102 ST elevation (STEMI) myocardial infarction involving left anterior descending coronary artery: Secondary | ICD-10-CM | POA: Diagnosis not present

## 2021-01-15 DIAGNOSIS — F32A Depression, unspecified: Secondary | ICD-10-CM | POA: Diagnosis not present

## 2021-01-15 DIAGNOSIS — I255 Ischemic cardiomyopathy: Secondary | ICD-10-CM | POA: Diagnosis not present

## 2021-01-15 DIAGNOSIS — K219 Gastro-esophageal reflux disease without esophagitis: Secondary | ICD-10-CM | POA: Diagnosis not present

## 2021-01-15 DIAGNOSIS — D696 Thrombocytopenia, unspecified: Secondary | ICD-10-CM | POA: Diagnosis not present

## 2021-01-15 DIAGNOSIS — E782 Mixed hyperlipidemia: Secondary | ICD-10-CM | POA: Diagnosis not present

## 2021-01-15 DIAGNOSIS — D751 Secondary polycythemia: Secondary | ICD-10-CM | POA: Diagnosis not present

## 2021-01-15 DIAGNOSIS — E0590 Thyrotoxicosis, unspecified without thyrotoxic crisis or storm: Secondary | ICD-10-CM | POA: Diagnosis not present

## 2021-01-15 DIAGNOSIS — R7303 Prediabetes: Secondary | ICD-10-CM | POA: Diagnosis not present

## 2021-01-15 DIAGNOSIS — G894 Chronic pain syndrome: Secondary | ICD-10-CM | POA: Diagnosis not present

## 2021-01-15 DIAGNOSIS — N1831 Chronic kidney disease, stage 3a: Secondary | ICD-10-CM | POA: Diagnosis not present

## 2021-01-15 DIAGNOSIS — I2510 Atherosclerotic heart disease of native coronary artery without angina pectoris: Secondary | ICD-10-CM | POA: Diagnosis not present

## 2021-01-21 ENCOUNTER — Other Ambulatory Visit: Payer: Self-pay

## 2021-01-21 DIAGNOSIS — E785 Hyperlipidemia, unspecified: Secondary | ICD-10-CM | POA: Diagnosis not present

## 2021-01-21 LAB — HEPATIC FUNCTION PANEL
ALT: 30 IU/L (ref 0–32)
AST: 27 IU/L (ref 0–40)
Albumin: 4.5 g/dL (ref 3.8–4.8)
Alkaline Phosphatase: 73 IU/L (ref 44–121)
Bilirubin Total: 1.3 mg/dL — ABNORMAL HIGH (ref 0.0–1.2)
Bilirubin, Direct: 0.37 mg/dL (ref 0.00–0.40)
Total Protein: 7.5 g/dL (ref 6.0–8.5)

## 2021-01-21 LAB — LIPID PANEL
Chol/HDL Ratio: 2.2 ratio (ref 0.0–4.4)
Cholesterol, Total: 118 mg/dL (ref 100–199)
HDL: 53 mg/dL (ref >39)
LDL Chol Calc (NIH): 45 mg/dL (ref 0–99)
Triglycerides: 111 mg/dL (ref 0–149)
VLDL Cholesterol Cal: 20 mg/dL (ref 5–40)

## 2021-01-22 ENCOUNTER — Telehealth: Payer: Self-pay | Admitting: Cardiology

## 2021-01-22 NOTE — Telephone Encounter (Signed)
Patient would like a list of her medications to be faxed to [facility], fax # [phone].   Patient wants to know if it okay for her to get the injections or should she hold off.

## 2021-01-22 NOTE — Telephone Encounter (Signed)
Returned call to pt informed pt that provider needs to fax cardiac clearance to our office. Gave pt what needs to be included in their fax. Verbalized understanding she will let them know

## 2021-01-26 HISTORY — PX: ANKLE FRACTURE SURGERY: SHX122

## 2021-02-11 ENCOUNTER — Telehealth: Payer: Self-pay | Admitting: Cardiology

## 2021-02-11 DIAGNOSIS — I2102 ST elevation (STEMI) myocardial infarction involving left anterior descending coronary artery: Secondary | ICD-10-CM | POA: Diagnosis not present

## 2021-02-11 MED ORDER — SPIRONOLACTONE 25 MG PO TABS: 12.5000 mg | ORAL_TABLET | Freq: Every day | ORAL | 11 refills | Status: DC

## 2021-02-11 NOTE — Telephone Encounter (Signed)
*  STAT* If patient is at the pharmacy, call can be transferred to refill team.   1. Which medications need to be refilled? (please list name of each medication and dose if known) spironolactone (ALDACTONE) 25 MG tablet  2. Which pharmacy/location (including street and city if local pharmacy) is medication to be sent to? [id number] [place] [place] [address]  3. Do they need a 30 day or 90 day supply? 90 day

## 2021-02-19 ENCOUNTER — Encounter (HOSPITAL_COMMUNITY): Payer: Medicare Other

## 2021-02-26 ENCOUNTER — Telehealth: Payer: Self-pay | Admitting: Cardiology

## 2021-02-26 NOTE — Telephone Encounter (Signed)
Returned the call to the patient. She stated that she has not worn her Life Vest for the past two weeks due to skin breakdown. She stated that she sweats every time she puts in on and it is breaking down her skin around her shoulders in the back. She called [employer] and stated that they were not able to give her any suggestions but were aware that she was no longer wearing the vest. Her last transmission was on 11/3.  She denies any chest pain and shortness of breath.   She has an echo on 03/24/21 Follow up appointment with Dr. Ellyn Hack on 04/22/21

## 2021-02-26 NOTE — Telephone Encounter (Signed)
Calling to say that the heart monitor vest is irritating her. So she is unable to wear the heart monitor, asking what the dr wants her to do. Please advises

## 2021-02-26 NOTE — Telephone Encounter (Signed)
Will fwd as this is a [facility] patient.

## 2021-02-27 ENCOUNTER — Encounter (HOSPITAL_COMMUNITY)
Admission: RE | Admit: 2021-02-27 | Discharge: 2021-02-27 | Disposition: A | Discharge status: Home / Self Care | Payer: Medicare Other | Source: Ambulatory Visit | Attending: Cardiovascular Disease | Admitting: Cardiovascular Disease

## 2021-02-27 ENCOUNTER — Encounter (HOSPITAL_COMMUNITY): Payer: Self-pay

## 2021-02-27 VITALS — BP 98/60 | HR 66 | Ht <= 58 in | Wt 138.4 lb

## 2021-02-27 DIAGNOSIS — Z955 Presence of coronary angioplasty implant and graft: Secondary | ICD-10-CM | POA: Diagnosis not present

## 2021-02-27 DIAGNOSIS — I213 ST elevation (STEMI) myocardial infarction of unspecified site: Secondary | ICD-10-CM | POA: Diagnosis not present

## 2021-02-27 NOTE — Progress Notes (Signed)
Cardiac Individual Treatment Plan  Patient Details  Name: Amber Peterson MRN: [id number] Date of Birth: 03/02/1956 Referring Provider:   Flowsheet Row CARDIAC REHAB PHASE II ORIENTATION from 02/27/2021 in [place]  Referring Provider Dr. Ellyn Hack       Initial Encounter Date:  Flowsheet Row CARDIAC REHAB PHASE II ORIENTATION from 02/27/2021 in [place]  Date 02/27/21       Visit Diagnosis: ST elevation myocardial infarction (STEMI), unspecified artery ([place])  S/P coronary artery stent placement  Patient's Home Medications on Admission:  Current Outpatient Medications:    acetaminophen (TYLENOL) 500 MG tablet, Take 2,000 mg by mouth 4 (four) times daily as needed for moderate pain., Disp: , Rfl:    albuterol (VENTOLIN HFA) 108 (90 Base) MCG/ACT inhaler, Inhale 2 puffs into the lungs every 6 (six) hours as needed for wheezing or shortness of breath., Disp: , Rfl:    ALPRAZolam (XANAX) 0.5 MG tablet, Take 0.5 mg by mouth 2 (two) times daily., Disp: , Rfl:    aspirin 81 MG EC tablet, Take 1 tablet (81 mg total) by mouth daily., Disp: 90 tablet, Rfl: 3   atorvastatin (LIPITOR) 40 MG tablet, Take 1 tablet (40 mg total) by mouth daily., Disp: 90 tablet, Rfl: 3   Biotin 1000 MCG tablet, Take 1,000 mcg by mouth daily., Disp: , Rfl:    budesonide-formoterol (SYMBICORT) 160-4.5 MCG/ACT inhaler, Inhale 2 puffs into the lungs 2 (two) times daily., Disp: , Rfl:    Cholecalciferol (DIALYVITE VITAMIN D 5000 PO), Take 5,000 Units by mouth daily., Disp: , Rfl:    gabapentin (NEURONTIN) 300 MG capsule, Take 1 capsule (300 mg total) by mouth 3 (three) times daily. (Patient taking differently: Take 300 mg by mouth 3 (three) times daily as needed (pain).), Disp: 60 capsule, Rfl: 0   hydrocortisone cream 1 %, Apply 1 application topically daily as needed for itching., Disp: , Rfl:    metoprolol succinate (TOPROL-XL) 25 MG 24 hr tablet, Take 1 tablet (25 mg  total) by mouth daily., Disp: 30 tablet, Rfl: 5   nitroGLYCERIN (NITROSTAT) 0.4 MG SL tablet, Place 1 tablet (0.4 mg total) under the tongue every 5 (five) minutes as needed for chest pain., Disp: 25 tablet, Rfl: 1   sacubitril-valsartan (ENTRESTO) 24-26 MG, Take 1 tablet by mouth 2 (two) times daily., Disp: 60 tablet, Rfl: 5   spironolactone (ALDACTONE) 25 MG tablet, Take 0.5 tablets (12.5 mg total) by mouth daily., Disp: 30 tablet, Rfl: 11   Tetrahydrozoline HCl (VISINE OP), Place 1 drop into both eyes daily as needed (dry eyes)., Disp: , Rfl:    thyroid (ARMOUR) 15 MG tablet, Take 15 mg by mouth daily., Disp: , Rfl:    ticagrelor (BRILINTA) 90 MG TABS tablet, Take 1 tablet (90 mg total) by mouth 2 (two) times daily., Disp: 60 tablet, Rfl: 5   traZODone (DESYREL) 50 MG tablet, Take 150 mg by mouth at bedtime., Disp: , Rfl:    venlafaxine XR (EFFEXOR-XR) 150 MG 24 hr capsule, Take 150 mg by mouth daily with breakfast., Disp: , Rfl:    vitamin B-12 (CYANOCOBALAMIN) 1000 MCG tablet, Take 1,000 mcg by mouth daily., Disp: , Rfl:    zinc gluconate 50 MG tablet, Take 50 mg by mouth daily., Disp: , Rfl:    Ascorbic Acid (VITAMIN C PO), Take 1 tablet by mouth daily., Disp: , Rfl:    dapagliflozin propanediol (FARXIGA) 10 MG TABS tablet, Take 1 tablet (10 mg total)  by mouth daily before breakfast. (Patient not taking: No sig reported), Disp: 90 tablet, Rfl: 3  Past Medical History: Past Medical History:  Diagnosis Date   Acute ST elevation myocardial infarction (STEMI) due to occlusion of distal portion of left anterior descending (LAD) coronary artery ([place]) 12/08/2020   Anxiety    Arthritis    all major joint   Asthma 07/22/2017   Coronary artery disease involving native coronary artery of native heart with unstable angina pectoris ([place]) 12/08/2020   12/08/2020: 100 % mLAD - DES PCI Onyx Frontier DES 2.5 x 26; 80%-99% bifurcation LCx-OM/AVG Cx (plan staged PCI)>   Depression    Hepatitis C 07/22/2017    History of kidney stones    Hypertension    Ischemic cardiomyopathy 12/10/2020   EF 30 to 35% with anterior, anterolateral and inferior hypokinesis to akinesis consistent with LAD infarct.    Tobacco Use: Social History   Tobacco Use  Smoking Status Never  Smokeless Tobacco Never    Labs: Recent Review Flowsheet Data     Labs for ITP Cardiac and Pulmonary Rehab Latest Ref Rng & Units 12/08/2020 01/21/2021   Cholestrol 100 - 199 mg/dL 218(H) 118   LDLCALC 0 - 99 mg/dL 135(H) 45   HDL >39 mg/dL 56 53   Trlycerides 0 - 149 mg/dL 135 111   Hemoglobin A1c 4.8 - 5.6 % 5.6 -       Capillary Blood Glucose: No results found for: GLUCAP   Exercise Target Goals: Exercise Program Goal: Individual exercise prescription set using results from initial 6 min walk test and THRR while considering  patient's activity barriers and safety.   Exercise Prescription Goal: Starting with aerobic activity 30 plus minutes a day, 3 days per week for initial exercise prescription. Provide home exercise prescription and guidelines that participant acknowledges understanding prior to discharge.  Activity Barriers & Risk Stratification:  Activity Barriers & Cardiac Risk Stratification - 02/27/21 0830       Activity Barriers & Cardiac Risk Stratification   Activity Barriers Arthritis;Neck/Spine Problems;Joint Problems;Muscular Weakness;History of Falls    Cardiac Risk Stratification High             6 Minute Walk:  6 Minute Walk     Row Name 02/27/21 1049         6 Minute Walk   Phase Initial     Distance 1200 feet     Walk Time 6 minutes     # of Rest Breaks 0     MPH 2.3     METS 2.58     RPE 11     VO2 Peak 8.83     Symptoms No     Resting HR 83 bpm     Resting BP 98/60     Resting Oxygen Saturation  98 %     Exercise Oxygen Saturation  during 6 min walk 99 %     Max Ex. HR 94 bpm     Max Ex. BP 115/65     2 Minute Post BP 96/60              Oxygen Initial  Assessment:   Oxygen Re-Evaluation:   Oxygen Discharge (Final Oxygen Re-Evaluation):   Initial Exercise Prescription:  Initial Exercise Prescription - 02/27/21 1000       Date of Initial Exercise RX and Referring Provider   Date 02/27/21    Referring Provider Dr. Ellyn Hack    Expected Discharge Date 05/23/21  Treadmill   MPH 1.5    Grade 0    Minutes 17      NuStep   Level 1    SPM 60    Minutes 22      Prescription Details   Frequency (times per week) 3    Duration Progress to 30 minutes of continuous aerobic without signs/symptoms of physical distress      Intensity   THRR 40-80% of Max Heartrate 62-123    Ratings of Perceived Exertion 11-13    Perceived Dyspnea 0-4      Resistance Training   Training Prescription Yes    Weight 3    Reps 10-15             Perform Capillary Blood Glucose checks as needed.  Exercise Prescription Changes:   Exercise Comments:   Exercise Goals and Review:   Exercise Goals     Row Name 02/27/21 1052             Exercise Goals   Increase Physical Activity Yes       Intervention Provide advice, education, support and counseling about physical activity/exercise needs.;Develop an individualized exercise prescription for aerobic and resistive training based on initial evaluation findings, risk stratification, comorbidities and participant's personal goals.       Expected Outcomes Short Term: Attend rehab on a regular basis to increase amount of physical activity.;Long Term: Add in home exercise to make exercise part of routine and to increase amount of physical activity.;Long Term: Exercising regularly at least 3-5 days a week.       Increase Strength and Stamina Yes       Intervention Provide advice, education, support and counseling about physical activity/exercise needs.;Develop an individualized exercise prescription for aerobic and resistive training based on initial evaluation findings, risk stratification,  comorbidities and participant's personal goals.       Expected Outcomes Short Term: Increase workloads from initial exercise prescription for resistance, speed, and METs.;Short Term: Perform resistance training exercises routinely during rehab and add in resistance training at home;Long Term: Improve cardiorespiratory fitness, muscular endurance and strength as measured by increased METs and functional capacity (6MWT)       Able to understand and use rate of perceived exertion (RPE) scale Yes       Intervention Provide education and explanation on how to use RPE scale       Expected Outcomes Short Term: Able to use RPE daily in rehab to express subjective intensity level;Long Term:  Able to use RPE to guide intensity level when exercising independently       Knowledge and understanding of Target Heart Rate Range (THRR) Yes       Intervention Provide education and explanation of THRR including how the numbers were predicted and where they are located for reference       Expected Outcomes Short Term: Able to state/look up THRR;Short Term: Able to use daily as guideline for intensity in rehab;Long Term: Able to use THRR to govern intensity when exercising independently       Able to check pulse independently Yes       Intervention Provide education and demonstration on how to check pulse in carotid and radial arteries.;Review the importance of being able to check your own pulse for safety during independent exercise       Expected Outcomes Short Term: Able to explain why pulse checking is important during independent exercise;Long Term: Able to check pulse independently and accurately  Understanding of Exercise Prescription Yes       Intervention Provide education, explanation, and written materials on patient's individual exercise prescription       Expected Outcomes Short Term: Able to explain program exercise prescription;Long Term: Able to explain home exercise prescription to exercise  independently                Exercise Goals Re-Evaluation :    Discharge Exercise Prescription (Final Exercise Prescription Changes):   Nutrition:  Target Goals: Understanding of nutrition guidelines, daily intake of sodium 1500mg , cholesterol 200mg , calories 30% from fat and 7% or less from saturated fats, daily to have 5 or more servings of fruits and vegetables.  Biometrics:  Pre Biometrics - 02/27/21 1052       Pre Biometrics   Height 4\' 10"  (1.473 m)    Weight 62.8 kg    Waist Circumference 35.6 inches    Hip Circumference 38 inches    Waist to Hip Ratio 0.94 %    BMI (Calculated) 28.94    Triceps Skinfold 13 mm    % Body Fat 36.8 %    Grip Strength 20.6 kg    Flexibility 13 in    Single Leg Stand 60 seconds              Nutrition Therapy Plan and Nutrition Goals:  Nutrition Therapy & Goals - 02/27/21 0910       Personal Nutrition Goals   Comments Patient scored 13 on her diet assessment. Handout discussed and provided regarding healthier choices. We offer 2 educational sessions on heart healthy nutrition with handouts. She says she follows a low sodium diet no more that 2000 mg/day and does not eat any red meat.      Intervention Plan   Intervention Nutrition handout(s) given to patient.    Expected Outcomes Short Term Goal: Understand basic principles of dietary content, such as calories, fat, sodium, cholesterol and nutrients.             Nutrition Assessments:  Nutrition Assessments - 02/27/21 0910       MEDFICTS Scores   Pre Score 13            MEDIFICTS Score Key: ?70 Need to make dietary changes  40-70 Heart Healthy Diet ? 40 Therapeutic Level Cholesterol Diet   Picture Your Plate Scores: <30 Unhealthy dietary pattern with much room for improvement. 41-50 Dietary pattern unlikely to meet recommendations for good health and room for improvement. 51-60 More healthful dietary pattern, with some room for improvement.  >60  Healthy dietary pattern, although there may be some specific behaviors that could be improved.    Nutrition Goals Re-Evaluation:   Nutrition Goals Discharge (Final Nutrition Goals Re-Evaluation):   Psychosocial: Target Goals: Acknowledge presence or absence of significant depression and/or stress, maximize coping skills, provide positive support system. Participant is able to verbalize types and ability to use techniques and skills needed for reducing stress and depression.  Initial Review & Psychosocial Screening:  Initial Psych Review & Screening - 02/27/21 0916       Initial Review   Current issues with Current Depression;History of Depression;Current Anxiety/Panic      Family Dynamics   Good Support System? Yes      Barriers   Psychosocial barriers to participate in program The patient should benefit from training in stress management and relaxation.;There are no identifiable barriers or psychosocial needs.      Screening Interventions   Interventions Encouraged to exercise;Provide feedback  about the scores to participant;To provide support and resources with identified psychosocial needs    Expected Outcomes Short Term goal: Utilizing psychosocial counselor, staff and physician to assist with identification of specific Stressors or current issues interfering with healing process. Setting desired goal for each stressor or current issue identified.;Short Term goal: Identification and review with participant of any Quality of Life or Depression concerns found by scoring the questionnaire.;Long Term goal: The participant improves quality of Life and PHQ9 Scores as seen by post scores and/or verbalization of changes             Quality of Life Scores:  Quality of Life - 02/27/21 1053       Quality of Life   Select Quality of Life      Quality of Life Scores   Health/Function Pre 24.81 %    Socioeconomic Pre 24.21 %    Psych/Spiritual Pre 25.5 %    Family Pre 27.6 %     GLOBAL Pre 25.27 %            Scores of 19 and below usually indicate a poorer quality of life in these areas.  A difference of  2-3 points is a clinically meaningful difference.  A difference of 2-3 points in the total score of the Quality of Life Index has been associated with significant improvement in overall quality of life, self-image, physical symptoms, and general health in studies assessing change in quality of life.  PHQ-9: Recent Review Flowsheet Data     Depression screen [facility] 2/9 02/27/2021   Decreased Interest 1   Down, Depressed, Hopeless 1   PHQ - 2 Score 2   Altered sleeping 0   Tired, decreased energy 1   Change in appetite 2   Feeling bad or failure about yourself  1   Trouble concentrating 2   Moving slowly or fidgety/restless 0   Suicidal thoughts 0   PHQ-9 Score 8   Difficult doing work/chores Not difficult at all      Interpretation of Total Score  Total Score Depression Severity:  1-4 = Minimal depression, 5-9 = Mild depression, 10-14 = Moderate depression, 15-19 = Moderately severe depression, 20-27 = Severe depression   Psychosocial Evaluation and Intervention:  Psychosocial Evaluation - 02/27/21 0916       Psychosocial Evaluation & Interventions   Interventions Stress management education;Relaxation education;Encouraged to exercise with the program and follow exercise prescription    Comments Patient has no psychosocial barriers identified to participate in CR. Her initial PHQ-9 score was 8. 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 She feels her anxiety and depression are managed well with medication. She has been on treatment longterm. She names her 2 sisters as her emotional support people. She says they are very close. She has one daugther that she is also very close with who just recently got married. She has a person she names as her signficiant other. She says they have been  together for 18 years but she primarily lives alone. She demonstrates a strong interest in doing the program and improving her health overall. 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 Her cardiologist wanted her to wear the vest until repeat ECHO 03/24/21 and would discontinue if EF if at least 35%. She has not worn the vest since 11/3 due to skin breakdown on her back. She was told we  would need clearence from her cardiologist before we proceed with any exercise without the vest. She agreed to wear the vest while in cardiac rehab. She is very anxious to start the program hoping this will imrpove her health overall and get herself and her heart stronger. She also wants to learn what she can and can't do and how hard she can push herself.    Expected Outcomes Patient will continue to have no psychosocial barriers identified.    Continue Psychosocial Services  No Follow up required             Psychosocial Re-Evaluation:   Psychosocial Discharge (Final Psychosocial Re-Evaluation):   Vocational Rehabilitation: Provide vocational rehab assistance to qualifying candidates.   Vocational Rehab Evaluation & Intervention:  Vocational Rehab - 02/27/21 0911       Initial Vocational Rehab Evaluation & Intervention   Assessment shows need for Vocational Rehabilitation No      Vocational Rehab Re-Evaulation   Comments Patient is retired and does not need vocational rehab.             Education: Education Goals: Education classes will be provided on a weekly basis, covering required topics. Participant will state understanding/return demonstration of topics presented.  Learning Barriers/Preferences:  Learning Barriers/Preferences - 02/27/21 0911       Learning Barriers/Preferences   Learning Barriers None    Learning Preferences Verbal Instruction;Audio;Written Material;Skilled Demonstration             Education Topics: Hypertension,  Hypertension Reduction -Define heart disease and high blood pressure. Discus how high blood pressure affects the body and ways to reduce high blood pressure.   Exercise and Your Heart -Discuss why it is important to exercise, the FITT principles of exercise, normal and abnormal responses to exercise, and how to exercise safely.   Angina -Discuss definition of angina, causes of angina, treatment of angina, and how to decrease risk of having angina.   Cardiac Medications -Review what the following cardiac medications are used for, how they affect the body, and side effects that may occur when taking the medications.  Medications include Aspirin, Beta blockers, calcium channel blockers, ACE Inhibitors, angiotensin receptor blockers, diuretics, digoxin, and antihyperlipidemics.   Congestive Heart Failure -Discuss the definition of CHF, how to live with CHF, the signs and symptoms of CHF, and how keep track of weight and sodium intake.   Heart Disease and Intimacy -Discus the effect sexual activity has on the heart, how changes occur during intimacy as we age, and safety during sexual activity.   Smoking Cessation / COPD -Discuss different methods to quit smoking, the health benefits of quitting smoking, and the definition of COPD.   Nutrition I: Fats -Discuss the types of cholesterol, what cholesterol does to the heart, and how cholesterol levels can be controlled.   Nutrition II: Labels -Discuss the different components of food labels and how to read food label   Heart Parts/Heart Disease and PAD -Discuss the anatomy of the heart, the pathway of blood circulation through the heart, and these are affected by heart disease.   Stress I: Signs and Symptoms -Discuss the causes of stress, how stress may lead to anxiety and depression, and ways to limit stress.   Stress II: Relaxation -Discuss different types of relaxation techniques to limit stress.   Warning Signs of Stroke /  TIA -Discuss definition of a stroke, what the signs and symptoms are of a stroke, and how to identify when someone is having stroke.  Knowledge Questionnaire Score:  Knowledge Questionnaire Score - 02/27/21 1011       Knowledge Questionnaire Score   Pre Score 24/28             Core Components/Risk Factors/Patient Goals at Admission:  Personal Goals and Risk Factors at Admission - 02/27/21 0912       Core Components/Risk Factors/Patient Goals on Admission    Weight Management Weight Maintenance    Lipids Yes    Intervention Provide education and support for participant on nutrition & aerobic/resistive exercise along with prescribed medications to achieve LDL 70mg , HDL >40mg .    Expected Outcomes Short Term: Participant states understanding of desired cholesterol values and is compliant with medications prescribed. Participant is following exercise prescription and nutrition guidelines.;Long Term: Cholesterol controlled with medications as prescribed, with individualized exercise RX and with personalized nutrition plan. Value goals: LDL < 70mg , HDL > 40 mg.    Personal Goal Other Yes    Personal Goal Patient wants to get both herself stronger and her heart stronger. She want to learn what she can do and how far to push herself. She want to get healthier overall.    Intervention Patient will attend CR 3 days/week and supplement with exercise 2 days/week.    Expected Outcomes Patient will complete the program meeting both personal and program goals.             Core Components/Risk Factors/Patient Goals Review:    Core Components/Risk Factors/Patient Goals at Discharge (Final Review):    ITP Comments:   Comments: Patient arrived for 1st visit/orientation/education at 0800. Patient was referred to CR by Quay Burow, MD due to STEMI (I21.3) and S/P Coronary artery stent placement (Z95.5). During orientation advised patient on arrival and appointment times what to wear,  what to do before, during and after exercise. Reviewed attendance and class policy.  Pt is scheduled to return Cardiac Rehab on 03/03/21 at 11:00. Pt was advised to come to class 15 minutes before class starts.  Discussed RPE/Dpysnea scales. Patient participated in warm up stretches. Patient was able to complete 6 minute walk test.  Telemetry:NSR. Patient was measured for the equipment. Discussed equipment safety with patient. Took patient pre-anthropometric measurements. Patient finished visit at 1000.

## 2021-02-27 NOTE — Telephone Encounter (Signed)
Dr. Ellyn Hack, Ms. Loor is supposed to start cardiac rehab today. I just read the note about her not being able to wear her life vest. Do you recommend we move forward with rehab with her not wearing the vest?  Thanks

## 2021-03-03 ENCOUNTER — Encounter (HOSPITAL_COMMUNITY)
Admission: RE | Admit: 2021-03-03 | Discharge: 2021-03-03 | Disposition: A | Discharge status: Home / Self Care | Payer: Medicare Other | Source: Ambulatory Visit | Attending: Cardiovascular Disease | Admitting: Cardiovascular Disease

## 2021-03-03 VITALS — Wt 136.2 lb

## 2021-03-03 DIAGNOSIS — Z955 Presence of coronary angioplasty implant and graft: Secondary | ICD-10-CM

## 2021-03-03 DIAGNOSIS — I213 ST elevation (STEMI) myocardial infarction of unspecified site: Secondary | ICD-10-CM | POA: Diagnosis not present

## 2021-03-03 NOTE — Progress Notes (Signed)
Daily Session Note  Patient Details  Name: Amber Peterson MRN: [id number] Date of Birth: 03-15-1956 Referring Provider:   Flowsheet Row CARDIAC REHAB PHASE II ORIENTATION from 02/27/2021 in [place]  Referring Provider Dr. Ellyn Hack       Encounter Date: 03/03/2021  Check In:  Session Check In - 03/03/21 1137       Check-In   Supervising physician immediately available to respond to emergencies [facility] MD immediately available    Physician(s) Dr. Harl Bowie    Location [facility]    Staff Present Aundra Dubin, RN, BSN;Heather Otho Ket, BS, Exercise Physiologist;Early Ord Kris Mouton, MS, ACSM-CEP, Exercise Physiologist    Virtual Visit No    Medication changes reported     No    Fall or balance concerns reported    No    Tobacco Cessation No Change    Warm-up and Cool-down Performed as group-led instruction    Resistance Training Performed Yes    VAD Patient? No    PAD/SET Patient? No      Pain Assessment   Currently in Pain? No/denies    Pain Score 0-No pain    Multiple Pain Sites No             Capillary Blood Glucose: No results found for this or any previous visit (from the past 24 hour(s)).    Social History   Tobacco Use  Smoking Status Never  Smokeless Tobacco Never    Goals Met:  Independence with exercise equipment Exercise tolerated well No report of concerns or symptoms today Strength training completed today  Goals Unmet:  Not Applicable  Comments: checkout time is 1200   Dr. Kathie Dike is Medical Director for [facility].

## 2021-03-04 ENCOUNTER — Telehealth: Payer: Self-pay | Admitting: Cardiology

## 2021-03-04 NOTE — Telephone Encounter (Signed)
Spoke to pt. She report black stool x 1 week and 3 episodes of blood on toilet paper after bowel movement.   Dr. Ellyn Hack made aware and recommended pt hold ASA x 1 week. If symptoms does not resolve to contact pcp.   Pt made aware and verbalized understanding.

## 2021-03-04 NOTE — Telephone Encounter (Signed)
Pt c/o medication issue:  1. Name of Medication: ticagrelor (BRILINTA) 90 MG TABS tablet  2. How are you currently taking this medication (dosage and times per day)? AS DIRECTED  3. Are you having a reaction (difficulty breathing--STAT)?  NO  4. What is your medication issue?  PT HAVE HAD RECTAL BLEEDING SINCE SHE STARTED THIS MEDICINE

## 2021-03-05 ENCOUNTER — Encounter (HOSPITAL_COMMUNITY)
Admission: RE | Admit: 2021-03-05 | Discharge: 2021-03-05 | Disposition: A | Discharge status: Home / Self Care | Payer: Medicare Other | Source: Ambulatory Visit | Attending: Cardiovascular Disease | Admitting: Cardiovascular Disease

## 2021-03-05 DIAGNOSIS — I213 ST elevation (STEMI) myocardial infarction of unspecified site: Secondary | ICD-10-CM | POA: Diagnosis not present

## 2021-03-05 DIAGNOSIS — Z955 Presence of coronary angioplasty implant and graft: Secondary | ICD-10-CM

## 2021-03-05 NOTE — Progress Notes (Signed)
Daily Session Note  Patient Details  Name: Amber Peterson MRN: [id number] Date of Birth: [birth year] Referring Provider:   Flowsheet Row CARDIAC REHAB PHASE II ORIENTATION from 02/27/2021 in [place]  Referring Provider Dr. Ellyn Hack       Encounter Date: 03/05/2021  Check In:  Session Check In - 03/05/21 1100       Check-In   Supervising physician immediately available to respond to emergencies [facility] MD immediately available    Physician(s) Dr. Harl Bowie    Location [facility]    Staff Present Aundra Dubin, RN, BSN;Heather Otho Ket, BS, Exercise Physiologist;Kaion Tisdale Kris Mouton, MS, ACSM-CEP, Exercise Physiologist    Virtual Visit No    Medication changes reported     No    Fall or balance concerns reported    No    Tobacco Cessation No Change    Warm-up and Cool-down Performed as group-led instruction    Resistance Training Performed Yes    VAD Patient? No    PAD/SET Patient? No      Pain Assessment   Currently in Pain? No/denies    Pain Score 0-No pain    Multiple Pain Sites No             Capillary Blood Glucose: No results found for this or any previous visit (from the past 24 hour(s)).    Social History   Tobacco Use  Smoking Status Never  Smokeless Tobacco Never    Goals Met:  Independence with exercise equipment Exercise tolerated well No report of concerns or symptoms today Strength training completed today  Goals Unmet:  Not Applicable  Comments: checkout time is 1200   Dr. Kathie Dike is Medical Director for [facility].

## 2021-03-07 ENCOUNTER — Encounter (HOSPITAL_COMMUNITY): Payer: Medicare Other

## 2021-03-10 ENCOUNTER — Encounter (HOSPITAL_COMMUNITY): Payer: Medicare Other

## 2021-03-10 DIAGNOSIS — E782 Mixed hyperlipidemia: Secondary | ICD-10-CM | POA: Diagnosis not present

## 2021-03-10 DIAGNOSIS — E0590 Thyrotoxicosis, unspecified without thyrotoxic crisis or storm: Secondary | ICD-10-CM | POA: Diagnosis not present

## 2021-03-10 DIAGNOSIS — E538 Deficiency of other specified B group vitamins: Secondary | ICD-10-CM | POA: Diagnosis not present

## 2021-03-10 DIAGNOSIS — R7303 Prediabetes: Secondary | ICD-10-CM | POA: Diagnosis not present

## 2021-03-12 ENCOUNTER — Encounter (HOSPITAL_COMMUNITY): Payer: Medicare Other

## 2021-03-12 DIAGNOSIS — R7303 Prediabetes: Secondary | ICD-10-CM | POA: Diagnosis not present

## 2021-03-12 DIAGNOSIS — R809 Proteinuria, unspecified: Secondary | ICD-10-CM | POA: Diagnosis not present

## 2021-03-12 DIAGNOSIS — I2510 Atherosclerotic heart disease of native coronary artery without angina pectoris: Secondary | ICD-10-CM | POA: Diagnosis not present

## 2021-03-12 DIAGNOSIS — J4520 Mild intermittent asthma, uncomplicated: Secondary | ICD-10-CM | POA: Diagnosis not present

## 2021-03-12 DIAGNOSIS — I10 Essential (primary) hypertension: Secondary | ICD-10-CM | POA: Diagnosis not present

## 2021-03-12 DIAGNOSIS — E782 Mixed hyperlipidemia: Secondary | ICD-10-CM | POA: Diagnosis not present

## 2021-03-12 DIAGNOSIS — I255 Ischemic cardiomyopathy: Secondary | ICD-10-CM | POA: Diagnosis not present

## 2021-03-12 DIAGNOSIS — I739 Peripheral vascular disease, unspecified: Secondary | ICD-10-CM | POA: Diagnosis not present

## 2021-03-12 DIAGNOSIS — M5451 Vertebrogenic low back pain: Secondary | ICD-10-CM | POA: Diagnosis not present

## 2021-03-12 DIAGNOSIS — E538 Deficiency of other specified B group vitamins: Secondary | ICD-10-CM | POA: Diagnosis not present

## 2021-03-12 DIAGNOSIS — M79605 Pain in left leg: Secondary | ICD-10-CM | POA: Diagnosis not present

## 2021-03-12 DIAGNOSIS — G894 Chronic pain syndrome: Secondary | ICD-10-CM | POA: Diagnosis not present

## 2021-03-12 DIAGNOSIS — N1831 Chronic kidney disease, stage 3a: Secondary | ICD-10-CM | POA: Diagnosis not present

## 2021-03-12 DIAGNOSIS — I5022 Chronic systolic (congestive) heart failure: Secondary | ICD-10-CM | POA: Diagnosis not present

## 2021-03-14 ENCOUNTER — Encounter (HOSPITAL_COMMUNITY)
Admission: RE | Admit: 2021-03-14 | Discharge: 2021-03-14 | Disposition: A | Discharge status: Home / Self Care | Payer: Medicare Other | Source: Ambulatory Visit | Attending: Cardiovascular Disease | Admitting: Cardiovascular Disease

## 2021-03-14 DIAGNOSIS — I213 ST elevation (STEMI) myocardial infarction of unspecified site: Secondary | ICD-10-CM | POA: Diagnosis not present

## 2021-03-14 DIAGNOSIS — Z955 Presence of coronary angioplasty implant and graft: Secondary | ICD-10-CM | POA: Insufficient documentation

## 2021-03-14 NOTE — Progress Notes (Signed)
Daily Session Note  Patient Details  Name: Amber Peterson MRN: [id number] Date of Birth: 1955/11/01 Referring Provider:   Flowsheet Row CARDIAC REHAB PHASE II ORIENTATION from 02/27/2021 in [place]  Referring Provider Dr. Ellyn Hack       Encounter Date: 03/14/2021  Check In:  Session Check In - 03/14/21 1100       Check-In   Supervising physician immediately available to respond to emergencies [facility] MD immediately available    Physician(s) Dr. Domenic Polite    Location [facility]    Staff Present Aundra Dubin, RN, BSN;Antigone Crowell Otho Ket, BS, Exercise Physiologist;[place] Kris Mouton, MS, ACSM-CEP, Exercise Physiologist    Virtual Visit No    Medication changes reported     No    Fall or balance concerns reported    No    Tobacco Cessation No Change    Warm-up and Cool-down Performed as group-led instruction    Resistance Training Performed Yes    VAD Patient? No    PAD/SET Patient? No      Pain Assessment   Currently in Pain? No/denies    Pain Score 0-No pain    Multiple Pain Sites No             Capillary Blood Glucose: No results found for this or any previous visit (from the past 24 hour(s)).    Social History   Tobacco Use  Smoking Status Never  Smokeless Tobacco Never    Goals Met:  Independence with exercise equipment Exercise tolerated well No report of concerns or symptoms today Strength training completed today  Goals Unmet:  Not Applicable  Comments: check out 1200   Dr. Kathie Dike is Medical Director for [facility].

## 2021-03-17 ENCOUNTER — Other Ambulatory Visit: Payer: Self-pay

## 2021-03-17 ENCOUNTER — Encounter (INDEPENDENT_AMBULATORY_CARE_PROVIDER_SITE_OTHER): Payer: Self-pay | Admitting: *Deleted

## 2021-03-17 ENCOUNTER — Encounter (HOSPITAL_COMMUNITY)
Admission: RE | Admit: 2021-03-17 | Discharge: 2021-03-17 | Disposition: A | Discharge status: Home / Self Care | Payer: Medicare Other | Source: Ambulatory Visit | Attending: Cardiovascular Disease | Admitting: Cardiovascular Disease

## 2021-03-17 VITALS — Wt 137.6 lb

## 2021-03-17 DIAGNOSIS — I213 ST elevation (STEMI) myocardial infarction of unspecified site: Secondary | ICD-10-CM | POA: Diagnosis not present

## 2021-03-17 DIAGNOSIS — Z955 Presence of coronary angioplasty implant and graft: Secondary | ICD-10-CM | POA: Diagnosis not present

## 2021-03-17 NOTE — Progress Notes (Signed)
Daily Session Note  Patient Details  Name: Amber Peterson MRN: [id number] Date of Birth: 1956/01/10 Referring Provider:   Flowsheet Row CARDIAC REHAB PHASE II ORIENTATION from 02/27/2021 in [place]  Referring Provider Dr. Ellyn Hack       Encounter Date: 03/17/2021  Check In:  Session Check In - 03/17/21 1100       Check-In   Supervising physician immediately available to respond to emergencies [facility] MD immediately available    Physician(s) Dr. Harl Bowie    Location [facility]    Staff Present Aundra Dubin, RN, BSN;Michelina Mexicano Otho Ket, BS, Exercise Physiologist;Dal[place] Kris Mouton, MS, ACSM-CEP, Exercise Physiologist    Virtual Visit No    Medication changes reported     No    Fall or balance concerns reported    No    Tobacco Cessation No Change    Warm-up and Cool-down Performed as group-led instruction    Resistance Training Performed Yes    VAD Patient? No    PAD/SET Patient? No      Pain Assessment   Currently in Pain? No/denies    Pain Score 0-No pain    Multiple Pain Sites No             Capillary Blood Glucose: No results found for this or any previous visit (from the past 24 hour(s)).    Social History   Tobacco Use  Smoking Status Never  Smokeless Tobacco Never    Goals Met:  Independence with exercise equipment Exercise tolerated well No report of concerns or symptoms today Strength training completed today  Goals Unmet:  Not Applicable  Comments: check out 1200   Dr. Kathie Dike is Medical Director for [facility].

## 2021-03-18 ENCOUNTER — Telehealth: Payer: Self-pay | Admitting: Cardiology

## 2021-03-18 NOTE — Telephone Encounter (Signed)
Called the patient back. She stated that she has been on the 80 mg atorvastatin and never decreased it to 40 mg as directed at her office visit in September. She stated that she is still having joint pain and would like to decrease the Atorvastatin to 40 mg. She will decrease it until her follow up with Dr. Ellyn Hack and reassess then.   She has a follow up in January with Dr. Ellyn Hack.

## 2021-03-18 NOTE — Telephone Encounter (Signed)
Pt c/o medication issue:  1. Name of Medication: atorvastatin (LIPITOR) 40 MG tablet  2. How are you currently taking this medication (dosage and times per day)? Patient taking 80 mg daily   3. Are you having a reaction (difficulty breathing--STAT)?   4. What is your medication issue? Patient found the bottle where the rx was reduced. She has been taking the old dose but wanted to know what is the correct dosage to take. Please advise

## 2021-03-19 ENCOUNTER — Encounter (HOSPITAL_COMMUNITY)
Admission: RE | Admit: 2021-03-19 | Discharge: 2021-03-19 | Disposition: A | Discharge status: Home / Self Care | Payer: Medicare Other | Source: Ambulatory Visit | Attending: Cardiovascular Disease | Admitting: Cardiovascular Disease

## 2021-03-19 ENCOUNTER — Telehealth: Payer: Self-pay | Admitting: Cardiology

## 2021-03-19 DIAGNOSIS — Z955 Presence of coronary angioplasty implant and graft: Secondary | ICD-10-CM

## 2021-03-19 DIAGNOSIS — I213 ST elevation (STEMI) myocardial infarction of unspecified site: Secondary | ICD-10-CM | POA: Diagnosis not present

## 2021-03-19 DIAGNOSIS — R195 Other fecal abnormalities: Secondary | ICD-10-CM

## 2021-03-19 NOTE — Progress Notes (Signed)
Cardiac Individual Treatment Plan  Patient Details  Name: Amber Peterson MRN: [id number] Date of Birth: [birth year] Referring Provider:   Flowsheet Row CARDIAC REHAB PHASE II ORIENTATION from 02/27/2021 in [place]  Referring Provider Dr. Ellyn Hack       Initial Encounter Date:  Flowsheet Row CARDIAC REHAB PHASE II ORIENTATION from 02/27/2021 in [place]  Date 02/27/21       Visit Diagnosis: ST elevation myocardial infarction (STEMI), unspecified artery ([place])  S/P coronary artery stent placement  Patient's Home Medications on Admission:  Current Outpatient Medications:    acetaminophen (TYLENOL) 500 MG tablet, Take 2,000 mg by mouth 4 (four) times daily as needed for moderate pain., Disp: , Rfl:    albuterol (VENTOLIN HFA) 108 (90 Base) MCG/ACT inhaler, Inhale 2 puffs into the lungs every 6 (six) hours as needed for wheezing or shortness of breath., Disp: , Rfl:    ALPRAZolam (XANAX) 0.5 MG tablet, Take 0.5 mg by mouth 2 (two) times daily., Disp: , Rfl:    Ascorbic Acid (VITAMIN C PO), Take 1 tablet by mouth daily., Disp: , Rfl:    aspirin 81 MG EC tablet, Take 1 tablet (81 mg total) by mouth daily., Disp: 90 tablet, Rfl: 3   atorvastatin (LIPITOR) 40 MG tablet, Take 1 tablet (40 mg total) by mouth daily., Disp: 90 tablet, Rfl: 3   Biotin 1000 MCG tablet, Take 1,000 mcg by mouth daily., Disp: , Rfl:    budesonide-formoterol (SYMBICORT) 160-4.5 MCG/ACT inhaler, Inhale 2 puffs into the lungs 2 (two) times daily., Disp: , Rfl:    Cholecalciferol (DIALYVITE VITAMIN D 5000 PO), Take 5,000 Units by mouth daily., Disp: , Rfl:    dapagliflozin propanediol (FARXIGA) 10 MG TABS tablet, Take 1 tablet (10 mg total) by mouth daily before breakfast. (Patient not taking: No sig reported), Disp: 90 tablet, Rfl: 3   gabapentin (NEURONTIN) 300 MG capsule, Take 1 capsule (300 mg total) by mouth 3 (three) times daily. (Patient taking differently: Take 300  mg by mouth 3 (three) times daily as needed (pain).), Disp: 60 capsule, Rfl: 0   hydrocortisone cream 1 %, Apply 1 application topically daily as needed for itching., Disp: , Rfl:    metoprolol succinate (TOPROL-XL) 25 MG 24 hr tablet, Take 1 tablet (25 mg total) by mouth daily., Disp: 30 tablet, Rfl: 5   nitroGLYCERIN (NITROSTAT) 0.4 MG SL tablet, Place 1 tablet (0.4 mg total) under the tongue every 5 (five) minutes as needed for chest pain., Disp: 25 tablet, Rfl: 1   sacubitril-valsartan (ENTRESTO) 24-26 MG, Take 1 tablet by mouth 2 (two) times daily., Disp: 60 tablet, Rfl: 5   spironolactone (ALDACTONE) 25 MG tablet, Take 0.5 tablets (12.5 mg total) by mouth daily., Disp: 30 tablet, Rfl: 11   Tetrahydrozoline HCl (VISINE OP), Place 1 drop into both eyes daily as needed (dry eyes)., Disp: , Rfl:    thyroid (ARMOUR) 15 MG tablet, Take 15 mg by mouth daily., Disp: , Rfl:    ticagrelor (BRILINTA) 90 MG TABS tablet, Take 1 tablet (90 mg total) by mouth 2 (two) times daily., Disp: 60 tablet, Rfl: 5   traZODone (DESYREL) 50 MG tablet, Take 150 mg by mouth at bedtime., Disp: , Rfl:    venlafaxine XR (EFFEXOR-XR) 150 MG 24 hr capsule, Take 150 mg by mouth daily with breakfast., Disp: , Rfl:    vitamin B-12 (CYANOCOBALAMIN) 1000 MCG tablet, Take 1,000 mcg by mouth daily., Disp: , Rfl:  zinc gluconate 50 MG tablet, Take 50 mg by mouth daily., Disp: , Rfl:   Past Medical History: Past Medical History:  Diagnosis Date   Acute ST elevation myocardial infarction (STEMI) due to occlusion of distal portion of left anterior descending (LAD) coronary artery (HCC) 12/08/2020   Anxiety    Arthritis    all major joint   Asthma 07/22/2017   Coronary artery disease involving native coronary artery of native heart with unstable angina pectoris ([place]) 12/08/2020   12/08/2020: 100 % mLAD - DES PCI Onyx Frontier DES 2.5 x 26; 80%-99% bifurcation LCx-OM/AVG Cx (plan staged PCI)>   Depression    Hepatitis C 07/22/2017    History of kidney stones    Hypertension    Ischemic cardiomyopathy 12/10/2020   EF 30 to 35% with anterior, anterolateral and inferior hypokinesis to akinesis consistent with LAD infarct.    Tobacco Use: Social History   Tobacco Use  Smoking Status Never  Smokeless Tobacco Never    Labs: Recent Review Flowsheet Data     Labs for ITP Cardiac and Pulmonary Rehab Latest Ref Rng & Units 12/08/2020 01/21/2021   Cholestrol 100 - 199 mg/dL 218(H) 118   LDLCALC 0 - 99 mg/dL 135(H) 45   HDL >39 mg/dL 56 53   Trlycerides 0 - 149 mg/dL 135 111   Hemoglobin A1c 4.8 - 5.6 % 5.6 -       Capillary Blood Glucose: No results found for: GLUCAP   Exercise Target Goals: Exercise Program Goal: Individual exercise prescription set using results from initial 6 min walk test and THRR while considering  patient's activity barriers and safety.   Exercise Prescription Goal: Starting with aerobic activity 30 plus minutes a day, 3 days per week for initial exercise prescription. Provide home exercise prescription and guidelines that participant acknowledges understanding prior to discharge.  Activity Barriers & Risk Stratification:  Activity Barriers & Cardiac Risk Stratification - 02/27/21 0830       Activity Barriers & Cardiac Risk Stratification   Activity Barriers Arthritis;Neck/Spine Problems;Joint Problems;Muscular Weakness;History of Falls    Cardiac Risk Stratification High             6 Minute Walk:  6 Minute Walk     Row Name 02/27/21 1049         6 Minute Walk   Phase Initial     Distance 1200 feet     Walk Time 6 minutes     # of Rest Breaks 0     MPH 2.3     METS 2.58     RPE 11     VO2 Peak 8.83     Symptoms No     Resting HR 83 bpm     Resting BP 98/60     Resting Oxygen Saturation  98 %     Exercise Oxygen Saturation  during 6 min walk 99 %     Max Ex. HR 94 bpm     Max Ex. BP 115/65     2 Minute Post BP 96/60              Oxygen Initial  Assessment:   Oxygen Re-Evaluation:   Oxygen Discharge (Final Oxygen Re-Evaluation):   Initial Exercise Prescription:  Initial Exercise Prescription - 02/27/21 1000       Date of Initial Exercise RX and Referring Provider   Date 02/27/21    Referring Provider Dr. Ellyn Hack    Expected Discharge Date 05/23/21  Treadmill   MPH 1.5    Grade 0    Minutes 17      NuStep   Level 1    SPM 60    Minutes 22      Prescription Details   Frequency (times per week) 3    Duration Progress to 30 minutes of continuous aerobic without signs/symptoms of physical distress      Intensity   THRR 40-80% of Max Heartrate 62-123    Ratings of Perceived Exertion 11-13    Perceived Dyspnea 0-4      Resistance Training   Training Prescription Yes    Weight 3    Reps 10-15             Perform Capillary Blood Glucose checks as needed.  Exercise Prescription Changes:   Exercise Prescription Changes     Row Name 03/03/21 1200 03/17/21 1150           Response to Exercise   Blood Pressure (Admit) 96/60 --      Blood Pressure (Exercise) 120/62 140/65      Blood Pressure (Exit) 94/58 118/70      Heart Rate (Admit) 88 bpm 80 bpm      Heart Rate (Exercise) 110 bpm 106 bpm      Heart Rate (Exit) 97 bpm 89 bpm      Rating of Perceived Exertion (Exercise) 13 11      Duration Continue with 30 min of aerobic exercise without signs/symptoms of physical distress. Continue with 30 min of aerobic exercise without signs/symptoms of physical distress.      Intensity THRR unchanged THRR unchanged        Progression   Progression Continue to progress workloads to maintain intensity without signs/symptoms of physical distress. Continue to progress workloads to maintain intensity without signs/symptoms of physical distress.        Resistance Training   Training Prescription Yes Yes      Weight 3 3      Reps 10-15 10-15      Time 10 Minutes 10 Minutes        Treadmill   MPH 1.9 1.8       Grade 0 0      Minutes 17 17      METs 2.45 2.38        NuStep   Level 1 1      SPM 78 77      Minutes 22 22      METs 1.91 1.86               Exercise Comments:   Exercise Goals and Review:   Exercise Goals     Row Name 02/27/21 1052 03/18/21 0812           Exercise Goals   Increase Physical Activity Yes Yes      Intervention Provide advice, education, support and counseling about physical activity/exercise needs.;Develop an individualized exercise prescription for aerobic and resistive training based on initial evaluation findings, risk stratification, comorbidities and participant's personal goals. Provide advice, education, support and counseling about physical activity/exercise needs.;Develop an individualized exercise prescription for aerobic and resistive training based on initial evaluation findings, risk stratification, comorbidities and participant's personal goals.      Expected Outcomes Short Term: Attend rehab on a regular basis to increase amount of physical activity.;Long Term: Add in home exercise to make exercise part of routine and to increase amount of physical activity.;Long Term: Exercising regularly at least 3-5  days a week. Short Term: Attend rehab on a regular basis to increase amount of physical activity.;Long Term: Add in home exercise to make exercise part of routine and to increase amount of physical activity.;Long Term: Exercising regularly at least 3-5 days a week.      Increase Strength and Stamina Yes Yes      Intervention Provide advice, education, support and counseling about physical activity/exercise needs.;Develop an individualized exercise prescription for aerobic and resistive training based on initial evaluation findings, risk stratification, comorbidities and participant's personal goals. Provide advice, education, support and counseling about physical activity/exercise needs.;Develop an individualized exercise prescription for aerobic and  resistive training based on initial evaluation findings, risk stratification, comorbidities and participant's personal goals.      Expected Outcomes Short Term: Increase workloads from initial exercise prescription for resistance, speed, and METs.;Short Term: Perform resistance training exercises routinely during rehab and add in resistance training at home;Long Term: Improve cardiorespiratory fitness, muscular endurance and strength as measured by increased METs and functional capacity (6MWT) Short Term: Increase workloads from initial exercise prescription for resistance, speed, and METs.;Short Term: Perform resistance training exercises routinely during rehab and add in resistance training at home;Long Term: Improve cardiorespiratory fitness, muscular endurance and strength as measured by increased METs and functional capacity (6MWT)      Able to understand and use rate of perceived exertion (RPE) scale Yes Yes      Intervention Provide education and explanation on how to use RPE scale Provide education and explanation on how to use RPE scale      Expected Outcomes Short Term: Able to use RPE daily in rehab to express subjective intensity level;Long Term:  Able to use RPE to guide intensity level when exercising independently Short Term: Able to use RPE daily in rehab to express subjective intensity level;Long Term:  Able to use RPE to guide intensity level when exercising independently      Knowledge and understanding of Target Heart Rate Range (THRR) Yes Yes      Intervention Provide education and explanation of THRR including how the numbers were predicted and where they are located for reference Provide education and explanation of THRR including how the numbers were predicted and where they are located for reference      Expected Outcomes Short Term: Able to state/look up THRR;Short Term: Able to use daily as guideline for intensity in rehab;Long Term: Able to use THRR to govern intensity when  exercising independently Short Term: Able to state/look up THRR;Short Term: Able to use daily as guideline for intensity in rehab;Long Term: Able to use THRR to govern intensity when exercising independently      Able to check pulse independently Yes Yes      Intervention Provide education and demonstration on how to check pulse in carotid and radial arteries.;Review the importance of being able to check your own pulse for safety during independent exercise Provide education and demonstration on how to check pulse in carotid and radial arteries.;Review the importance of being able to check your own pulse for safety during independent exercise      Expected Outcomes Short Term: Able to explain why pulse checking is important during independent exercise;Long Term: Able to check pulse independently and accurately Short Term: Able to explain why pulse checking is important during independent exercise;Long Term: Able to check pulse independently and accurately      Understanding of Exercise Prescription Yes Yes      Intervention Provide education, explanation, and written materials  on patient's individual exercise prescription Provide education, explanation, and written materials on patient's individual exercise prescription      Expected Outcomes Short Term: Able to explain program exercise prescription;Long Term: Able to explain home exercise prescription to exercise independently Short Term: Able to explain program exercise prescription;Long Term: Able to explain home exercise prescription to exercise independently               Exercise Goals Re-Evaluation :  Exercise Goals Re-Evaluation     Row Name 03/18/21 0812             Exercise Goal Re-Evaluation   Exercise Goals Review Increase Physical Activity;Increase Strength and Stamina;Able to understand and use rate of perceived exertion (RPE) scale;Knowledge and understanding of Target Heart Rate Range (THRR);Able to check pulse  independently;Understanding of Exercise Prescription       Comments Pt has completed 5 sessions of cardiac rehab. She is motivated during class. She is currently exercising at 2.38 METs on the TM. Will continue to monitor and progress as able.       Expected Outcomes Through exercise at rehab and at home, the patient will meet their stated goals.                 Discharge Exercise Prescription (Final Exercise Prescription Changes):  Exercise Prescription Changes - 03/17/21 1150       Response to Exercise   Blood Pressure (Exercise) 140/65    Blood Pressure (Exit) 118/70    Heart Rate (Admit) 80 bpm    Heart Rate (Exercise) 106 bpm    Heart Rate (Exit) 89 bpm    Rating of Perceived Exertion (Exercise) 11    Duration Continue with 30 min of aerobic exercise without signs/symptoms of physical distress.    Intensity THRR unchanged      Progression   Progression Continue to progress workloads to maintain intensity without signs/symptoms of physical distress.      Resistance Training   Training Prescription Yes    Weight 3    Reps 10-15    Time 10 Minutes      Treadmill   MPH 1.8    Grade 0    Minutes 17    METs 2.38      NuStep   Level 1    SPM 77    Minutes 22    METs 1.86             Nutrition:  Target Goals: Understanding of nutrition guidelines, daily intake of sodium 1500mg , cholesterol 200mg , calories 30% from fat and 7% or less from saturated fats, daily to have 5 or more servings of fruits and vegetables.  Biometrics:  Pre Biometrics - 02/27/21 1052       Pre Biometrics   Height 4\' 10"  (1.473 m)    Weight 62.8 kg    Waist Circumference 35.6 inches    Hip Circumference 38 inches    Waist to Hip Ratio 0.94 %    BMI (Calculated) 28.94    Triceps Skinfold 13 mm    % Body Fat 36.8 %    Grip Strength 20.6 kg    Flexibility 13 in    Single Leg Stand 60 seconds              Nutrition Therapy Plan and Nutrition Goals:  Nutrition Therapy &  Goals - 02/27/21 0910       Personal Nutrition Goals   Comments Patient scored 13 on her diet assessment. Handout discussed and  provided regarding healthier choices. We offer 2 educational sessions on heart healthy nutrition with handouts. She says she follows a low sodium diet no more that 2000 mg/day and does not eat any red meat.      Intervention Plan   Intervention Nutrition handout(s) given to patient.    Expected Outcomes Short Term Goal: Understand basic principles of dietary content, such as calories, fat, sodium, cholesterol and nutrients.             Nutrition Assessments:  Nutrition Assessments - 02/27/21 0910       MEDFICTS Scores   Pre Score 13            MEDIFICTS Score Key: ?70 Need to make dietary changes  40-70 Heart Healthy Diet ? 40 Therapeutic Level Cholesterol Diet   Picture Your Plate Scores: <17 Unhealthy dietary pattern with much room for improvement. 41-50 Dietary pattern unlikely to meet recommendations for good health and room for improvement. 51-60 More healthful dietary pattern, with some room for improvement.  >60 Healthy dietary pattern, although there may be some specific behaviors that could be improved.    Nutrition Goals Re-Evaluation:   Nutrition Goals Discharge (Final Nutrition Goals Re-Evaluation):   Psychosocial: Target Goals: Acknowledge presence or absence of significant depression and/or stress, maximize coping skills, provide positive support system. Participant is able to verbalize types and ability to use techniques and skills needed for reducing stress and depression.  Initial Review & Psychosocial Screening:  Initial Psych Review & Screening - 02/27/21 0916       Initial Review   Current issues with Current Depression;History of Depression;Current Anxiety/Panic      Family Dynamics   Good Support System? Yes      Barriers   Psychosocial barriers to participate in program The patient should benefit from  training in stress management and relaxation.;There are no identifiable barriers or psychosocial needs.      Screening Interventions   Interventions Encouraged to exercise;Provide feedback about the scores to participant;To provide support and resources with identified psychosocial needs    Expected Outcomes Short Term goal: Utilizing psychosocial counselor, staff and physician to assist with identification of specific Stressors or current issues interfering with healing process. Setting desired goal for each stressor or current issue identified.;Short Term goal: Identification and review with participant of any Quality of Life or Depression concerns found by scoring the questionnaire.;Long Term goal: The participant improves quality of Life and PHQ9 Scores as seen by post scores and/or verbalization of changes             Quality of Life Scores:  Quality of Life - 02/27/21 1053       Quality of Life   Select Quality of Life      Quality of Life Scores   Health/Function Pre 24.81 %    Socioeconomic Pre 24.21 %    Psych/Spiritual Pre 25.5 %    Family Pre 27.6 %    GLOBAL Pre 25.27 %            Scores of 19 and below usually indicate a poorer quality of life in these areas.  A difference of  2-3 points is a clinically meaningful difference.  A difference of 2-3 points in the total score of the Quality of Life Index has been associated with significant improvement in overall quality of life, self-image, physical symptoms, and general health in studies assessing change in quality of life.  PHQ-9: Recent Review Flowsheet Data  Depression screen PHQ 2/9 02/27/2021   Decreased Interest 1   Down, Depressed, Hopeless 1   PHQ - 2 Score 2   Altered sleeping 0   Tired, decreased energy 1   Change in appetite 2   Feeling bad or failure about yourself  1   Trouble concentrating 2   Moving slowly or fidgety/restless 0   Suicidal thoughts 0   PHQ-9 Score 8   Difficult doing  work/chores Not difficult at all      Interpretation of Total Score  Total Score Depression Severity:  1-4 = Minimal depression, 5-9 = Mild depression, 10-14 = Moderate depression, 15-19 = Moderately severe depression, 20-27 = Severe depression   Psychosocial Evaluation and Intervention:  Psychosocial Evaluation - 02/27/21 0916       Psychosocial Evaluation & Interventions   Interventions Stress management education;Relaxation education;Encouraged to exercise with the program and follow exercise prescription    Comments Patient has no psychosocial barriers identified to participate in CR. Her initial PHQ-9 score was 8. 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 She feels her anxiety and depression are managed well with medication. She has been on treatment longterm. She names her 2 sisters as her emotional support people. She says they are very close. She has one daugther that she is also very close with who just recently got married. She has a person she names as her signficiant other. She says they have been together for 18 years but she primarily lives alone. She demonstrates a strong interest in doing the program and improving her health overall. 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 Her cardiologist wanted her to wear the vest until repeat ECHO 03/24/21 and would discontinue if EF if at least 35%. She has not worn the vest since 11/3 due to skin breakdown on her back. She was told we would need clearence from her cardiologist before we proceed with any exercise without the vest. She agreed to wear the vest while in cardiac rehab. She is very anxious to start the program hoping this will imrpove her health overall and get herself and her heart stronger. She also wants to learn what she can and can't do and how hard she can push herself.    Expected Outcomes Patient will continue to have  no psychosocial barriers identified.    Continue Psychosocial Services  No Follow up required             Psychosocial Re-Evaluation:  Psychosocial Re-Evaluation     [place] Name 03/12/21 1307             Psychosocial Re-Evaluation   Current issues with Current Depression;History of Depression;Current Anxiety/Panic       Comments Patient is new to the program completing 2 sessions with current psychosocial issues of depression and GAD which are managed with Alprazolam and Effexor and Trazadone for sleep. Patient feels her issues are managed with medication. We will continue to monitor.       Expected Outcomes Patient psychosocial issues will continue to be managed with medication.       Interventions Stress management education;Encouraged to attend Cardiac Rehabilitation for the exercise;Relaxation education       Continue Psychosocial Services  Follow up required by staff                Psychosocial Discharge (Final Psychosocial Re-Evaluation):  Psychosocial Re-Evaluation -  03/12/21 1307       Psychosocial Re-Evaluation   Current issues with Current Depression;History of Depression;Current Anxiety/Panic    Comments Patient is new to the program completing 2 sessions with current psychosocial issues of depression and GAD which are managed with Alprazolam and Effexor and Trazadone for sleep. Patient feels her issues are managed with medication. We will continue to monitor.    Expected Outcomes Patient psychosocial issues will continue to be managed with medication.    Interventions Stress management education;Encouraged to attend Cardiac Rehabilitation for the exercise;Relaxation education    Continue Psychosocial Services  Follow up required by staff             Vocational Rehabilitation: Provide vocational rehab assistance to qualifying candidates.   Vocational Rehab Evaluation & Intervention:  Vocational Rehab - 02/27/21 0911       Initial Vocational Rehab  Evaluation & Intervention   Assessment shows need for Vocational Rehabilitation No      Vocational Rehab Re-Evaulation   Comments Patient is retired and does not need vocational rehab.             Education: Education Goals: Education classes will be provided on a weekly basis, covering required topics. Participant will state understanding/return demonstration of topics presented.  Learning Barriers/Preferences:  Learning Barriers/Preferences - 02/27/21 0911       Learning Barriers/Preferences   Learning Barriers None    Learning Preferences Verbal Instruction;Audio;Written Material;Skilled Demonstration             Education Topics: Hypertension, Hypertension Reduction -Define heart disease and high blood pressure. Discus how high blood pressure affects the body and ways to reduce high blood pressure.   Exercise and Your Heart -Discuss why it is important to exercise, the FITT principles of exercise, normal and abnormal responses to exercise, and how to exercise safely.   Angina -Discuss definition of angina, causes of angina, treatment of angina, and how to decrease risk of having angina.   Cardiac Medications -Review what the following cardiac medications are used for, how they affect the body, and side effects that may occur when taking the medications.  Medications include Aspirin, Beta blockers, calcium channel blockers, ACE Inhibitors, angiotensin receptor blockers, diuretics, digoxin, and antihyperlipidemics.   Congestive Heart Failure -Discuss the definition of CHF, how to live with CHF, the signs and symptoms of CHF, and how keep track of weight and sodium intake.   Heart Disease and Intimacy -Discus the effect sexual activity has on the heart, how changes occur during intimacy as we age, and safety during sexual activity.   Smoking Cessation / COPD -Discuss different methods to quit smoking, the health benefits of quitting smoking, and the definition  of COPD.   Nutrition I: Fats -Discuss the types of cholesterol, what cholesterol does to the heart, and how cholesterol levels can be controlled. Flowsheet Row CARDIAC REHAB PHASE II EXERCISE from 03/05/2021 in [place]  Date 03/05/21  Educator DF  Instruction Review Code 1- Verbalizes Understanding       Nutrition II: Labels -Discuss the different components of food labels and how to read food label   Heart Parts/Heart Disease and PAD -Discuss the anatomy of the heart, the pathway of blood circulation through the heart, and these are affected by heart disease.   Stress I: Signs and Symptoms -Discuss the causes of stress, how stress may lead to anxiety and depression, and ways to limit stress.   Stress II: Relaxation -Discuss different types of  relaxation techniques to limit stress.   Warning Signs of Stroke / TIA -Discuss definition of a stroke, what the signs and symptoms are of a stroke, and how to identify when someone is having stroke.   Knowledge Questionnaire Score:  Knowledge Questionnaire Score - 02/27/21 1011       Knowledge Questionnaire Score   Pre Score 24/28             Core Components/Risk Factors/Patient Goals at Admission:  Personal Goals and Risk Factors at Admission - 02/27/21 0912       Core Components/Risk Factors/Patient Goals on Admission    Weight Management Weight Maintenance    Lipids Yes    Intervention Provide education and support for participant on nutrition & aerobic/resistive exercise along with prescribed medications to achieve LDL 70mg , HDL >40mg .    Expected Outcomes Short Term: Participant states understanding of desired cholesterol values and is compliant with medications prescribed. Participant is following exercise prescription and nutrition guidelines.;Long Term: Cholesterol controlled with medications as prescribed, with individualized exercise RX and with personalized nutrition plan. Value goals:  LDL < 70mg , HDL > 40 mg.    Personal Goal Other Yes    Personal Goal Patient wants to get both herself stronger and her heart stronger. She want to learn what she can do and how far to push herself. She want to get healthier overall.    Intervention Patient will attend CR 3 days/week and supplement with exercise 2 days/week.    Expected Outcomes Patient will complete the program meeting both personal and program goals.             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 She is new to the program completing 2 sessions. She continues to wear a life vest for low EF with a repeat echo scheduled in December. Her personal goals for the program are to get healthier; get her heart stronger; learn how far to push herself. We will continue to monitor her progress as she works towards meeting these goals.       Expected Outcomes Patient will complete the program meeting both program and personal goals.                Core Components/Risk Factors/Patient Goals at Discharge (Final Review):   Goals and Risk Factor Review - 03/12/21 1309       Core Components/Risk Factors/Patient Goals Review   Personal Goals Review Lipids;Other    Review Patient was referred to CR with STEMI and DES. She is new to the program completing 2 sessions. She continues to wear a life vest for low EF with a repeat echo scheduled in December. Her personal goals for the program are to get healthier; get her heart stronger; learn how far to push herself. We will continue to monitor her progress as she works towards meeting these goals.    Expected Outcomes Patient will complete the program meeting both program and personal goals.             ITP Comments:   Comments: ITP REVIEW Pt is  making expected progress toward Cardiac Rehab goals after completing 5 sessions. Recommend continued exercise, life style modification, education, and increased  stamina and strength.

## 2021-03-19 NOTE — Telephone Encounter (Signed)
Returned call to patient of Dr. Ellyn Hack - she held ASA for x1 week started on 11/22. She started back on ASA end of last week and stools are black again. She is still on Brilinta. She did not contact PCP yet. Advised she do so, as she may need to see them or see GI MD.   Durene Cal to MD/RN to review

## 2021-03-19 NOTE — Telephone Encounter (Signed)
Per secure chat from Dr. Ellyn Hack:  Lets just stop aspirin.  I do think she needs to be evaluated by GI-if she can get her PCP to refer her, then we can refer her to GI.  Spoke with patient. Aware of med change. Med list updated. Referral to Dr. Carlean Purl ordered. She will check with this office to make sure they are in network for her

## 2021-03-19 NOTE — Telephone Encounter (Signed)
[facility] called after they spoke with the patient for a medication check.   Pt c/o medication issue:  1. Name of Medication: Aspirin   2. How are you currently taking this medication (dosage and times per day)? As directed  3. Are you having a reaction (difficulty breathing--STAT)?   4. What is your medication issue? Black and tar-like stools that started just after re-starting her medication. The [facility] Pharmacist recommended that we reach out to the patient to discuss her symptoms

## 2021-03-19 NOTE — Telephone Encounter (Signed)
Pt c/o medication issue:  1. Name of Medication: Aspirin 81 mg   2. How are you currently taking this medication (dosage and times per day)? 1 time a day  3. Are you having a reaction (difficulty breathing--STAT)?   4. What is your medication issue? Stools are black again

## 2021-03-19 NOTE — Progress Notes (Signed)
Daily Session Note  Patient Details  Name: Amber Peterson MRN: [id number] Date of Birth: July 19, 1955 Referring Provider:   Flowsheet Row CARDIAC REHAB PHASE II ORIENTATION from 02/27/2021 in [place]  Referring Provider Dr. Ellyn Hack       Encounter Date: 03/19/2021  Check In:  Session Check In - 03/19/21 1100       Check-In   Supervising physician immediately available to respond to emergencies [facility] MD immediately available    Physician(s) Dr. Harl Bowie    Location [facility]    Staff Present Aundra Dubin, RN, BSN;Caryle Helgeson Otho Ket, BS, Exercise Physiologist;Dalton Kris Mouton, MS, ACSM-CEP, Exercise Physiologist    Virtual Visit No    Medication changes reported     No    Fall or balance concerns reported    No    Tobacco Cessation No Change    Warm-up and Cool-down Performed as group-led instruction    Resistance Training Performed Yes    VAD Patient? No    PAD/SET Patient? No      Pain Assessment   Currently in Pain? No/denies    Pain Score 0-No pain    Multiple Pain Sites No             Capillary Blood Glucose: No results found for this or any previous visit (from the past 24 hour(s)).    Social History   Tobacco Use  Smoking Status Never  Smokeless Tobacco Never    Goals Met:  Independence with exercise equipment Exercise tolerated well No report of concerns or symptoms today Strength training completed today  Goals Unmet:  Not Applicable  Comments: check out 1200   Dr. Kathie Dike is Medical Director for [facility].

## 2021-03-19 NOTE — Telephone Encounter (Signed)
This Probation officer has already spoken with patient and provided recommendations from [place] MD

## 2021-03-21 ENCOUNTER — Encounter (HOSPITAL_COMMUNITY)
Admission: RE | Admit: 2021-03-21 | Discharge: 2021-03-21 | Disposition: A | Discharge status: Home / Self Care | Payer: Medicare Other | Source: Ambulatory Visit | Attending: Cardiovascular Disease | Admitting: Cardiovascular Disease

## 2021-03-21 DIAGNOSIS — I213 ST elevation (STEMI) myocardial infarction of unspecified site: Secondary | ICD-10-CM

## 2021-03-21 DIAGNOSIS — Z955 Presence of coronary angioplasty implant and graft: Secondary | ICD-10-CM

## 2021-03-21 NOTE — Progress Notes (Signed)
Daily Session Note  Patient Details  Name: Amber Peterson MRN: [id number] Date of Birth: [birth year] Referring Provider:   Flowsheet Row CARDIAC REHAB PHASE II ORIENTATION from 02/27/2021 in [place]  Referring Provider Dr. Ellyn Hack       Encounter Date: 03/21/2021  Check In:  Session Check In - 03/21/21 1100       Check-In   Supervising physician immediately available to respond to emergencies [facility] MD immediately available    Physician(s) Dr. Harl Bowie    Location [facility]    Staff Present Geanie Cooley, RN;Debra Wynetta Emery, RN, Bjorn Loser, MS, ACSM-CEP, Exercise Physiologist    Virtual Visit No    Fall or balance concerns reported    No    Tobacco Cessation No Change    Warm-up and Cool-down Performed as group-led instruction    Resistance Training Performed Yes    VAD Patient? No    PAD/SET Patient? No      Pain Assessment   Currently in Pain? No/denies    Pain Score 0-No pain    Multiple Pain Sites No             Capillary Blood Glucose: No results found for this or any previous visit (from the past 24 hour(s)).    Social History   Tobacco Use  Smoking Status Never  Smokeless Tobacco Never    Goals Met:  Independence with exercise equipment Exercise tolerated well No report of concerns or symptoms today Strength training completed today  Goals Unmet:  Not Applicable  Comments: check out @ 12:00pm   Dr. Kathie Dike is Medical Director for [facility] Pul[facility] Re[facility].

## 2021-03-24 ENCOUNTER — Ambulatory Visit (HOSPITAL_COMMUNITY): Payer: Medicare Other | Attending: Cardiology

## 2021-03-24 ENCOUNTER — Other Ambulatory Visit: Payer: Self-pay

## 2021-03-24 ENCOUNTER — Encounter (HOSPITAL_COMMUNITY): Payer: Medicare Other

## 2021-03-24 DIAGNOSIS — I255 Ischemic cardiomyopathy: Secondary | ICD-10-CM | POA: Diagnosis not present

## 2021-03-24 HISTORY — PX: TRANSTHORACIC ECHOCARDIOGRAM: SHX275

## 2021-03-24 LAB — ECHOCARDIOGRAM COMPLETE
Area-P 1/2: 3.53 cm2
S' Lateral: 2.8 cm

## 2021-03-24 MED ORDER — PERFLUTREN LIPID MICROSPHERE
1.0000 mL | INTRAVENOUS | Status: AC | PRN
Start: 2021-03-24 — End: 2021-03-24
  Administered 2021-03-24: 1 mL via INTRAVENOUS

## 2021-03-26 ENCOUNTER — Encounter (HOSPITAL_COMMUNITY)
Admission: RE | Admit: 2021-03-26 | Discharge: 2021-03-26 | Disposition: A | Discharge status: Home / Self Care | Payer: Medicare Other | Source: Ambulatory Visit | Attending: Cardiovascular Disease | Admitting: Cardiovascular Disease

## 2021-03-26 DIAGNOSIS — Z955 Presence of coronary angioplasty implant and graft: Secondary | ICD-10-CM | POA: Diagnosis not present

## 2021-03-26 DIAGNOSIS — I213 ST elevation (STEMI) myocardial infarction of unspecified site: Secondary | ICD-10-CM | POA: Diagnosis not present

## 2021-03-26 NOTE — Progress Notes (Signed)
Daily Session Note  Patient Details  Name: Amber Peterson MRN: [id number] Date of Birth: 1955-07-06 Referring Provider:   Flowsheet Row CARDIAC REHAB PHASE II ORIENTATION from 02/27/2021 in [place]  Referring Provider Dr. Ellyn Hack       Encounter Date: 03/26/2021  Check In:  Session Check In - 03/26/21 1100       Check-In   Supervising physician immediately available to respond to emergencies [facility] MD immediately available    Physician(s) Dr. Johney Frame    Location [facility]    Staff Present Geanie Cooley, RN;[place] Otho Ket, BS, Exercise Physiologist;Debra Wynetta Emery, RN, BSN    Virtual Visit No    Medication changes reported     No    Fall or balance concerns reported    No    Tobacco Cessation No Change    Warm-up and Cool-down Performed as group-led instruction    Resistance Training Performed Yes    VAD Patient? No    PAD/SET Patient? No      Pain Assessment   Currently in Pain? No/denies    Pain Score 0-No pain    Multiple Pain Sites No             Capillary Blood Glucose: No results found for this or any previous visit (from the past 24 hour(s)).    Social History   Tobacco Use  Smoking Status Never  Smokeless Tobacco Never    Goals Met:  Independence with exercise equipment Exercise tolerated well No report of concerns or symptoms today Strength training completed today  Goals Unmet:  Not Applicable  Comments: check out @ 12:00pm   Dr. Kathie Dike is Medical Director for [facility] Pul[facility] Re[facility].

## 2021-03-28 ENCOUNTER — Other Ambulatory Visit: Payer: Self-pay

## 2021-03-28 ENCOUNTER — Encounter (HOSPITAL_COMMUNITY)
Admission: RE | Admit: 2021-03-28 | Discharge: 2021-03-28 | Disposition: A | Discharge status: Home / Self Care | Payer: Medicare Other | Source: Ambulatory Visit | Attending: Cardiovascular Disease | Admitting: Cardiovascular Disease

## 2021-03-28 DIAGNOSIS — Z955 Presence of coronary angioplasty implant and graft: Secondary | ICD-10-CM

## 2021-03-28 DIAGNOSIS — I213 ST elevation (STEMI) myocardial infarction of unspecified site: Secondary | ICD-10-CM

## 2021-03-28 NOTE — Progress Notes (Signed)
Daily Session Note  Patient Details  Name: Amber Peterson MRN: [id number] Date of Birth: [birth year] Referring Provider:   Flowsheet Row CARDIAC REHAB PHASE II ORIENTATION from 02/27/2021 in [place]  Referring Provider Dr. Ellyn Hack       Encounter Date: 03/28/2021  Check In:  Session Check In - 03/28/21 1100       Check-In   Supervising physician immediately available to respond to emergencies [facility] MD immediately available    Physician(s) Dr. Domenic Polite    Location [facility]    Staff Present Hoy Register, MS, ACSM-CEP, Exercise Physiologist;Debra Wynetta Emery, RN, BSN;Heather Otho Ket, BS, Exercise Physiologist    Virtual Visit No    Medication changes reported     No    Fall or balance concerns reported    No    Tobacco Cessation No Change    Warm-up and Cool-down Performed as group-led instruction    Resistance Training Performed Yes    VAD Patient? No    PAD/SET Patient? No      Pain Assessment   Currently in Pain? No/denies    Pain Score 0-No pain    Multiple Pain Sites No             Capillary Blood Glucose: No results found for this or any previous visit (from the past 24 hour(s)).    Social History   Tobacco Use  Smoking Status Never  Smokeless Tobacco Never    Goals Met:  Independence with exercise equipment Exercise tolerated well No report of concerns or symptoms today Strength training completed today  Goals Unmet:  Not Applicable  Comments: checkout time is 1200   Dr. Kathie Dike is Medical Director for [facility] Pul[facility] Re[facility].

## 2021-03-31 ENCOUNTER — Encounter (HOSPITAL_COMMUNITY): Payer: Medicare Other

## 2021-04-02 ENCOUNTER — Encounter (HOSPITAL_COMMUNITY): Payer: Medicare Other

## 2021-04-02 ENCOUNTER — Telehealth: Payer: Self-pay | Admitting: Cardiology

## 2021-04-02 NOTE — Telephone Encounter (Signed)
° °  Pt said she tested positive for covid, she said she tried calling her pcp but wont get a cb until tomorrow. She said her throat is sore and she coughs a lot. She wanted to know what meds she can take that will not affect her heart meds.

## 2021-04-02 NOTE — Telephone Encounter (Signed)
Return call to pt, notified that she will need to wait for PCP to discuss COVID rx. Verbalized understanding

## 2021-04-03 DIAGNOSIS — U071 COVID-19: Secondary | ICD-10-CM | POA: Diagnosis not present

## 2021-04-03 DIAGNOSIS — J069 Acute upper respiratory infection, unspecified: Secondary | ICD-10-CM | POA: Diagnosis not present

## 2021-04-04 ENCOUNTER — Encounter (HOSPITAL_COMMUNITY): Payer: Medicare Other

## 2021-04-07 ENCOUNTER — Encounter (HOSPITAL_COMMUNITY): Payer: Medicare Other

## 2021-04-09 ENCOUNTER — Encounter (HOSPITAL_COMMUNITY): Payer: Medicare Other

## 2021-04-11 ENCOUNTER — Encounter (HOSPITAL_COMMUNITY): Payer: Medicare Other

## 2021-04-11 DIAGNOSIS — E782 Mixed hyperlipidemia: Secondary | ICD-10-CM | POA: Diagnosis not present

## 2021-04-11 DIAGNOSIS — I10 Essential (primary) hypertension: Secondary | ICD-10-CM | POA: Diagnosis not present

## 2021-04-14 ENCOUNTER — Encounter (HOSPITAL_COMMUNITY): Payer: Medicare Other

## 2021-04-16 ENCOUNTER — Encounter (HOSPITAL_COMMUNITY)
Admission: RE | Admit: 2021-04-16 | Discharge: 2021-04-16 | Disposition: A | Discharge status: Home / Self Care | Payer: Medicare Other | Source: Ambulatory Visit | Attending: Cardiovascular Disease | Admitting: Cardiovascular Disease

## 2021-04-16 DIAGNOSIS — I213 ST elevation (STEMI) myocardial infarction of unspecified site: Secondary | ICD-10-CM | POA: Diagnosis not present

## 2021-04-16 DIAGNOSIS — Z955 Presence of coronary angioplasty implant and graft: Secondary | ICD-10-CM | POA: Insufficient documentation

## 2021-04-16 NOTE — Progress Notes (Signed)
Cardiac Individual Treatment Plan  Patient Details  Name: Amber Peterson MRN: [id number] Date of Birth: Dec 14, 1955 Referring Provider:   Flowsheet Row CARDIAC REHAB PHASE II ORIENTATION from 02/27/2021 in [facility]  Referring Provider Dr. Ellyn Hack       Initial Encounter Date:  Flowsheet Row CARDIAC REHAB PHASE II ORIENTATION from 02/27/2021 in [facility]  Date 02/27/21       Visit Diagnosis: ST elevation myocardial infarction (STEMI), unspecified artery ([place])  S/P coronary artery stent placement  Patient's Home Medications on Admission:  Current Outpatient Medications:    acetaminophen (TYLENOL) 500 MG tablet, Take 2,000 mg by mouth 4 (four) times daily as needed for moderate pain., Disp: , Rfl:    albuterol (VENTOLIN HFA) 108 (90 Base) MCG/ACT inhaler, Inhale 2 puffs into the lungs every 6 (six) hours as needed for wheezing or shortness of breath., Disp: , Rfl:    ALPRAZolam (XANAX) 0.5 MG tablet, Take 0.5 mg by mouth 2 (two) times daily., Disp: , Rfl:    Ascorbic Acid (VITAMIN C PO), Take 1 tablet by mouth daily., Disp: , Rfl:    atorvastatin (LIPITOR) 40 MG tablet, Take 1 tablet (40 mg total) by mouth daily., Disp: 90 tablet, Rfl: 3   Biotin 1000 MCG tablet, Take 1,000 mcg by mouth daily., Disp: , Rfl:    budesonide-formoterol (SYMBICORT) 160-4.5 MCG/ACT inhaler, Inhale 2 puffs into the lungs 2 (two) times daily., Disp: , Rfl:    Cholecalciferol (DIALYVITE VITAMIN D 5000 PO), Take 5,000 Units by mouth daily., Disp: , Rfl:    dapagliflozin propanediol (FARXIGA) 10 MG TABS tablet, Take 1 tablet (10 mg total) by mouth daily before breakfast. (Patient not taking: No sig reported), Disp: 90 tablet, Rfl: 3   gabapentin (NEURONTIN) 300 MG capsule, Take 1 capsule (300 mg total) by mouth 3 (three) times daily. (Patient taking differently: Take 300 mg by mouth 3 (three) times daily as needed (pain).), Disp: 60 capsule, Rfl: 0   hydrocortisone  cream 1 %, Apply 1 application topically daily as needed for itching., Disp: , Rfl:    metoprolol succinate (TOPROL-XL) 25 MG 24 hr tablet, Take 1 tablet (25 mg total) by mouth daily., Disp: 30 tablet, Rfl: 5   nitroGLYCERIN (NITROSTAT) 0.4 MG SL tablet, Place 1 tablet (0.4 mg total) under the tongue every 5 (five) minutes as needed for chest pain., Disp: 25 tablet, Rfl: 1   sacubitril-valsartan (ENTRESTO) 24-26 MG, Take 1 tablet by mouth 2 (two) times daily., Disp: 60 tablet, Rfl: 5   spironolactone (ALDACTONE) 25 MG tablet, Take 0.5 tablets (12.5 mg total) by mouth daily., Disp: 30 tablet, Rfl: 11   Tetrahydrozoline HCl (VISINE OP), Place 1 drop into both eyes daily as needed (dry eyes)., Disp: , Rfl:    thyroid (ARMOUR) 15 MG tablet, Take 15 mg by mouth daily., Disp: , Rfl:    ticagrelor (BRILINTA) 90 MG TABS tablet, Take 1 tablet (90 mg total) by mouth 2 (two) times daily., Disp: 60 tablet, Rfl: 5   traZODone (DESYREL) 50 MG tablet, Take 150 mg by mouth at bedtime., Disp: , Rfl:    venlafaxine XR (EFFEXOR-XR) 150 MG 24 hr capsule, Take 150 mg by mouth daily with breakfast., Disp: , Rfl:    vitamin B-12 (CYANOCOBALAMIN) 1000 MCG tablet, Take 1,000 mcg by mouth daily., Disp: , Rfl:    zinc gluconate 50 MG tablet, Take 50 mg by mouth daily., Disp: , Rfl:   Past Medical History: Past  Medical History:  Diagnosis Date   Acute ST elevation myocardial infarction (STEMI) due to occlusion of distal portion of left anterior descending (LAD) coronary artery ([place]) 12/08/2020   Anxiety    Arthritis    all major joint   Asthma 07/22/2017   Coronary artery disease involving native coronary artery of native heart with unstable angina pectoris ([place]) 12/08/2020   12/08/2020: 100 % mLAD - DES PCI Onyx Frontier DES 2.5 x 26; 80%-99% bifurcation LCx-OM/AVG Cx (plan staged PCI)>   Depression    Hepatitis C 07/22/2017   History of kidney stones    Hypertension    Ischemic cardiomyopathy 12/10/2020   EF 30 to 35%  with anterior, anterolateral and inferior hypokinesis to akinesis consistent with LAD infarct.    Tobacco Use: Social History   Tobacco Use  Smoking Status Never  Smokeless Tobacco Never    Labs: Recent Review Flowsheet Data     Labs for ITP Cardiac and Pulmonary Rehab Latest Ref Rng & Units 12/08/2020 01/21/2021   Cholestrol 100 - 199 mg/dL 218(H) 118   LDLCALC 0 - 99 mg/dL 135(H) 45   HDL >39 mg/dL 56 53   Trlycerides 0 - 149 mg/dL 135 111   Hemoglobin A1c 4.8 - 5.6 % 5.6 -       Capillary Blood Glucose: No results found for: GLUCAP   Exercise Target Goals: Exercise Program Goal: Individual exercise prescription set using results from initial 6 min walk test and THRR while considering  patients activity barriers and safety.   Exercise Prescription Goal: Starting with aerobic activity 30 plus minutes a day, 3 days per week for initial exercise prescription. Provide home exercise prescription and guidelines that participant acknowledges understanding prior to discharge.  Activity Barriers & Risk Stratification:  Activity Barriers & Cardiac Risk Stratification - 02/27/21 0830       Activity Barriers & Cardiac Risk Stratification   Activity Barriers Arthritis;Neck/Spine Problems;Joint Problems;Muscular Weakness;History of Falls    Cardiac Risk Stratification High             6 Minute Walk:  6 Minute Walk     Row Name 02/27/21 1049         6 Minute Walk   Phase Initial     Distance 1200 feet     Walk Time 6 minutes     # of Rest Breaks 0     MPH 2.3     METS 2.58     RPE 11     VO2 Peak 8.83     Symptoms No     Resting HR 83 bpm     Resting BP 98/60     Resting Oxygen Saturation  98 %     Exercise Oxygen Saturation  during 6 min walk 99 %     Max Ex. HR 94 bpm     Max Ex. BP 115/65     2 Minute Post BP 96/60              Oxygen Initial Assessment:   Oxygen Re-Evaluation:   Oxygen Discharge (Final Oxygen Re-Evaluation):   Initial  Exercise Prescription:  Initial Exercise Prescription - 02/27/21 1000       Date of Initial Exercise RX and Referring Provider   Date 02/27/21    Referring Provider Dr. Ellyn Hack    Expected Discharge Date 05/23/21      Treadmill   MPH 1.5    Grade 0    Minutes 17  NuStep   Level 1    SPM 60    Minutes 22      Prescription Details   Frequency (times per week) 3    Duration Progress to 30 minutes of continuous aerobic without signs/symptoms of physical distress      Intensity   THRR 40-80% of Max Heartrate 62-123    Ratings of Perceived Exertion 11-13    Perceived Dyspnea 0-4      Resistance Training   Training Prescription Yes    Weight 3    Reps 10-15             Perform Capillary Blood Glucose checks as needed.  Exercise Prescription Changes:   Exercise Prescription Changes     Row Name 03/03/21 1200 03/17/21 1150 03/28/21 1100         Response to Exercise   Blood Pressure (Admit) 96/60 -- 99/60     Blood Pressure (Exercise) 120/62 140/65 110/62     Blood Pressure (Exit) 94/58 118/70 92/60     Heart Rate (Admit) 88 bpm 80 bpm 72 bpm     Heart Rate (Exercise) 110 bpm 106 bpm 109 bpm     Heart Rate (Exit) 97 bpm 89 bpm 81 bpm     Rating of Perceived Exertion (Exercise) 13 11 11      Duration Continue with 30 min of aerobic exercise without signs/symptoms of physical distress. Continue with 30 min of aerobic exercise without signs/symptoms of physical distress. Continue with 30 min of aerobic exercise without signs/symptoms of physical distress.     Intensity THRR unchanged THRR unchanged THRR unchanged       Progression   Progression Continue to progress workloads to maintain intensity without signs/symptoms of physical distress. Continue to progress workloads to maintain intensity without signs/symptoms of physical distress. Continue to progress workloads to maintain intensity without signs/symptoms of physical distress.       Resistance Training    Training Prescription Yes Yes Yes     Weight 3 3 4      Reps 10-15 10-15 10-15     Time 10 Minutes 10 Minutes 10 Minutes       Treadmill   MPH 1.9 1.8 1.7     Grade 0 0 2     Minutes 17 17 17      METs 2.45 2.38 2.77       NuStep   Level 1 1 2      SPM 78 77 95     Minutes 22 22 22      METs 1.91 1.86 2.24              Exercise Comments:   Exercise Goals and Review:   Exercise Goals     Row Name 02/27/21 1052 03/18/21 0812 04/15/21 1424         Exercise Goals   Increase Physical Activity Yes Yes Yes     Intervention Provide advice, education, support and counseling about physical activity/exercise needs.;Develop an individualized exercise prescription for aerobic and resistive training based on initial evaluation findings, risk stratification, comorbidities and participant's personal goals. Provide advice, education, support and counseling about physical activity/exercise needs.;Develop an individualized exercise prescription for aerobic and resistive training based on initial evaluation findings, risk stratification, comorbidities and participant's personal goals. Provide advice, education, support and counseling about physical activity/exercise needs.;Develop an individualized exercise prescription for aerobic and resistive training based on initial evaluation findings, risk stratification, comorbidities and participant's personal goals.     Expected Outcomes Short  Term: Attend rehab on a regular basis to increase amount of physical activity.;Long Term: Add in home exercise to make exercise part of routine and to increase amount of physical activity.;Long Term: Exercising regularly at least 3-5 days a week. Short Term: Attend rehab on a regular basis to increase amount of physical activity.;Long Term: Add in home exercise to make exercise part of routine and to increase amount of physical activity.;Long Term: Exercising regularly at least 3-5 days a week. Short Term: Attend rehab  on a regular basis to increase amount of physical activity.;Long Term: Add in home exercise to make exercise part of routine and to increase amount of physical activity.;Long Term: Exercising regularly at least 3-5 days a week.     Increase Strength and Stamina Yes Yes Yes     Intervention Provide advice, education, support and counseling about physical activity/exercise needs.;Develop an individualized exercise prescription for aerobic and resistive training based on initial evaluation findings, risk stratification, comorbidities and participant's personal goals. Provide advice, education, support and counseling about physical activity/exercise needs.;Develop an individualized exercise prescription for aerobic and resistive training based on initial evaluation findings, risk stratification, comorbidities and participant's personal goals. Provide advice, education, support and counseling about physical activity/exercise needs.;Develop an individualized exercise prescription for aerobic and resistive training based on initial evaluation findings, risk stratification, comorbidities and participant's personal goals.     Expected Outcomes Short Term: Increase workloads from initial exercise prescription for resistance, speed, and METs.;Short Term: Perform resistance training exercises routinely during rehab and add in resistance training at home;Long Term: Improve cardiorespiratory fitness, muscular endurance and strength as measured by increased METs and functional capacity (6MWT) Short Term: Increase workloads from initial exercise prescription for resistance, speed, and METs.;Short Term: Perform resistance training exercises routinely during rehab and add in resistance training at home;Long Term: Improve cardiorespiratory fitness, muscular endurance and strength as measured by increased METs and functional capacity (6MWT) Short Term: Increase workloads from initial exercise prescription for resistance, speed, and  METs.;Short Term: Perform resistance training exercises routinely during rehab and add in resistance training at home;Long Term: Improve cardiorespiratory fitness, muscular endurance and strength as measured by increased METs and functional capacity (6MWT)     Able to understand and use rate of perceived exertion (RPE) scale Yes Yes Yes     Intervention Provide education and explanation on how to use RPE scale Provide education and explanation on how to use RPE scale Provide education and explanation on how to use RPE scale     Expected Outcomes Short Term: Able to use RPE daily in rehab to express subjective intensity level;Long Term:  Able to use RPE to guide intensity level when exercising independently Short Term: Able to use RPE daily in rehab to express subjective intensity level;Long Term:  Able to use RPE to guide intensity level when exercising independently Short Term: Able to use RPE daily in rehab to express subjective intensity level;Long Term:  Able to use RPE to guide intensity level when exercising independently     Knowledge and understanding of Target Heart Rate Range (THRR) Yes Yes Yes     Intervention Provide education and explanation of THRR including how the numbers were predicted and where they are located for reference Provide education and explanation of THRR including how the numbers were predicted and where they are located for reference Provide education and explanation of THRR including how the numbers were predicted and where they are located for reference     Expected Outcomes Short Term:  Able to state/look up THRR;Short Term: Able to use daily as guideline for intensity in rehab;Long Term: Able to use THRR to govern intensity when exercising independently Short Term: Able to state/look up THRR;Short Term: Able to use daily as guideline for intensity in rehab;Long Term: Able to use THRR to govern intensity when exercising independently Short Term: Able to state/look up THRR;Short  Term: Able to use daily as guideline for intensity in rehab;Long Term: Able to use THRR to govern intensity when exercising independently     Able to check pulse independently Yes Yes Yes     Intervention Provide education and demonstration on how to check pulse in carotid and radial arteries.;Review the importance of being able to check your own pulse for safety during independent exercise Provide education and demonstration on how to check pulse in carotid and radial arteries.;Review the importance of being able to check your own pulse for safety during independent exercise Provide education and demonstration on how to check pulse in carotid and radial arteries.;Review the importance of being able to check your own pulse for safety during independent exercise     Expected Outcomes Short Term: Able to explain why pulse checking is important during independent exercise;Long Term: Able to check pulse independently and accurately Short Term: Able to explain why pulse checking is important during independent exercise;Long Term: Able to check pulse independently and accurately Short Term: Able to explain why pulse checking is important during independent exercise;Long Term: Able to check pulse independently and accurately     Understanding of Exercise Prescription Yes Yes Yes     Intervention Provide education, explanation, and written materials on patient's individual exercise prescription Provide education, explanation, and written materials on patient's individual exercise prescription Provide education, explanation, and written materials on patient's individual exercise prescription     Expected Outcomes Short Term: Able to explain program exercise prescription;Long Term: Able to explain home exercise prescription to exercise independently Short Term: Able to explain program exercise prescription;Long Term: Able to explain home exercise prescription to exercise independently Short Term: Able to explain program  exercise prescription;Long Term: Able to explain home exercise prescription to exercise independently              Exercise Goals Re-Evaluation :  Exercise Goals Re-Evaluation     Row Name 03/18/21 6644 04/15/21 1425           Exercise Goal Re-Evaluation   Exercise Goals Review Increase Physical Activity;Increase Strength and Stamina;Able to understand and use rate of perceived exertion (RPE) scale;Knowledge and understanding of Target Heart Rate Range (THRR);Able to check pulse independently;Understanding of Exercise Prescription Increase Physical Activity;Increase Strength and Stamina;Able to understand and use rate of perceived exertion (RPE) scale;Knowledge and understanding of Target Heart Rate Range (THRR);Able to check pulse independently;Understanding of Exercise Prescription      Comments Pt has completed 5 sessions of cardiac rehab. She is motivated during class. She is currently exercising at 2.38 METs on the TM. Will continue to monitor and progress as able. Pt has completed 9 sessions of cardiac rehab. She last attended 03/28/2021. She has had covid since then and has been too sick to come in. She is set to return 04/16/2020. She was exercising at 2.77 METs on the treadmill. Will continue to monitor and progress as able.      Expected Outcomes Through exercise at rehab and at home, the patient will meet their stated goals. Through exercise at rehab and at home, the patient will meet their stated  goals.                Discharge Exercise Prescription (Final Exercise Prescription Changes):  Exercise Prescription Changes - 03/28/21 1100       Response to Exercise   Blood Pressure (Admit) 99/60    Blood Pressure (Exercise) 110/62    Blood Pressure (Exit) 92/60    Heart Rate (Admit) 72 bpm    Heart Rate (Exercise) 109 bpm    Heart Rate (Exit) 81 bpm    Rating of Perceived Exertion (Exercise) 11    Duration Continue with 30 min of aerobic exercise without signs/symptoms of  physical distress.    Intensity THRR unchanged      Progression   Progression Continue to progress workloads to maintain intensity without signs/symptoms of physical distress.      Resistance Training   Training Prescription Yes    Weight 4    Reps 10-15    Time 10 Minutes      Treadmill   MPH 1.7    Grade 2    Minutes 17    METs 2.77      NuStep   Level 2    SPM 95    Minutes 22    METs 2.24             Nutrition:  Target Goals: Understanding of nutrition guidelines, daily intake of sodium 1500mg , cholesterol 200mg , calories 30% from fat and 7% or less from saturated fats, daily to have 5 or more servings of fruits and vegetables.  Biometrics:  Pre Biometrics - 02/27/21 1052       Pre Biometrics   Height 4\' 10"  (1.473 m)    Weight 62.8 kg    Waist Circumference 35.6 inches    Hip Circumference 38 inches    Waist to Hip Ratio 0.94 %    BMI (Calculated) 28.94    Triceps Skinfold 13 mm    % Body Fat 36.8 %    Grip Strength 20.6 kg    Flexibility 13 in    Single Leg Stand 60 seconds              Nutrition Therapy Plan and Nutrition Goals:  Nutrition Therapy & Goals - 02/27/21 0910       Personal Nutrition Goals   Comments Patient scored 13 on her diet assessment. Handout discussed and provided regarding healthier choices. We offer 2 educational sessions on heart healthy nutrition with handouts. She says she follows a low sodium diet no more that 2000 mg/day and does not eat any red meat.      Intervention Plan   Intervention Nutrition handout(s) given to patient.    Expected Outcomes Short Term Goal: Understand basic principles of dietary content, such as calories, fat, sodium, cholesterol and nutrients.             Nutrition Assessments:  Nutrition Assessments - 02/27/21 0910       MEDFICTS Scores   Pre Score 13            MEDIFICTS Score Key: ?70 Need to make dietary changes  40-70 Heart Healthy Diet ? 40 Therapeutic Level  Cholesterol Diet   Picture Your Plate Scores: <75 Unhealthy dietary pattern with much room for improvement. 41-50 Dietary pattern unlikely to meet recommendations for good health and room for improvement. 51-60 More healthful dietary pattern, with some room for improvement.  >60 Healthy dietary pattern, although there may be some specific behaviors that could be improved.  Nutrition Goals Re-Evaluation:   Nutrition Goals Discharge (Final Nutrition Goals Re-Evaluation):   Psychosocial: Target Goals: Acknowledge presence or absence of significant depression and/or stress, maximize coping skills, provide positive support system. Participant is able to verbalize types and ability to use techniques and skills needed for reducing stress and depression.  Initial Review & Psychosocial Screening:  Initial Psych Review & Screening - 02/27/21 0916       Initial Review   Current issues with Current Depression;History of Depression;Current Anxiety/Panic      Family Dynamics   Good Support System? Yes      Barriers   Psychosocial barriers to participate in program The patient should benefit from training in stress management and relaxation.;There are no identifiable barriers or psychosocial needs.      Screening Interventions   Interventions Encouraged to exercise;Provide feedback about the scores to participant;To provide support and resources with identified psychosocial needs    Expected Outcomes Short Term goal: Utilizing psychosocial counselor, staff and physician to assist with identification of specific Stressors or current issues interfering with healing process. Setting desired goal for each stressor or current issue identified.;Short Term goal: Identification and review with participant of any Quality of Life or Depression concerns found by scoring the questionnaire.;Long Term goal: The participant improves quality of Life and PHQ9 Scores as seen by post scores and/or verbalization of  changes             Quality of Life Scores:  Quality of Life - 02/27/21 1053       Quality of Life   Select Quality of Life      Quality of Life Scores   Health/Function Pre 24.81 %    Socioeconomic Pre 24.21 %    Psych/Spiritual Pre 25.5 %    Family Pre 27.6 %    GLOBAL Pre 25.27 %            Scores of 19 and below usually indicate a poorer quality of life in these areas.  A difference of  2-3 points is a clinically meaningful difference.  A difference of 2-3 points in the total score of the Quality of Life Index has been associated with significant improvement in overall quality of life, self-image, physical symptoms, and general health in studies assessing change in quality of life.  PHQ-9: Recent Review Flowsheet Data     Depression screen [facility] 2/9 02/27/2021   Decreased Interest 1   Down, Depressed, Hopeless 1   PHQ - 2 Score 2   Altered sleeping 0   Tired, decreased energy 1   Change in appetite 2   Feeling bad or failure about yourself  1   Trouble concentrating 2   Moving slowly or fidgety/restless 0   Suicidal thoughts 0   PHQ-9 Score 8   Difficult doing work/chores Not difficult at all      Interpretation of Total Score  Total Score Depression Severity:  1-4 = Minimal depression, 5-9 = Mild depression, 10-14 = Moderate depression, 15-19 = Moderately severe depression, 20-27 = Severe depression   Psychosocial Evaluation and Intervention:  Psychosocial Evaluation - 02/27/21 0916       Psychosocial Evaluation & Interventions   Interventions Stress management education;Relaxation education;Encouraged to exercise with the program and follow exercise prescription    Comments Patient has no psychosocial barriers identified to participate in CR. Her initial PHQ-9 score was 8. 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 She  feels her anxiety and depression are managed well with medication.  She has been on treatment longterm. She names her 2 sisters as her emotional support people. She says they are very close. She has one daugther that she is also very close with who just recently got married. She has a person she names as her signficiant other. She says they have been together for 18 years but she primarily lives alone. She demonstrates a strong interest in doing the program and improving her health overall. 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 Her cardiologist wanted her to wear the vest until repeat ECHO 03/24/21 and would discontinue if EF if at least 35%. She has not worn the vest since 11/3 due to skin breakdown on her back. She was told we would need clearence from her cardiologist before we proceed with any exercise without the vest. She agreed to wear the vest while in cardiac rehab. She is very anxious to start the program hoping this will imrpove her health overall and get herself and her heart stronger. She also wants to learn what she can and can't do and how hard she can push herself.    Expected Outcomes Patient will continue to have no psychosocial barriers identified.    Continue Psychosocial Services  No Follow up required             Psychosocial Re-Evaluation:  Psychosocial Re-Evaluation     [place] Name 03/12/21 1307 04/03/21 1505           Psychosocial Re-Evaluation   Current issues with Current Depression;History of Depression;Current Anxiety/Panic Current Depression;History of Depression;Current Anxiety/Panic      Comments Patient is new to the program completing 2 sessions with current psychosocial issues of depression and GAD which are managed with Alprazolam and Effexor and Trazadone for sleep. Patient feels her issues are managed with medication. We will continue to monitor. Patient has completed 9 sessions with current psychosocial issues of depression and GAD continue to be managed with Alprazolam and Effexor and  Trazadone for sleep. Patient continues to feel like her issues are managed with medication. She continues to have no psychosocial barriers identified. We will continue to monitor.      Expected Outcomes Patient psychosocial issues will continue to be managed with medication. Patient psychosocial issues will continue to be managed with medication with no barriers identified.      Interventions Stress management education;Encouraged to attend Cardiac Rehabilitation for the exercise;Relaxation education Stress management education;Encouraged to attend Cardiac Rehabilitation for the exercise;Relaxation education      Continue Psychosocial Services  Follow up required by staff No Follow up required               Psychosocial Discharge (Final Psychosocial Re-Evaluation):  Psychosocial Re-Evaluation - 04/03/21 1505       Psychosocial Re-Evaluation   Current issues with Current Depression;History of Depression;Current Anxiety/Panic    Comments Patient has completed 9 sessions with current psychosocial issues of depression and GAD continue to be managed with Alprazolam and Effexor and Trazadone for sleep. Patient continues to feel like her issues are managed with medication. She continues to have no psychosocial barriers identified. We will continue to monitor.    Expected Outcomes Patient psychosocial issues will continue to be managed with medication with no barriers identified.    Interventions Stress management education;Encouraged to attend Cardiac Rehabilitation for the exercise;Relaxation education    Continue Psychosocial Services  No  Follow up required             Vocational Rehabilitation: Provide vocational rehab assistance to qualifying candidates.   Vocational Rehab Evaluation & Intervention:  Vocational Rehab - 02/27/21 0911       Initial Vocational Rehab Evaluation & Intervention   Assessment shows need for Vocational Rehabilitation No      Vocational Rehab Re-Evaulation    Comments Patient is retired and does not need vocational rehab.             Education: Education Goals: Education classes will be provided on a weekly basis, covering required topics. Participant will state understanding/return demonstration of topics presented.  Learning Barriers/Preferences:  Learning Barriers/Preferences - 02/27/21 0911       Learning Barriers/Preferences   Learning Barriers None    Learning Preferences Verbal Instruction;Audio;Written Material;Skilled Demonstration             Education Topics: Hypertension, Hypertension Reduction -Define heart disease and high blood pressure. Discus how high blood pressure affects the body and ways to reduce high blood pressure.   Exercise and Your Heart -Discuss why it is important to exercise, the FITT principles of exercise, normal and abnormal responses to exercise, and how to exercise safely.   Angina -Discuss definition of angina, causes of angina, treatment of angina, and how to decrease risk of having angina.   Cardiac Medications -Review what the following cardiac medications are used for, how they affect the body, and side effects that may occur when taking the medications.  Medications include Aspirin, Beta blockers, calcium channel blockers, ACE Inhibitors, angiotensin receptor blockers, diuretics, digoxin, and antihyperlipidemics.   Congestive Heart Failure -Discuss the definition of CHF, how to live with CHF, the signs and symptoms of CHF, and how keep track of weight and sodium intake.   Heart Disease and Intimacy -Discus the effect sexual activity has on the heart, how changes occur during intimacy as we age, and safety during sexual activity.   Smoking Cessation / COPD -Discuss different methods to quit smoking, the health benefits of quitting smoking, and the definition of COPD.   Nutrition I: Fats -Discuss the types of cholesterol, what cholesterol does to the heart, and how  cholesterol levels can be controlled. Flowsheet Row CARDIAC REHAB PHASE II EXERCISE from 03/26/2021 in [place]  Date 03/05/21  Educator DF  Instruction Review Code 1- Verbalizes Understanding       Nutrition II: Labels -Discuss the different components of food labels and how to read food label [place] from 03/26/2021 in [place]  Date 03/19/21  Educator [place]  Instruction Review Code 2- Demonstrated Understanding       Heart Parts/Heart Disease and PAD -Discuss the anatomy of the heart, the pathway of blood circulation through the heart, and these are affected by heart disease. Flowsheet Row CARDIAC REHAB PHASE II EXERCISE from 03/26/2021 in [place]  Date 03/26/21  Educator pb  Instruction Review Code 1- Verbalizes Understanding       Stress I: Signs and Symptoms -Discuss the causes of stress, how stress may lead to anxiety and depression, and ways to limit stress.   Stress II: Relaxation -Discuss different types of relaxation techniques to limit stress.   Warning Signs of Stroke / TIA -Discuss definition of a stroke, what the signs and symptoms are of a stroke, and how to identify when someone is having stroke.   Knowledge Questionnaire Score:  Knowledge Questionnaire Score - 02/27/21 1011       Knowledge Questionnaire Score   Pre Score 24/28             Core Components/Risk Factors/Patient Goals at Admission:  Personal Goals and Risk Factors at Admission - 02/27/21 0912       Core Components/Risk Factors/Patient Goals on Admission    Weight Management Weight Maintenance    Lipids Yes    Intervention Provide education and support for participant on nutrition & aerobic/resistive exercise along with prescribed medications to achieve LDL 70mg , HDL >40mg .    Expected Outcomes Short Term: Participant states understanding of desired cholesterol  values and is compliant with medications prescribed. Participant is following exercise prescription and nutrition guidelines.;Long Term: Cholesterol controlled with medications as prescribed, with individualized exercise RX and with personalized nutrition plan. Value goals: LDL < 70mg , HDL > 40 mg.    Personal Goal Other Yes    Personal Goal Patient wants to get both herself stronger and her heart stronger. She want to learn what she can do and how far to push herself. She want to get healthier overall.    Intervention Patient will attend CR 3 days/week and supplement with exercise 2 days/week.    Expected Outcomes Patient will complete the program meeting both personal and program goals.             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 She is new to the program completing 2 sessions. She continues to wear a life vest for low EF with a repeat echo scheduled in December. Her personal goals for the program are to get healthier; get her heart stronger; learn how far to push herself. We will continue to monitor her progress as she works towards meeting these goals. Patient has completed 9 sessions. Her initial weight was 138.0 lbs and her current weight is 138.0 lbs. She is doing well in the program with progressions and consistent attendance. She is currently out with COVID with mild symptoms waiting for treatment from PCP. 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 Her EF is back to normal at 55 to 60% and her life vest was discontinued. Patient is very happy about this. She has a follow up OV with Dr. Ellyn Hack 04/22/21. Her blood pressue is well controlled. Her personal goals continue to be to get healthier; get her heart  stronger; and learn how far to push herself. We will continue to monitor her progress as she works towards meeting these goals.      Expected Outcomes Patient will complete the program meeting both program and personal goals. Patient will complete the program meeting both program and personal goals.               Core Components/Risk Factors/Patient Goals at Discharge (Final Review):   Goals and Risk Factor Review - 04/03/21 1507       Core Components/Risk Factors/Patient Goals Review   Personal Goals Review Lipids;Other    Review Patient has completed 9 sessions. Her initial weight was 138.0 lbs and her current weight is 138.0 lbs. She is doing well in the program with progressions and consistent attendance.  She is currently out with COVID with mild symptoms waiting for treatment from PCP. 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 Her EF is back to normal at 55 to 60% and her life vest was discontinued. Patient is very happy about this. She has a follow up OV with Dr. Ellyn Hack 04/22/21. Her blood pressue is well controlled. Her personal goals continue to be to get healthier; get her heart stronger; and learn how far to push herself. We will continue to monitor her progress as she works towards meeting these goals.    Expected Outcomes Patient will complete the program meeting both program and personal goals.             ITP Comments:   Comments: ITP REVIEW Pt is making expected progress toward Cardiac Rehab goals after completing 9 sessions. Recommend continued exercise, life style modification, education, and increased stamina and strength.

## 2021-04-16 NOTE — Progress Notes (Signed)
Daily Session Note  Patient Details  Name: Amber Peterson MRN: [id number] Date of Birth: [birth year] Referring Provider:   Flowsheet Row CARDIAC REHAB PHASE II ORIENTATION from 02/27/2021 in [place]  Referring Provider Dr. Ellyn Hack       Encounter Date: 04/16/2021  Check In:  Session Check In - 04/16/21 1100       Check-In   Supervising physician immediately available to respond to emergencies [facility] MD immediately available    Physician(s) Dr. Harl Bowie    Location [facility]    Staff Present Hoy Register, MS, ACSM-CEP, Exercise Physiologist;Heather Zigmund Daniel, Exercise Physiologist;Other    Virtual Visit No    Medication changes reported     No    Fall or balance concerns reported    No    Tobacco Cessation No Change    Warm-up and Cool-down Performed as group-led instruction    Resistance Training Performed Yes    VAD Patient? No    PAD/SET Patient? No      Pain Assessment   Currently in Pain? No/denies    Pain Score 0-No pain    Multiple Pain Sites No             Capillary Blood Glucose: No results found for this or any previous visit (from the past 24 hour(s)).    Social History   Tobacco Use  Smoking Status Never  Smokeless Tobacco Never    Goals Met:  Independence with exercise equipment Exercise tolerated well No report of concerns or symptoms today Strength training completed today  Goals Unmet:  Not Applicable  Comments: checkout time is 1200   Dr. Kathie Dike is Medical Director for [facility].

## 2021-04-18 ENCOUNTER — Encounter (HOSPITAL_COMMUNITY)
Admission: RE | Admit: 2021-04-18 | Discharge: 2021-04-18 | Disposition: A | Discharge status: Home / Self Care | Payer: Medicare Other | Source: Ambulatory Visit | Attending: Cardiovascular Disease | Admitting: Cardiovascular Disease

## 2021-04-18 DIAGNOSIS — Z955 Presence of coronary angioplasty implant and graft: Secondary | ICD-10-CM

## 2021-04-18 DIAGNOSIS — I213 ST elevation (STEMI) myocardial infarction of unspecified site: Secondary | ICD-10-CM

## 2021-04-18 NOTE — Progress Notes (Signed)
Daily Session Note  Patient Details  Name: Amber Peterson MRN: [id number] Date of Birth: Nov 26, 1955 Referring Provider:   Flowsheet Row CARDIAC REHAB PHASE II ORIENTATION from 02/27/2021 in [place]  Referring Provider Dr. Ellyn Hack       Encounter Date: 04/18/2021  Check In:  Session Check In - 04/18/21 1100       Check-In   Supervising physician immediately available to respond to emergencies [facility] MD immediately available    Physician(s) Dr. Alda Lea    Location [facility]    Staff Present Geanie Cooley, RN;Dalton Kris Mouton, MS, ACSM-CEP, Exercise Physiologist;Debra Wynetta Emery, RN, BSN    Virtual Visit No    Medication changes reported     No    Fall or balance concerns reported    No    Tobacco Cessation No Change    Warm-up and Cool-down Performed as group-led instruction    Resistance Training Performed Yes    VAD Patient? No    PAD/SET Patient? No      Pain Assessment   Currently in Pain? No/denies    Pain Score 0-No pain    Multiple Pain Sites No             Capillary Blood Glucose: No results found for this or any previous visit (from the past 24 hour(s)).    Social History   Tobacco Use  Smoking Status Never  Smokeless Tobacco Never    Goals Met:  Independence with exercise equipment Exercise tolerated well No report of concerns or symptoms today Strength training completed today  Goals Unmet:  Not Applicable  Comments: check out @ 12:00pm   Dr. Kathie Dike is Medical Director for [facility].

## 2021-04-21 ENCOUNTER — Encounter (HOSPITAL_COMMUNITY)
Admission: RE | Admit: 2021-04-21 | Discharge: 2021-04-21 | Disposition: A | Discharge status: Home / Self Care | Payer: Medicare Other | Source: Ambulatory Visit | Attending: Cardiovascular Disease | Admitting: Cardiovascular Disease

## 2021-04-21 DIAGNOSIS — I213 ST elevation (STEMI) myocardial infarction of unspecified site: Secondary | ICD-10-CM

## 2021-04-21 DIAGNOSIS — Z955 Presence of coronary angioplasty implant and graft: Secondary | ICD-10-CM | POA: Diagnosis not present

## 2021-04-21 NOTE — Progress Notes (Signed)
I have reviewed a Home Exercise Prescription with Amber Peterson . Amber Peterson is not currently exercising at home.  The patient was advised to walk 5 days a week for 30-45 minutes.  Amber Peterson and I discussed how to progress their exercise prescription.  The patient stated that their goals were strengthen her heart and build her strength.  The patient stated that they understand the exercise prescription.  We reviewed exercise guidelines, target heart rate during exercise, RPE Scale, weather conditions, NTG use, endpoints for exercise, warmup and cool down.  Patient is encouraged to come to me with any questions. I will continue to follow up with the patient to assist them with progression and safety.

## 2021-04-21 NOTE — Progress Notes (Signed)
Daily Session Note  Patient Details  Name: Amber Peterson MRN: [id number] Date of Birth: March 15, 1956 Referring Provider:   Flowsheet Row CARDIAC REHAB PHASE II ORIENTATION from 02/27/2021 in [place]  Referring Provider Dr. Ellyn Hack       Encounter Date: 04/21/2021  Check In:  Session Check In - 04/21/21 1100       Check-In   Supervising physician immediately available to respond to emergencies [facility] MD immediately available    Physician(s) Dr. Harrington Challenger    Location [facility]    Staff Present Hoy Register, MS, ACSM-CEP, Exercise Physiologist;Debra Wynetta Emery, RN, BSN;Heather Otho Ket, BS, Exercise Physiologist    Virtual Visit No    Medication changes reported     No    Fall or balance concerns reported    No    Tobacco Cessation No Change    Warm-up and Cool-down Performed as group-led instruction    Resistance Training Performed Yes    VAD Patient? No    PAD/SET Patient? No      Pain Assessment   Currently in Pain? No/denies    Pain Score 0-No pain    Multiple Pain Sites No             Capillary Blood Glucose: No results found for this or any previous visit (from the past 24 hour(s)).    Social History   Tobacco Use  Smoking Status Never  Smokeless Tobacco Never    Goals Met:  Independence with exercise equipment Exercise tolerated well No report of concerns or symptoms today Strength training completed today  Goals Unmet:  Not Applicable  Comments: checkout time is 1200   Dr. Kathie Dike is Medical Director for [facility].

## 2021-04-22 ENCOUNTER — Other Ambulatory Visit: Payer: Self-pay

## 2021-04-22 ENCOUNTER — Ambulatory Visit: Payer: Medicare Other | Admitting: Cardiology

## 2021-04-22 ENCOUNTER — Encounter: Payer: Self-pay | Admitting: Cardiology

## 2021-04-22 VITALS — BP 118/78 | HR 67 | Ht <= 58 in | Wt 134.4 lb

## 2021-04-22 DIAGNOSIS — I2109 ST elevation (STEMI) myocardial infarction involving other coronary artery of anterior wall: Secondary | ICD-10-CM

## 2021-04-22 DIAGNOSIS — I2510 Atherosclerotic heart disease of native coronary artery without angina pectoris: Secondary | ICD-10-CM | POA: Diagnosis not present

## 2021-04-22 DIAGNOSIS — I255 Ischemic cardiomyopathy: Secondary | ICD-10-CM | POA: Diagnosis not present

## 2021-04-22 DIAGNOSIS — E785 Hyperlipidemia, unspecified: Secondary | ICD-10-CM

## 2021-04-22 DIAGNOSIS — R0602 Shortness of breath: Secondary | ICD-10-CM

## 2021-04-22 DIAGNOSIS — I10 Essential (primary) hypertension: Secondary | ICD-10-CM | POA: Diagnosis not present

## 2021-04-22 DIAGNOSIS — Z955 Presence of coronary angioplasty implant and graft: Secondary | ICD-10-CM

## 2021-04-22 DIAGNOSIS — I700 Atherosclerosis of aorta: Secondary | ICD-10-CM | POA: Diagnosis not present

## 2021-04-22 MED ORDER — PRASUGREL HCL 10 MG PO TABS: 10.0000 mg | ORAL_TABLET | Freq: Every day | ORAL | 3 refills | Status: AC

## 2021-04-22 NOTE — Progress Notes (Addendum)
Primary Care Provider: Celene Squibb, MD Cardiologist: Glenetta Hew, MD Electrophysiologist: None  Clinic Note: Chief Complaint  Patient presents with   Follow-up    73-month-second posthospital.  Review echo results.   Coronary Artery Disease    5 months out from anterior STEMI two-vessel PCI.  Happy to hear echo results   Cardiomyopathy    No CHF symptoms..  Resolved by echo.   ===================================  ASSESSMENT/PLAN   Problem List Items Addressed This Visit       Cardiology Problems   ST elevation myocardial infarction (STEMI) of anterior wall, subsequent episode of care [facility]) (Chronic)    72-month post anterior MI with two-vessel PCI.  Notably improved EF.  Still has apical akinesis not unexpected, but suspect only subendocardial infarct with no transmural infarct.  Optimal regimen, although we can probably DC spironolactone. No CHF symptoms.      2 Vessel CAD: s/p DES PCI of LAD & LCx-OM2 - Primary (Chronic)    Doing well. Repeat Echo 12/12 with EF 55-60% with some akinetic and hypokinetic segments-- mostly septal and apical. Mild LVH and Grade I DD. This is much improved from echo obtained during hospitalization. Plan:  -D/c spironolactone -Continue metoprolol succinate and entresto -Continue statin -Consider switching Brilinta to Effient given SOB and cost difficulties -Should ideally be on duel anti-platelet therapy but had reports of dark stools on ASA. Will hold until GI evaluation. Has scheduled appointment in March.  -Repeat fasting lipids prior to next visit   Doing well.  We discussed the concerns about breathing issues, and cost related to Effient.  Also concern about bleeding.  At this point, we will go ahead and switch from Brilinta to Effient 10 mg daily.       Relevant Orders   Lipid panel   Comprehensive metabolic panel   Ischemic cardiomyopathy (Chronic)    Preliminary EF after anterior STEMI was 30 to 35%.   After hospitalization and  optimization medical management with Toprol, spironolactone and Entresto, follow-up echocardiogram shows resolution of cardiomyopathy with EF improved up to 55 to 60% %.  There is still some apical akinesis, but notably improved.  No CHF symptoms. Okay to stop spironolactone.  However, continue Toprol and Entresto along with [place].      Relevant Orders   Comprehensive metabolic panel   Essential hypertension (Chronic)    Blood pressure looks great on current meds.  She is on Entresto and Toprol.  We can stop spironolactone since she does not have a diuretic requirement, and EF has improved.  Plan: Continue current meds, but DC spironolactone      Hyperlipidemia with target LDL less than 70 (Chronic)    Labs as of October showed TC 118 and LDL 45.  Well-controlled on current dose of atorvastatin.    Plan: Continue 40 mg atorvastatin -> reassess lipid panel prior to follow-up.      Relevant Orders   Lipid panel   Comprehensive metabolic panel   Atherosclerosis of aorta (HCC) (Chronic)    Seen on radiographic imaging. Continue to titrate guideline directed medical management of CAD which would also address atherosclerotic disease of aorta.  Glycemic control stable, lipids and blood pressure well managed. Is on beta-blocker, Entresto, statin along with DAPT.        Other   Presence of drug coated stent in LAD coronary artery (Chronic)    We will wait to see what the GI evaluation shows. Continue DAPT for now..   Can potentially hold  aspirin, but would prefer not to hold Thienopyridine. Will drop aspirin at the 1 year mark - December 12, 2021, and convert to Plavix 75 mg to complete ~ 2 yrs.   Having issues with breathing and because related to Brilinta. Will plan to convert from Brilinta to Effient 10 mg daily.        Shortness of breath    Intermittently. Does not limit activities of daily living. Lungs clear on examination and patient with normal breathing effort. Could  be side effect of Brilinta.  Plan: -Switch to Effient. Rx sent to Pharmacy.       ===================================  HPI:    Amber Peterson is a 66 y.o. female with a PMH notable for CAD-Anterior STEMI (LAD PCI, with initial ICM), HTN, and HLD who presents today for follow up on echocardiogram.  Recent Hospitalizations: None since MI in August 2022 Amber Peterson was hospitalized in August 2022 after suffering from an acute MI due to occlusion of distal LAD. She has since had stent placement. Echo during hospitalization showed reduced function of 30-35%. She had a repeat Echo on 12/12 with improved function.   Amber Peterson was last seen on 12/24/20 by Almyra Deforest PA-C. Advised to continue on ASA and Brillinta. Had some joint pains at time which seemed to occur with increase in statin. Her statin was reduced at this time from 80 mg to 40 mg of Atorvastatin.    Reviewed  CV studies:    The following studies were reviewed today: (if available, images/films reviewed: From Epic Chart or Care Everywhere) Echo: 03/24/21: EF 55-60%. LV with regional wall motion abnormalities. Apical septal segments and apex are akinetic. Mid segments are hypokinetic. Mild concentric LVH. Grade 1 DD.   Interval History:   Amber Peterson returns for 67-month follow-up to discuss results of her echocardiogram.   She is feeling well but occasionally has shortness of breath. This happens intermittently. She is still able to do her activities of daily living. She is not having to use her albuterol inhaler more often. She notes cost difficulties with both Entresto and Brilinta as these medications together cost her >$200 a month.   She has not had any further episodes of chest pain or pressure with rest or exertion.  She has not had any heart failure symptoms of PND, orthopnea or edema.  States that she has an appointment with the Gastroenterologist in March as she had some dark stools on Aspirin/Brilinta.   Lipid panel  01/21/21 with LDL 45.  Tolerating moderate dose atorvastatin-dose.  CV Review of Symptoms (Summary) Cardiovascular ROS: no chest pain or dyspnea on exertion positive for - orthopnea and -(Notes some SOB with laying flat- this is new since heart attack; also short-lived spells of dyspnea-feels like anxiety; some dark stools. negative for - chest pain, dyspnea on exertion, edema, irregular heartbeat, palpitations, paroxysmal nocturnal dyspnea, rapid heart rate, shortness of breath, or syncope, dizziness, lightheadedness; TIA/amaurosis fugax, claudication.    REVIEWED OF SYSTEMS   Review of Systems  Constitutional:  Negative for diaphoresis and malaise/fatigue.  HENT:  Negative for nosebleeds.   Respiratory:  Positive for shortness of breath (Per HPI-fleeting spells).   Cardiovascular:  Positive for orthopnea. Negative for chest pain, palpitations and leg swelling.  Gastrointestinal:  Negative for blood in stool and melena (Dark stools, but does not sound consistent with melena).       Notes some dark stools  Genitourinary:  Negative for hematuria.  Neurological:  Negative for  dizziness and weakness.  Psychiatric/Behavioral:  Negative for depression.    I have reviewed and (if needed) personally updated the patient's problem list, medications, allergies, past medical and surgical history, social and family history. PAST MEDICAL HISTORY   Past Medical History:  Diagnosis Date   Acute ST elevation myocardial infarction (STEMI) due to occlusion of distal portion of left anterior descending (LAD) coronary artery (HCC) 12/08/2020   100 % prox-mid LAD - DES PCI (also staged PCI-PTCA of LCx-OM2 (stent)-AVGVCx (PTCA).  Initial EF 30-35% -> Improved to 55-60% on 4 month f/u Echo.   Anxiety    Arthritis    all major joint   Asthma 07/22/2017   Depression    Hepatitis C 07/22/2017   History of kidney stones    Hypertension    Ischemic cardiomyopathy - RESOLVED 12/10/2020   EF 30 to 35% with  anterior, anterolateral and inferior hypokinesis to akinesis consistent with LAD infarct. -> 4 month Post-MI / PCI Echo - EF 55-60%.   ~2 Vessel CORONARY ARTERY DISEASE -  LAD & LCx-OM2 12/08/2020   12/08/2020: 100 % mLAD - DES PCI Onyx Frontier DES 2.5 x 26; 80%-99% bifurcation LCx-OM/AVG Cx (Sttaged PCI)> DES PCI Prox-Mid Cx 25%->OM2 90% -> DES PCI ONYX FRONTIER 2.0 x 15 -> 2.3 mm; PTCA of AVG LCx (thrombotic 99%) -> 1.5 mm Balloon PTCA (through stent) - 20% residual.    PAST SURGICAL HISTORY   Past Surgical History:  Procedure Laterality Date   ABDOMINAL HYSTERECTOMY  1995   CORONARY BALLOON ANGIOPLASTY N/A 12/10/2020   Procedure: CORONARY BALLOON ANGIOPLASTY;  Surgeon: Troy Sine, MD;  Location: [place] [facility];  Service: Cardiovascular;; STAGED Bifurcation LCx-OM2-AVGCx: PTCA of AVG LCx (thrombotic 99%) -> 1.5 mm Balloon PTCA (through stent into OM2) - 20% residual.   CORONARY STENT INTERVENTION N/A 12/08/2020   Procedure: CORONARY STENT INTERVENTION;  Surgeon: Lorretta Harp, MD;  Location: [place] [facility];  Service: Cardiovascular;; Prox-Mid LAD 100% (DES PCI ONYX FRONTIER 2.5 x 26 mm -2.6 mm   CORONARY STENT INTERVENTION N/A 12/10/2020   Procedure: CORONARY STENT INTERVENTION;  Surgeon: Troy Sine, MD;  Location: [place] [facility];  Service: Cardiovascular;; STAGED DES PCI Prox-Mid Cx 25%->OM2 90% -> DES PCI ONYX FRONTIER 2.0 x 15 -> 2.3 mm   CORONARY/GRAFT ACUTE MI REVASCULARIZATION N/A 12/08/2020   Procedure: Coronary/Graft Acute MI Revascularization;  Surgeon: Lorretta Harp, MD;  Location: [place] [facility];  Service: Cardiovascular;  Laterality: N/A;   FRACTURE SURGERY Right 01/27/2019   LEFT HEART CATH N/A 12/10/2020   Procedure: Left Heart Cath;  Surgeon: Troy Sine, MD;  Location: [place] [facility];  Service: Cardiovascular; Catheter spasm of LM on initial Guide placement. -> Smooth ~50% prox LAD (pre-stent), patent stent.  30% Ost LCx, 25% m LCx, Ost  OM2 90% & thrombotic AVG LCx 99% LVEDP 13 mmHg   LEFT HEART CATH AND CORONARY ANGIOGRAPHY N/A 12/08/2020   Procedure: LEFT HEART CATH AND CORONARY ANGIOGRAPHY;  Surgeon: Lorretta Harp, MD;  Location: [place] [facility];  Service: Cardiovascular; (Ant STEMI): Prox RCA 30%. Short LM. Prox-mid LCx 80% (actually into OM2) & AVG LCVx 99%; Prox-Mid LAD 100%  (DES PCI LAD, Staged PCI of LCx)   REVERSE SHOULDER ARTHROPLASTY Right 03/22/2019   Procedure: REVERSE SHOULDER ARTHROPLASTY;  Surgeon: Hiram Gash, MD;  Location: [facility];  Service: Orthopedics;  Laterality: Right;   TONSILLECTOMY  2000   TRANSTHORACIC ECHOCARDIOGRAM  12/09/2020   (  Ant STEMI on 12/08/2020: EF 30 to 35%.  Moderate reduced EF.  Mid-apical anterior, anteroseptal and inferoseptal AK.  Also apical inferior, apical lateral and apical akinesis.  GR 1 DD.  Mild LA dilation.  Normal RV size, function and RVP, RAP.Marland Kitchen  Normal valves.   TRANSTHORACIC ECHOCARDIOGRAM  03/24/2021   (3 months post Anterior STEMI with LAD and LCx PCI w/ initial) EF 30-35%) : EF IMPROVED to 55-60%. LV with regional wall motion abnormalities. Apical septal segments and apex are akinetic. Mid segments are hypokinetic. Mild concentric LVH. Grade 1 DD.   Cardiac Cath-Acute PCI of LAD (12/08/2020): Prox RCA 30%. Short LM. Prox-mid LCx 80% (actually into OM2) & AVG LCVx 99%; Prox-Mid LAD 100%  Prox-Mid LAD 100% (DES PCI ONYX FRONTIER 2.5 x 26 mm -2.6 mm - see staged PCI diagram)  Cardiac Cath - Staged PCI LCx-OM2, PTCA AVG LCx (12/10/2020): Catheter spasm of LM on initial Guide placement. -> Smooth ~50% prox LAD (pre-stent), patent stent.  30% Ost LCx, 25% m LCx, Ost OM2 90% & thrombotic AVG LCx 99% LVEDP 13 mmHg DES PCI Prox-Mid Cx 25%->OM2 90% -> DES PCI ONYX FRONTIER 2.0 x 15 -> 2.3 mm PTCA of AVG LCx (thrombotic 99%) -> 1.5 mm Balloon PTCA (through stent) - 20% residual.     TTE (post Ant STEMI) 12/09/2020: EF 30 to 35%.  Moderate reduced EF.  Mid-apical anterior,  anteroseptal and inferoseptal AK.  Also apical inferior, apical lateral and apical akinesis.  GR 1 DD.  Mild LA dilation.  Normal RV size, function and RVP, RAP.Marland Kitchen  Normal valves.  .  There is no immunization history on file for this patient.  MEDICATIONS/ALLERGIES   Current Meds  Medication Sig   acetaminophen (TYLENOL) 500 MG tablet Take 2,000 mg by mouth 4 (four) times daily as needed for moderate pain.   albuterol (VENTOLIN HFA) 108 (90 Base) MCG/ACT inhaler Inhale 2 puffs into the lungs every 6 (six) hours as needed for wheezing or shortness of breath.   ALPRAZolam (XANAX) 0.5 MG tablet Take 0.5 mg by mouth 2 (two) times daily.   Ascorbic Acid (VITAMIN C PO) Take 1 tablet by mouth daily.   atorvastatin (LIPITOR) 40 MG tablet Take 1 tablet (40 mg total) by mouth daily.   Biotin 1000 MCG tablet Take 1,000 mcg by mouth daily.   budesonide-formoterol (SYMBICORT) 160-4.5 MCG/ACT inhaler Inhale 2 puffs into the lungs 2 (two) times daily.   Cholecalciferol (DIALYVITE VITAMIN D 5000 PO) Take 5,000 Units by mouth daily.   gabapentin (NEURONTIN) 300 MG capsule Take 1 capsule (300 mg total) by mouth 3 (three) times daily. (Patient taking differently: Take 300 mg by mouth 3 (three) times daily as needed (pain).)   HYDROcodone-acetaminophen (NORCO/VICODIN) 5-325 MG tablet Take 1 tablet by mouth every 8 (eight) hours.   hydrocortisone cream 1 % Apply 1 application topically daily as needed for itching.   metoprolol succinate (TOPROL-XL) 25 MG 24 hr tablet Take 1 tablet (25 mg total) by mouth daily.   nitroGLYCERIN (NITROSTAT) 0.4 MG SL tablet Place 1 tablet (0.4 mg total) under the tongue every 5 (five) minutes as needed for chest pain.   prasugrel (EFFIENT) 10 MG TABS tablet Take 1 tablet (10 mg total) by mouth daily. Do NOT take with Brilinta.   sacubitril-valsartan (ENTRESTO) 24-26 MG Take 1 tablet by mouth 2 (two) times daily.   Tetrahydrozoline HCl (VISINE OP) Place 1 drop into both eyes daily as  needed (dry eyes).   thyroid (  ARMOUR) 15 MG tablet Take 15 mg by mouth daily.   traZODone (DESYREL) 50 MG tablet Take 150 mg by mouth at bedtime.   venlafaxine XR (EFFEXOR-XR) 150 MG 24 hr capsule Take 150 mg by mouth daily with breakfast.   vitamin B-12 (CYANOCOBALAMIN) 1000 MCG tablet Take 1,000 mcg by mouth daily.   zinc gluconate 50 MG tablet Take 50 mg by mouth daily.   [DISCONTINUED] spironolactone (ALDACTONE) 25 MG tablet Take 0.5 tablets (12.5 mg total) by mouth daily.   [DISCONTINUED] ticagrelor (BRILINTA) 90 MG TABS tablet Take 1 tablet (90 mg total) by mouth 2 (two) times daily.    Allergies  Allergen Reactions   Shellfish Allergy Anaphylaxis   Other Other (See Comments)    Allergic to hackleberrys per allergy test Walnuts cause asthma/shortness of breath   Penicillins Hives and Itching    Did it involve swelling of the face/tongue/throat, SOB, or low BP? No Did it involve sudden or severe rash/hives, skin peeling, or any reaction on the inside of your mouth or nose? No Did you need to seek medical attention at a hospital or doctor's office? No When did it last happen?      66 years old If all above answers are "NO", may proceed with cephalosporin use.     Sulfa Antibiotics Hives and Itching   Tape     Causes blisters     SOCIAL HISTORY/FAMILY HISTORY   Reviewed in Epic:  Pertinent findings:  Social History   Tobacco Use   Smoking status: Never   Smokeless tobacco: Never  Vaping Use   Vaping Use: Never used  Substance Use Topics   Alcohol use: Not Currently    Comment: rarely   Drug use: Never   Social History   Social History Narrative   Not on file    OBJCTIVE -PE, EKG, labs   Wt Readings from Last 3 Encounters:  04/22/21 134 lb 6.4 oz (61 kg)  03/17/21 137 lb 9.1 oz (62.4 kg)  03/03/21 136 lb 3.9 oz (61.8 kg)    Physical Exam: BP 118/78    Pulse 67    Ht 4\' 10"  (1.473 m)    Wt 134 lb 6.4 oz (61 kg)    SpO2 97%    BMI 28.09 kg/m  Physical  Exam Constitutional:      General: She is not in acute distress.    Appearance: Normal appearance. She is not ill-appearing or diaphoretic.  HENT:     Head: Normocephalic.     Nose: Nose normal.  Eyes:     Conjunctiva/sclera: Conjunctivae normal.  Cardiovascular:     Rate and Rhythm: Normal rate and regular rhythm.     Pulses: Normal pulses.     Heart sounds: No murmur heard. Pulmonary:     Effort: Pulmonary effort is normal. No respiratory distress.     Breath sounds: Normal breath sounds. No wheezing.  Abdominal:     General: Bowel sounds are normal. There is no distension.     Palpations: Abdomen is soft.  Musculoskeletal:     Cervical back: Normal range of motion and neck supple.  Skin:    General: Skin is warm and dry.     Capillary Refill: Capillary refill takes less than 2 seconds.  Neurological:     General: No focal deficit present.     Mental Status: She is alert. Mental status is at baseline.  Psychiatric:        Mood and Affect: Mood normal.  Behavior: Behavior normal.    Recent Labs:    Lab Results  Component Value Date   CHOL 118 01/21/2021   HDL 53 01/21/2021   LDLCALC 45 01/21/2021   TRIG 111 01/21/2021   CHOLHDL 2.2 01/21/2021   Lab Results  Component Value Date   CREATININE 0.92 12/24/2020   BUN 11 12/24/2020   NA 141 12/24/2020   K 4.4 12/24/2020   CL 103 12/24/2020   CO2 24 12/24/2020   CBC Latest Ref Rng & Units 12/18/2020 12/12/2020 12/11/2020  WBC 3.4 - 10.8 x10E3/uL 9.6 6.6 8.2  Hemoglobin 11.1 - 15.9 g/dL 12.5 10.7(L) 10.8(L)  Hematocrit 34.0 - 46.6 % 35.6 29.3(L) 30.0(L)  Platelets 150 - 450 x10E3/uL 222 86(L) 97(L)    Lab Results  Component Value Date   HGBA1C 5.6 12/08/2020   Lab Results  Component Value Date   TSH 0.643 12/09/2020    ==================================================  COVID-19 Education: The signs and symptoms of COVID-19 were discussed with the patient and how to seek care for testing (follow up with  PCP or arrange E-visit).    Time spent with patient by Resident of 18 minutes; Attending time the patient spent in direct patient consultation - 8 min = total MD time 26 min Additional time spent with chart review  / charting (studies, outside notes, etc): 25 min - Cath films & Echo images reviewed & PSH/PMH updated. Total Time: 51 min  Current medicines are reviewed at length with the patient today.   This visit occurred during the SARS-CoV-2 public health emergency.  Safety protocols were in place, including screening questions prior to the visit, additional usage of staff PPE, and extensive cleaning of exam room while observing appropriate contact time as indicated for disinfecting solutions.  Notice: This dictation was prepared with Dragon dictation along with smart phrase technology. Any transcriptional errors that result from this process are unintentional and may not be corrected upon review.  Studies Ordered:   Orders Placed This Encounter  Procedures   Lipid panel   Comprehensive metabolic panel    Patient Instructions / Medication Changes & Studies & Tests Ordered   Patient Instructions  Medication Instructions:    Stop taking Spironolactone   Stop Brilinta  Start Effient  10 mg - one tablet daily    Continue taking Entresto , Metoprolol succinate  *If you need a refill on your cardiac medications before your next appointment, please call your pharmacy*   Lab Work:  Fasting lipid   Cmp If you have labs (blood work) drawn today and your tests are completely normal, you will receive your results only by: MyChart Message (if you have MyChart) OR A paper copy in the mail If you have any lab test that is abnormal or we need to change your treatment, we will call you to review the results.   Testing/Procedures: Not needed   Follow-Up: At [facility], you and your health needs are our priority.  As part of our continuing mission to provide you with exceptional  heart care, we have created designated Provider Care Teams.  These Care Teams include your primary Cardiologist (physician) and Advanced Practice Providers (APPs -  Physician Assistants and Nurse Practitioners) who all work together to provide you with the care you need, when you need it.     Your next appointment:   6 to 7 month(s)   June/July 2023  The format for your next appointment:   In Person  Provider:   Glenetta Hew,  MD      Signed.  Sharion Settler PGY-2 Family Medicine       ATTENDING ATTESTATION  I have seen, examined and evaluated the patient along with the Resident Physician in clinic today.  I personally performed my own interview & exanimation.  After reviewing all the available data and chart, we discussed the patients laboratory, study & physical findings as well as symptoms in detail. I agree with her findings, examination as well as impression recommendations as per our discussion.    Attending adjustments int the full clinic noted annotated in [place].   Doing remarkably well from a CAD/cardiomyopathy/CHF standpoint.  No angina, PND/orthopnea, edema.  I think will be orthopnea symptoms she describing may very well be related to [place].  We will switch from Brilinta to Effient, and follow GI evaluation closely. Stopping prolactinoma continuing beta-blocker and Entresto along with SGLT2 inhibitor.    Glenetta Hew, M.D., M.S. Interventional Cardiologist   Pager # [phone] Phone # [phone] [address] [place], [place] [address]    Thank you for choosing Heartcare at [facility]!!

## 2021-04-22 NOTE — Patient Instructions (Addendum)
Medication Instructions:    Stop taking Spironolactone   Stop Brilinta  Start Effient  10 mg - one tablet daily    Continue taking Entresto , Metoprolol succinate  *If you need a refill on your cardiac medications before your next appointment, please call your pharmacy*   Lab Work:  Fasting lipid   Cmp If you have labs (blood work) drawn today and your tests are completely normal, you will receive your results only by: [place] (if you have MyChart) OR A paper copy in the mail If you have any lab test that is abnormal or we need to change your treatment, we will call you to review the results.   Testing/Procedures: Not needed   Follow-Up: At [facility], you and your health needs are our priority.  As part of our continuing mission to provide you with exceptional heart care, we have created designated Provider Care Teams.  These Care Teams include your primary Cardiologist (physician) and Advanced Practice Providers (APPs -  Physician Assistants and Nurse Practitioners) who all work together to provide you with the care you need, when you need it.     Your next appointment:   6 to 7 month(s)   June/July 2023  The format for your next appointment:   In Person  Provider:   Glenetta Hew, MD

## 2021-04-23 ENCOUNTER — Encounter (HOSPITAL_COMMUNITY)
Admission: RE | Admit: 2021-04-23 | Discharge: 2021-04-23 | Disposition: A | Discharge status: Home / Self Care | Payer: Medicare Other | Source: Ambulatory Visit | Attending: Cardiovascular Disease | Admitting: Cardiovascular Disease

## 2021-04-23 DIAGNOSIS — I213 ST elevation (STEMI) myocardial infarction of unspecified site: Secondary | ICD-10-CM

## 2021-04-23 DIAGNOSIS — Z955 Presence of coronary angioplasty implant and graft: Secondary | ICD-10-CM

## 2021-04-23 NOTE — Progress Notes (Signed)
Daily Session Note  Patient Details  Name: Amber Peterson MRN: [id number] Date of Birth: 1955/05/06 Referring Provider:   Flowsheet Row CARDIAC REHAB PHASE II ORIENTATION from 02/27/2021 in [facility]  Referring Provider Dr. Ellyn Hack       Encounter Date: 04/23/2021  Check In:  Session Check In - 04/23/21 1100       Check-In   Supervising physician immediately available to respond to emergencies [facility] MD immediately available    Physician(s) Gardiner Rhyme    Location [facility]    Staff Present Hoy Register, MS, ACSM-CEP, Exercise Physiologist;Heather Otho Ket, BS, Exercise Physiologist;Yeimy Brabant Wynetta Emery, RN, BSN    Virtual Visit No    Medication changes reported     No    Fall or balance concerns reported    No    Tobacco Cessation No Change    Warm-up and Cool-down Performed as group-led instruction    Resistance Training Performed Yes    VAD Patient? No    PAD/SET Patient? No      Pain Assessment   Currently in Pain? No/denies    Pain Score 0-No pain    Multiple Pain Sites No             Capillary Blood Glucose: No results found for this or any previous visit (from the past 24 hour(s)).    Social History   Tobacco Use  Smoking Status Never  Smokeless Tobacco Never    Goals Met:  Independence with exercise equipment Exercise tolerated well No report of concerns or symptoms today Strength training completed today  Goals Unmet:  Not Applicable  Comments: Check out 1200.   Dr. Kathie Dike is Medical Director for [facility].

## 2021-04-24 ENCOUNTER — Encounter: Payer: Self-pay | Admitting: Cardiology

## 2021-04-24 DIAGNOSIS — I700 Atherosclerosis of aorta: Secondary | ICD-10-CM | POA: Insufficient documentation

## 2021-04-24 DIAGNOSIS — R0602 Shortness of breath: Secondary | ICD-10-CM | POA: Insufficient documentation

## 2021-04-24 NOTE — Assessment & Plan Note (Signed)
Preliminary EF after anterior STEMI was 30 to 35%.   After hospitalization and optimization medical management with Toprol, spironolactone and Entresto, follow-up echocardiogram shows resolution of cardiomyopathy with EF improved up to 55 to 60% %.  There is still some apical akinesis, but notably improved.  No CHF symptoms. Okay to stop spironolactone.  However, continue Toprol and Entresto along with [place].

## 2021-04-24 NOTE — Assessment & Plan Note (Signed)
Seen on radiographic imaging. Continue to titrate guideline directed medical management of CAD which would also address atherosclerotic disease of aorta.  Glycemic control stable, lipids and blood pressure well managed. Is on beta-blocker, Entresto, statin along with DAPT.

## 2021-04-24 NOTE — Assessment & Plan Note (Signed)
Blood pressure looks great on current meds.  She is on Entresto and Toprol.  We can stop spironolactone since she does not have a diuretic requirement, and EF has improved.  Plan: Continue current meds, but DC spironolactone

## 2021-04-24 NOTE — Assessment & Plan Note (Signed)
We will wait to see what the GI evaluation shows.  Continue DAPT for now..    Can potentially hold aspirin, but would prefer not to hold Thienopyridine.  Will drop aspirin at the 1 year mark - December 12, 2021, and convert to Plavix 75 mg to complete ~ 2 yrs.   Having issues with breathing and because related to Brilinta.  Will plan to convert from Brilinta to Effient 10 mg daily.

## 2021-04-24 NOTE — Assessment & Plan Note (Addendum)
Labs as of October showed TC 118 and LDL 45.  Well-controlled on current dose of atorvastatin.    Plan: Continue 40 mg atorvastatin -> reassess lipid panel prior to follow-up.

## 2021-04-24 NOTE — Assessment & Plan Note (Signed)
46-month post anterior MI with two-vessel PCI.  Notably improved EF.  Still has apical akinesis not unexpected, but suspect only subendocardial infarct with no transmural infarct.  Optimal regimen, although we can probably DC spironolactone. No CHF symptoms.

## 2021-04-24 NOTE — Assessment & Plan Note (Signed)
Intermittently. Does not limit activities of daily living. Lungs clear on examination and patient with normal breathing effort. Could be side effect of Brilinta.  Plan: -Switch to Effient. Rx sent to Pharmacy.

## 2021-04-24 NOTE — Progress Notes (Signed)
ATTENDING ATTESTATION  I have seen, examined and evaluated the patient along with the Resident Physician in clinic today.  I personally performed my own interview & exanimation.  After reviewing all the available data and chart, we discussed the patients laboratory, study & physical findings as well as symptoms in detail. I agree with her findings, examination as well as impression recommendations as per our discussion.    Attending adjustments int the full clinic noted annotated in [place].   Doing remarkably well from a CAD/cardiomyopathy/CHF standpoint.  No angina, PND/orthopnea, edema.  I think will be orthopnea symptoms she describing may very well be related to [place].  We will switch from Brilinta to Effient, and follow GI evaluation closely. Stopping prolactinoma continuing beta-blocker and Entresto along with SGLT2 inhibitor.  ST elevation myocardial infarction (STEMI) of anterior wall, subsequent episode of care [facility]) 61-month post anterior MI with two-vessel PCI.  Notably improved EF.  Still has apical akinesis not unexpected, but suspect only subendocardial infarct with no transmural infarct.  Optimal regimen, although we can probably DC spironolactone. No CHF symptoms.  2 Vessel CAD: s/p DES PCI of LAD & LCx-OM2 Doing well. Repeat Echo 12/12 with EF 55-60% with some akinetic and hypokinetic segments-- mostly septal and apical. Mild LVH and Grade I DD. This is much improved from echo obtained during hospitalization. Plan:  -D/c spironolactone -Continue metoprolol succinate and entresto -Continue statin -Consider switching Brilinta to Effient given SOB and cost difficulties -Should ideally be on duel anti-platelet therapy but had reports of dark stools on ASA. Will hold until GI evaluation. Has scheduled appointment in March.  -Repeat fasting lipids prior to next visit   Doing well.  We discussed the concerns about breathing issues, and cost related to Effient.  Also  concern about bleeding.  At this point, we will go ahead and switch from Brilinta to Effient 10 mg daily.   Presence of drug coated stent in LAD coronary artery We will wait to see what the GI evaluation shows. Continue DAPT for now..   Can potentially hold aspirin, but would prefer not to hold Thienopyridine. Will drop aspirin at the 1 year mark - December 12, 2021, and convert to Plavix 75 mg to complete ~ 2 yrs.   Having issues with breathing and because related to Brilinta. Will plan to convert from Brilinta to Effient 10 mg daily.    Atherosclerosis of aorta (HCC) Seen on radiographic imaging. Continue to titrate guideline directed medical management of CAD which would also address atherosclerotic disease of aorta.  Glycemic control stable, lipids and blood pressure well managed. Is on beta-blocker, Entresto, statin along with DAPT.  Shortness of breath Intermittently. Does not limit activities of daily living. Lungs clear on examination and patient with normal breathing effort. Could be side effect of Brilinta.  Plan: -Switch to Effient. Rx sent to Pharmacy.   Essential hypertension Blood pressure looks great on current meds.  She is on Entresto and Toprol.  We can stop spironolactone since she does not have a diuretic requirement, and EF has improved.  Plan: Continue current meds, but DC spironolactone  Hyperlipidemia with target LDL less than 70 Labs as of October showed TC 118 and LDL 45.  Well-controlled on current dose of atorvastatin.    Plan: Continue 40 mg atorvastatin -> reassess lipid panel prior to follow-up.  Ischemic cardiomyopathy Preliminary EF after anterior STEMI was 30 to 35%.   After hospitalization and optimization medical management with Toprol, spironolactone and Entresto,  follow-up echocardiogram shows resolution of cardiomyopathy with EF improved up to 55 to 60% %.  There is still some apical akinesis, but notably improved.  No CHF symptoms. Okay  to stop spironolactone.  However, continue Toprol and Entresto along with [place].    Amber Peterson, M.D., M.S. Interventional Cardiologist   Pager # [phone] Phone # [phone] [address] [place], [place] [address]

## 2021-04-24 NOTE — Assessment & Plan Note (Addendum)
Doing well. Repeat Echo 12/12 with EF 55-60% with some akinetic and hypokinetic segments-- mostly septal and apical. Mild LVH and Grade I DD. This is much improved from echo obtained during hospitalization. Plan:  -D/c spironolactone -Continue metoprolol succinate and entresto -Continue statin -Consider switching Brilinta to Effient given SOB and cost difficulties -Should ideally be on duel anti-platelet therapy but had reports of dark stools on ASA. Will hold until GI evaluation. Has scheduled appointment in March.  -Repeat fasting lipids prior to next visit   Doing well.  We discussed the concerns about breathing issues, and cost related to Effient.  Also concern about bleeding.  At this point, we will go ahead and switch from Brilinta to Effient 10 mg daily.

## 2021-04-25 ENCOUNTER — Encounter (HOSPITAL_COMMUNITY)
Admission: RE | Admit: 2021-04-25 | Discharge: 2021-04-25 | Disposition: A | Discharge status: Home / Self Care | Payer: Medicare Other | Source: Ambulatory Visit | Attending: Cardiovascular Disease | Admitting: Cardiovascular Disease

## 2021-04-25 DIAGNOSIS — Z955 Presence of coronary angioplasty implant and graft: Secondary | ICD-10-CM | POA: Diagnosis not present

## 2021-04-25 DIAGNOSIS — I213 ST elevation (STEMI) myocardial infarction of unspecified site: Secondary | ICD-10-CM

## 2021-04-25 NOTE — Progress Notes (Signed)
Daily Session Note  Patient Details  Name: Amber Peterson MRN: [id number] Date of Birth: Apr 27, 1955 Referring Provider:   Flowsheet Row CARDIAC REHAB PHASE II ORIENTATION from 02/27/2021 in [place]  Referring Provider Dr. Ellyn Hack       Encounter Date: 04/25/2021  Check In:  Session Check In - 04/25/21 1100       Check-In   Supervising physician immediately available to respond to emergencies [facility] MD immediately available    Physician(s) Dr. Domenic Polite    Location [facility]    Staff Present Hoy Register, MS, ACSM-CEP, Exercise Physiologist;Heather Otho Ket, BS, Exercise Physiologist;Kiran Lapine Wynetta Emery, RN, BSN    Virtual Visit No    Medication changes reported     No    Fall or balance concerns reported    No    Tobacco Cessation No Change    Warm-up and Cool-down Performed as group-led instruction    Resistance Training Performed Yes    VAD Patient? No    PAD/SET Patient? No      Pain Assessment   Currently in Pain? No/denies    Pain Score 0-No pain    Multiple Pain Sites No             Capillary Blood Glucose: No results found for this or any previous visit (from the past 24 hour(s)).    Social History   Tobacco Use  Smoking Status Never  Smokeless Tobacco Never    Goals Met:  Independence with exercise equipment Exercise tolerated well No report of concerns or symptoms today Strength training completed today  Goals Unmet:  Not Applicable  Comments: Check out 1200.   Dr. Kathie Dike is Medical Director for [facility] Pulmonary Rehab.

## 2021-04-28 ENCOUNTER — Encounter (HOSPITAL_COMMUNITY)
Admission: RE | Admit: 2021-04-28 | Discharge: 2021-04-28 | Disposition: A | Discharge status: Home / Self Care | Payer: Medicare Other | Source: Ambulatory Visit | Attending: Cardiovascular Disease | Admitting: Cardiovascular Disease

## 2021-04-28 VITALS — Wt 132.7 lb

## 2021-04-28 DIAGNOSIS — Z955 Presence of coronary angioplasty implant and graft: Secondary | ICD-10-CM | POA: Diagnosis not present

## 2021-04-28 DIAGNOSIS — I213 ST elevation (STEMI) myocardial infarction of unspecified site: Secondary | ICD-10-CM

## 2021-04-28 NOTE — Progress Notes (Signed)
Daily Session Note  Patient Details  Name: Amber Peterson MRN: [id number] Date of Birth: [birth year] Referring Provider:   Flowsheet Row CARDIAC REHAB PHASE II ORIENTATION from 02/27/2021 in [place]  Referring Provider Dr. Ellyn Hack       Encounter Date: 04/28/2021  Check In:  Session Check In - 04/28/21 1100       Check-In   Supervising physician immediately available to respond to emergencies [facility] MD immediately available    Physician(s) Branch    Location [facility]    Staff Present Hoy Register, MS, ACSM-CEP, Exercise Physiologist;Heather Otho Ket, BS, Exercise Physiologist;Fortino Haag Wynetta Emery, RN, BSN    Virtual Visit No    Medication changes reported     No    Fall or balance concerns reported    No    Tobacco Cessation No Change    Warm-up and Cool-down Performed as group-led instruction    Resistance Training Performed Yes    VAD Patient? No    PAD/SET Patient? No      Pain Assessment   Currently in Pain? No/denies    Pain Score 0-No pain    Multiple Pain Sites No             Capillary Blood Glucose: No results found for this or any previous visit (from the past 24 hour(s)).    Social History   Tobacco Use  Smoking Status Never  Smokeless Tobacco Never    Goals Met:  Independence with exercise equipment Exercise tolerated well No report of concerns or symptoms today Strength training completed today  Goals Unmet:  Not Applicable  Comments: Check out 1200.   Dr. Kathie Dike is Medical Director for [facility] Pulmonary Rehab.

## 2021-04-30 ENCOUNTER — Encounter (HOSPITAL_COMMUNITY)
Admission: RE | Admit: 2021-04-30 | Discharge: 2021-04-30 | Disposition: A | Discharge status: Home / Self Care | Payer: Medicare Other | Source: Ambulatory Visit | Attending: Cardiovascular Disease | Admitting: Cardiovascular Disease

## 2021-04-30 DIAGNOSIS — Z955 Presence of coronary angioplasty implant and graft: Secondary | ICD-10-CM | POA: Diagnosis not present

## 2021-04-30 DIAGNOSIS — I213 ST elevation (STEMI) myocardial infarction of unspecified site: Secondary | ICD-10-CM | POA: Diagnosis not present

## 2021-04-30 NOTE — Progress Notes (Signed)
Daily Session Note  Patient Details  Name: Amber Peterson MRN: [id number] Date of Birth: [birth year] Referring Provider:   Flowsheet Row CARDIAC REHAB PHASE II ORIENTATION from 02/27/2021 in [place]  Referring Provider Dr. Ellyn Hack       Encounter Date: 04/30/2021  Check In:  Session Check In - 04/30/21 1100       Check-In   Supervising physician immediately available to respond to emergencies [facility] MD immediately available    Physician(s) Dr. Gardiner Rhyme    Location [facility]    Staff Present Redge Gainer, BS, Exercise Physiologist;Debra Wynetta Emery, RN, BSN;Other    Virtual Visit No    Medication changes reported     No    Fall or balance concerns reported    No    Tobacco Cessation No Change    Warm-up and Cool-down Performed as group-led instruction    Resistance Training Performed Yes    VAD Patient? No    PAD/SET Patient? No      Pain Assessment   Currently in Pain? No/denies    Pain Score 0-No pain    Multiple Pain Sites No             Capillary Blood Glucose: No results found for this or any previous visit (from the past 24 hour(s)).    Social History   Tobacco Use  Smoking Status Never  Smokeless Tobacco Never    Goals Met:  Independence with exercise equipment Exercise tolerated well No report of concerns or symptoms today Strength training completed today  Goals Unmet:  Not Applicable  Comments: check out 1200   Dr. Kathie Dike is Medical Director for [facility] Pulmonary Rehab.

## 2021-05-02 ENCOUNTER — Encounter (HOSPITAL_COMMUNITY)
Admission: RE | Admit: 2021-05-02 | Discharge: 2021-05-02 | Disposition: A | Discharge status: Home / Self Care | Payer: Medicare Other | Source: Ambulatory Visit | Attending: Cardiovascular Disease | Admitting: Cardiovascular Disease

## 2021-05-02 DIAGNOSIS — Z955 Presence of coronary angioplasty implant and graft: Secondary | ICD-10-CM | POA: Diagnosis not present

## 2021-05-02 DIAGNOSIS — I213 ST elevation (STEMI) myocardial infarction of unspecified site: Secondary | ICD-10-CM

## 2021-05-02 NOTE — Progress Notes (Signed)
Daily Session Note  Patient Details  Name: Amber Peterson MRN: [id number] Date of Birth: 02-14-1956 Referring Provider:   Flowsheet Row CARDIAC REHAB PHASE II ORIENTATION from 02/27/2021 in [place]  Referring Provider Dr. Ellyn Hack       Encounter Date: 05/02/2021  Check In:  Session Check In - 05/02/21 1100       Check-In   Supervising physician immediately available to respond to emergencies [facility] MD immediately available    Physician(s) Dr. Audie Box    Location [facility]    Staff Present Geanie Cooley, RN;Heather Otho Ket, BS, Exercise Physiologist;Dalton Kris Mouton, MS, ACSM-CEP, Exercise Physiologist;Debra Wynetta Emery, RN, BSN    Virtual Visit No    Medication changes reported     No    Fall or balance concerns reported    No    Tobacco Cessation No Change    Warm-up and Cool-down Performed as group-led instruction    Resistance Training Performed Yes    VAD Patient? No    PAD/SET Patient? No      Pain Assessment   Currently in Pain? No/denies    Pain Score 0-No pain    Multiple Pain Sites No             Capillary Blood Glucose: No results found for this or any previous visit (from the past 24 hour(s)).    Social History   Tobacco Use  Smoking Status Never  Smokeless Tobacco Never    Goals Met:  Independence with exercise equipment Exercise tolerated well No report of concerns or symptoms today Strength training completed today  Goals Unmet:  Not Applicable  Comments: check out @ 12:00   Dr. Kathie Dike is Medical Director for [facility] Pulmonary Rehab.

## 2021-05-05 ENCOUNTER — Other Ambulatory Visit: Payer: Self-pay

## 2021-05-05 ENCOUNTER — Encounter (HOSPITAL_COMMUNITY)
Admission: RE | Admit: 2021-05-05 | Discharge: 2021-05-05 | Disposition: A | Discharge status: Home / Self Care | Payer: Medicare Other | Source: Ambulatory Visit | Attending: Cardiovascular Disease | Admitting: Cardiovascular Disease

## 2021-05-05 DIAGNOSIS — I213 ST elevation (STEMI) myocardial infarction of unspecified site: Secondary | ICD-10-CM | POA: Diagnosis not present

## 2021-05-05 DIAGNOSIS — Z955 Presence of coronary angioplasty implant and graft: Secondary | ICD-10-CM | POA: Diagnosis not present

## 2021-05-05 NOTE — Progress Notes (Signed)
Daily Session Note  Patient Details  Name: Amber Peterson MRN: [id number] Date of Birth: [birth year] Referring Provider:   Flowsheet Row CARDIAC REHAB PHASE II ORIENTATION from 02/27/2021 in [place]  Referring Provider Dr. Ellyn Hack       Encounter Date: 05/05/2021  Check In:  Session Check In - 05/05/21 1100       Check-In   Supervising physician immediately available to respond to emergencies [facility] MD immediately available    Physician(s) Dr. Radford Pax    Location [facility]    Staff Present Hoy Register, MS, ACSM-CEP, Exercise Physiologist;Other    Virtual Visit No    Medication changes reported     No    Fall or balance concerns reported    No    Tobacco Cessation No Change    Warm-up and Cool-down Performed as group-led instruction    Resistance Training Performed Yes    VAD Patient? No    PAD/SET Patient? No      Pain Assessment   Currently in Pain? No/denies    Pain Score 0-No pain    Multiple Pain Sites No             Capillary Blood Glucose: No results found for this or any previous visit (from the past 24 hour(s)).    Social History   Tobacco Use  Smoking Status Never  Smokeless Tobacco Never    Goals Met:  Independence with exercise equipment Exercise tolerated well No report of concerns or symptoms today Strength training completed today  Goals Unmet:  Not Applicable  Comments: checkout time is 1200   Dr. Kathie Dike is Medical Director for [facility] Pulmonary Rehab.

## 2021-05-07 ENCOUNTER — Encounter (HOSPITAL_COMMUNITY)
Admission: RE | Admit: 2021-05-07 | Discharge: 2021-05-07 | Disposition: A | Discharge status: Home / Self Care | Payer: Medicare Other | Source: Ambulatory Visit | Attending: Cardiovascular Disease | Admitting: Cardiovascular Disease

## 2021-05-07 DIAGNOSIS — I213 ST elevation (STEMI) myocardial infarction of unspecified site: Secondary | ICD-10-CM | POA: Diagnosis not present

## 2021-05-07 DIAGNOSIS — Z955 Presence of coronary angioplasty implant and graft: Secondary | ICD-10-CM | POA: Diagnosis not present

## 2021-05-07 NOTE — Progress Notes (Signed)
Daily Session Note ° °Patient Details  °Name: Amber Peterson °MRN: [id number] °Date of Birth: 06/28/1955 °Referring Provider:   °Flowsheet Row CARDIAC REHAB PHASE II ORIENTATION from 02/27/2021 in [place] CARDIAC REHABILITATION  °Referring Provider Dr. Harding  ° °  ° ° °Encounter Date: 05/07/2021 ° °Check In: ° Session Check In - 05/07/21 1100   ° °  ° Check-In  ° Supervising physician immediately available to respond to emergencies [facility] MD immediately available   ° Physician(s) Dr. McDowell   ° Location [facility]   ° Staff Present Dalton Fletcher, MS, ACSM-CEP, Exercise Physiologist;Other;Phyllis Billingsley, RN;Heather Jachimiak, BS, Exercise Physiologist;Debra Johnson, RN, BSN   ° Virtual Visit No   ° Medication changes reported     No   ° Fall or balance concerns reported    No   ° Tobacco Cessation No Change   ° Warm-up and Cool-down Performed as group-led instruction   ° Resistance Training Performed Yes   ° VAD Patient? No   ° PAD/SET Patient? No   °  ° Pain Assessment  ° Currently in Pain? No/denies   ° Pain Score 0-No pain   ° Multiple Pain Sites No   ° °  °  ° °  ° ° °Capillary Blood Glucose: °No results found for this or any previous visit (from the past 24 hour(s)). ° ° ° °Social History  ° °Tobacco Use  °Smoking Status Never  °Smokeless Tobacco Never  ° ° °Goals Met:  °Independence with exercise equipment °Exercise tolerated well °No report of concerns or symptoms today °Strength training completed today ° °Goals Unmet:  °Not Applicable ° °Comments: check out @ 12:00pm ° ° °Dr. Jehanzeb Memon is Medical Director for [facility] Pul[facility] Re[facility]. °

## 2021-05-09 ENCOUNTER — Encounter (HOSPITAL_COMMUNITY): Payer: Medicare Other

## 2021-05-12 ENCOUNTER — Encounter (HOSPITAL_COMMUNITY)
Admission: RE | Admit: 2021-05-12 | Discharge: 2021-05-12 | Disposition: A | Discharge status: Home / Self Care | Payer: Medicare Other | Source: Ambulatory Visit | Attending: Cardiovascular Disease | Admitting: Cardiovascular Disease

## 2021-05-12 VITALS — Wt 130.7 lb

## 2021-05-12 DIAGNOSIS — I213 ST elevation (STEMI) myocardial infarction of unspecified site: Secondary | ICD-10-CM | POA: Diagnosis not present

## 2021-05-12 DIAGNOSIS — Z955 Presence of coronary angioplasty implant and graft: Secondary | ICD-10-CM | POA: Diagnosis not present

## 2021-05-12 NOTE — Progress Notes (Signed)
Daily Session Note  Patient Details  Name: Amber Peterson MRN: [id number] Date of Birth: [birth year] Referring Provider:   Flowsheet Row CARDIAC REHAB PHASE II ORIENTATION from 02/27/2021 in [place]  Referring Provider Dr. Ellyn Hack       Encounter Date: 05/12/2021  Check In:  Session Check In - 05/12/21 1100       Check-In   Supervising physician immediately available to respond to emergencies [facility] MD immediately available    Physician(s) Dr.Pemberton    Location [facility]    Staff Present Geanie Cooley, RN;Dalton Kris Mouton, MS, ACSM-CEP, Exercise Physiologist;Debra Wynetta Emery, RN, BSN    Virtual Visit No    Medication changes reported     No    Fall or balance concerns reported    No    Tobacco Cessation No Change    Warm-up and Cool-down Performed as group-led instruction    Resistance Training Performed Yes    VAD Patient? No    PAD/SET Patient? No      Pain Assessment   Currently in Pain? No/denies    Pain Score 0-No pain    Multiple Pain Sites No             Capillary Blood Glucose: No results found for this or any previous visit (from the past 24 hour(s)).    Social History   Tobacco Use  Smoking Status Never  Smokeless Tobacco Never    Goals Met:  Independence with exercise equipment Exercise tolerated well No report of concerns or symptoms today Strength training completed today  Goals Unmet:  Not Applicable  Comments: check out @ 12:00pm   Dr. Kathie Dike is Medical Director for [facility] Pulmonary Rehab.

## 2021-05-14 ENCOUNTER — Encounter (HOSPITAL_COMMUNITY): Payer: Medicare Other

## 2021-05-14 NOTE — Progress Notes (Signed)
Cardiac Individual Treatment Plan  Patient Details  Name: Amber Peterson MRN: [id number] Date of Birth: 1955/08/24 Referring Provider:   Flowsheet Row CARDIAC REHAB PHASE II ORIENTATION from 02/27/2021 in [facility]  Referring Provider Dr. Ellyn Hack       Initial Encounter Date:  Flowsheet Row CARDIAC REHAB PHASE II ORIENTATION from 02/27/2021 in [facility]  Date 02/27/21       Visit Diagnosis: ST elevation myocardial infarction (STEMI), unspecified artery ([place])  S/P coronary artery stent placement  Patient's Home Medications on Admission:  Current Outpatient Medications:    acetaminophen (TYLENOL) 500 MG tablet, Take 2,000 mg by mouth 4 (four) times daily as needed for moderate pain., Disp: , Rfl:    albuterol (VENTOLIN HFA) 108 (90 Base) MCG/ACT inhaler, Inhale 2 puffs into the lungs every 6 (six) hours as needed for wheezing or shortness of breath., Disp: , Rfl:    ALPRAZolam (XANAX) 0.5 MG tablet, Take 0.5 mg by mouth 2 (two) times daily., Disp: , Rfl:    Ascorbic Acid (VITAMIN C PO), Take 1 tablet by mouth daily., Disp: , Rfl:    atorvastatin (LIPITOR) 40 MG tablet, Take 1 tablet (40 mg total) by mouth daily., Disp: 90 tablet, Rfl: 3   Biotin 1000 MCG tablet, Take 1,000 mcg by mouth daily., Disp: , Rfl:    budesonide-formoterol (SYMBICORT) 160-4.5 MCG/ACT inhaler, Inhale 2 puffs into the lungs 2 (two) times daily., Disp: , Rfl:    Cholecalciferol (DIALYVITE VITAMIN D 5000 PO), Take 5,000 Units by mouth daily., Disp: , Rfl:    gabapentin (NEURONTIN) 300 MG capsule, Take 1 capsule (300 mg total) by mouth 3 (three) times daily. (Patient taking differently: Take 300 mg by mouth 3 (three) times daily as needed (pain).), Disp: 60 capsule, Rfl: 0   HYDROcodone-acetaminophen (NORCO/VICODIN) 5-325 MG tablet, Take 1 tablet by mouth every 8 (eight) hours., Disp: , Rfl:    hydrocortisone cream 1 %, Apply 1 application topically daily as needed  for itching., Disp: , Rfl:    metoprolol succinate (TOPROL-XL) 25 MG 24 hr tablet, Take 1 tablet (25 mg total) by mouth daily., Disp: 30 tablet, Rfl: 5   nitroGLYCERIN (NITROSTAT) 0.4 MG SL tablet, Place 1 tablet (0.4 mg total) under the tongue every 5 (five) minutes as needed for chest pain., Disp: 25 tablet, Rfl: 1   prasugrel (EFFIENT) 10 MG TABS tablet, Take 1 tablet (10 mg total) by mouth daily. Do NOT take with Brilinta., Disp: 90 tablet, Rfl: 3   sacubitril-valsartan (ENTRESTO) 24-26 MG, Take 1 tablet by mouth 2 (two) times daily., Disp: 60 tablet, Rfl: 5   Tetrahydrozoline HCl (VISINE OP), Place 1 drop into both eyes daily as needed (dry eyes)., Disp: , Rfl:    thyroid (ARMOUR) 15 MG tablet, Take 15 mg by mouth daily., Disp: , Rfl:    traZODone (DESYREL) 50 MG tablet, Take 150 mg by mouth at bedtime., Disp: , Rfl:    venlafaxine XR (EFFEXOR-XR) 150 MG 24 hr capsule, Take 150 mg by mouth daily with breakfast., Disp: , Rfl:    vitamin B-12 (CYANOCOBALAMIN) 1000 MCG tablet, Take 1,000 mcg by mouth daily., Disp: , Rfl:    zinc gluconate 50 MG tablet, Take 50 mg by mouth daily., Disp: , Rfl:   Past Medical History: Past Medical History:  Diagnosis Date   Acute ST elevation myocardial infarction (STEMI) due to occlusion of distal portion of left anterior descending (LAD) coronary artery ([place]) 12/08/2020  100 % prox-mid LAD - DES PCI (also staged PCI-PTCA of LCx-OM2 (stent)-AVGVCx (PTCA).  Initial EF 30-35% -> Improved to 55-60% on 4 month f/u Echo.   Anxiety    Arthritis    all major joint   Asthma 07/22/2017   Depression    Hepatitis C 07/22/2017   History of kidney stones    Hypertension    Ischemic cardiomyopathy - RESOLVED 12/10/2020   EF 30 to 35% with anterior, anterolateral and inferior hypokinesis to akinesis consistent with LAD infarct. -> 4 month Post-MI / PCI Echo - EF 55-60%.   ~2 Vessel CORONARY ARTERY DISEASE -  LAD & LCx-OM2 12/08/2020   12/08/2020: 100 % mLAD - DES PCI  Onyx Frontier DES 2.5 x 26; 80%-99% bifurcation LCx-OM/AVG Cx (Sttaged PCI)> DES PCI Prox-Mid Cx 25%->OM2 90% -> DES PCI ONYX FRONTIER 2.0 x 15 -> 2.3 mm; PTCA of AVG LCx (thrombotic 99%) -> 1.5 mm Balloon PTCA (through stent) - 20% residual.    Tobacco Use: Social History   Tobacco Use  Smoking Status Never  Smokeless Tobacco Never    Labs: Recent Review Flowsheet Data     Labs for ITP Cardiac and Pulmonary Rehab Latest Ref Rng & Units 12/08/2020 01/21/2021   Cholestrol 100 - 199 mg/dL 218(H) 118   LDLCALC 0 - 99 mg/dL 135(H) 45   HDL >39 mg/dL 56 53   Trlycerides 0 - 149 mg/dL 135 111   Hemoglobin A1c 4.8 - 5.6 % 5.6 -       Capillary Blood Glucose: No results found for: GLUCAP   Exercise Target Goals: Exercise Program Goal: Individual exercise prescription set using results from initial 6 min walk test and THRR while considering  patients activity barriers and safety.   Exercise Prescription Goal: Starting with aerobic activity 30 plus minutes a day, 3 days per week for initial exercise prescription. Provide home exercise prescription and guidelines that participant acknowledges understanding prior to discharge.  Activity Barriers & Risk Stratification:  Activity Barriers & Cardiac Risk Stratification - 02/27/21 0830       Activity Barriers & Cardiac Risk Stratification   Activity Barriers Arthritis;Neck/Spine Problems;Joint Problems;Muscular Weakness;History of Falls    Cardiac Risk Stratification High             6 Minute Walk:  6 Minute Walk     Row Name 02/27/21 1049         6 Minute Walk   Phase Initial     Distance 1200 feet     Walk Time 6 minutes     # of Rest Breaks 0     MPH 2.3     METS 2.58     RPE 11     VO2 Peak 8.83     Symptoms No     Resting HR 83 bpm     Resting BP 98/60     Resting Oxygen Saturation  98 %     Exercise Oxygen Saturation  during 6 min walk 99 %     Max Ex. HR 94 bpm     Max Ex. BP 115/65     2 Minute Post BP  96/60              Oxygen Initial Assessment:   Oxygen Re-Evaluation:   Oxygen Discharge (Final Oxygen Re-Evaluation):   Initial Exercise Prescription:  Initial Exercise Prescription - 02/27/21 1000       Date of Initial Exercise RX and Referring Provider   Date 02/27/21  Referring Provider Dr. Ellyn Hack    Expected Discharge Date 05/23/21      Treadmill   MPH 1.5    Grade 0    Minutes 17      NuStep   Level 1    SPM 60    Minutes 22      Prescription Details   Frequency (times per week) 3    Duration Progress to 30 minutes of continuous aerobic without signs/symptoms of physical distress      Intensity   THRR 40-80% of Max Heartrate 62-123    Ratings of Perceived Exertion 11-13    Perceived Dyspnea 0-4      Resistance Training   Training Prescription Yes    Weight 3    Reps 10-15             Perform Capillary Blood Glucose checks as needed.  Exercise Prescription Changes:   Exercise Prescription Changes     Row Name 03/03/21 1200 03/17/21 1150 03/28/21 1100 04/21/21 1100 04/28/21 1300     Response to Exercise   Blood Pressure (Admit) 96/60 -- 99/60 -- 105/58   Blood Pressure (Exercise) 120/62 140/65 110/62 -- 118/60   Blood Pressure (Exit) 94/58 118/70 92/60 -- 100/60   Heart Rate (Admit) 88 bpm 80 bpm 72 bpm -- 72 bpm   Heart Rate (Exercise) 110 bpm 106 bpm 109 bpm -- 103 bpm   Heart Rate (Exit) 97 bpm 89 bpm 81 bpm -- 80 bpm   Rating of Perceived Exertion (Exercise) 13 11 11  -- 12   Duration Continue with 30 min of aerobic exercise without signs/symptoms of physical distress. Continue with 30 min of aerobic exercise without signs/symptoms of physical distress. Continue with 30 min of aerobic exercise without signs/symptoms of physical distress. -- Continue with 30 min of aerobic exercise without signs/symptoms of physical distress.   Intensity THRR unchanged THRR unchanged THRR unchanged -- THRR unchanged     Progression   Progression  Continue to progress workloads to maintain intensity without signs/symptoms of physical distress. Continue to progress workloads to maintain intensity without signs/symptoms of physical distress. Continue to progress workloads to maintain intensity without signs/symptoms of physical distress. -- Continue to progress workloads to maintain intensity without signs/symptoms of physical distress.     Resistance Training   Training Prescription Yes Yes Yes -- Yes   Weight 3 3 4  -- 4   Reps 10-15 10-15 10-15 -- 10-15   Time 10 Minutes 10 Minutes 10 Minutes -- 10 Minutes     Treadmill   MPH 1.9 1.8 1.7 -- 1.5   Grade 0 0 2 -- 2.5   Minutes 17 17 17  -- 17   METs 2.45 2.38 2.77 -- 2.97     NuStep   Level 1 1 2  -- 2   SPM 78 77 95 -- 84   Minutes 22 22 22  -- 22   METs 1.91 1.86 2.24 -- 1.96     Home Exercise Plan   Plans to continue exercise at -- -- -- Home (comment) --   Frequency -- -- -- Add 2 additional days to program exercise sessions. --   Initial Home Exercises Provided -- -- -- 04/21/21 --    [place] Name 05/12/21 1100             Response to Exercise   Blood Pressure (Admit) 100/50       Blood Pressure (Exercise) 112/70       Blood Pressure (Exit) 100/64  Heart Rate (Admit) 73 bpm       Heart Rate (Exercise) 120 bpm       Heart Rate (Exit) 82 bpm       Rating of Perceived Exertion (Exercise) 13       Duration Continue with 30 min of aerobic exercise without signs/symptoms of physical distress.       Intensity THRR unchanged         Progression   Progression Continue to progress workloads to maintain intensity without signs/symptoms of physical distress.         Resistance Training   Training Prescription Yes       Weight 3       Reps 10-15       Time 10 Minutes         Treadmill   MPH 2.6       Grade 3       Minutes 17       METs 4.07         NuStep   Level 1       SPM 104       Minutes 22       METs 2.77                Exercise Comments:    Exercise Comments     Row Name 04/21/21 1142           Exercise Comments home exercised reviewed                Exercise Goals and Review:   Exercise Goals     Row Name 02/27/21 1052 03/18/21 1856 04/15/21 1424 05/13/21 1251       Exercise Goals   Increase Physical Activity Yes Yes Yes Yes    Intervention Provide advice, education, support and counseling about physical activity/exercise needs.;Develop an individualized exercise prescription for aerobic and resistive training based on initial evaluation findings, risk stratification, comorbidities and participant's personal goals. Provide advice, education, support and counseling about physical activity/exercise needs.;Develop an individualized exercise prescription for aerobic and resistive training based on initial evaluation findings, risk stratification, comorbidities and participant's personal goals. Provide advice, education, support and counseling about physical activity/exercise needs.;Develop an individualized exercise prescription for aerobic and resistive training based on initial evaluation findings, risk stratification, comorbidities and participant's personal goals. Provide advice, education, support and counseling about physical activity/exercise needs.;Develop an individualized exercise prescription for aerobic and resistive training based on initial evaluation findings, risk stratification, comorbidities and participant's personal goals.    Expected Outcomes Short Term: Attend rehab on a regular basis to increase amount of physical activity.;Long Term: Add in home exercise to make exercise part of routine and to increase amount of physical activity.;Long Term: Exercising regularly at least 3-5 days a week. Short Term: Attend rehab on a regular basis to increase amount of physical activity.;Long Term: Add in home exercise to make exercise part of routine and to increase amount of physical activity.;Long Term: Exercising  regularly at least 3-5 days a week. Short Term: Attend rehab on a regular basis to increase amount of physical activity.;Long Term: Add in home exercise to make exercise part of routine and to increase amount of physical activity.;Long Term: Exercising regularly at least 3-5 days a week. Short Term: Attend rehab on a regular basis to increase amount of physical activity.;Long Term: Add in home exercise to make exercise part of routine and to increase amount of physical activity.;Long Term: Exercising regularly at least 3-5 days  a week.    Increase Strength and Stamina Yes Yes Yes Yes    Intervention Provide advice, education, support and counseling about physical activity/exercise needs.;Develop an individualized exercise prescription for aerobic and resistive training based on initial evaluation findings, risk stratification, comorbidities and participant's personal goals. Provide advice, education, support and counseling about physical activity/exercise needs.;Develop an individualized exercise prescription for aerobic and resistive training based on initial evaluation findings, risk stratification, comorbidities and participant's personal goals. Provide advice, education, support and counseling about physical activity/exercise needs.;Develop an individualized exercise prescription for aerobic and resistive training based on initial evaluation findings, risk stratification, comorbidities and participant's personal goals. Provide advice, education, support and counseling about physical activity/exercise needs.;Develop an individualized exercise prescription for aerobic and resistive training based on initial evaluation findings, risk stratification, comorbidities and participant's personal goals.    Expected Outcomes Short Term: Increase workloads from initial exercise prescription for resistance, speed, and METs.;Short Term: Perform resistance training exercises routinely during rehab and add in resistance  training at home;Long Term: Improve cardiorespiratory fitness, muscular endurance and strength as measured by increased METs and functional capacity (6MWT) Short Term: Increase workloads from initial exercise prescription for resistance, speed, and METs.;Short Term: Perform resistance training exercises routinely during rehab and add in resistance training at home;Long Term: Improve cardiorespiratory fitness, muscular endurance and strength as measured by increased METs and functional capacity (6MWT) Short Term: Increase workloads from initial exercise prescription for resistance, speed, and METs.;Short Term: Perform resistance training exercises routinely during rehab and add in resistance training at home;Long Term: Improve cardiorespiratory fitness, muscular endurance and strength as measured by increased METs and functional capacity (6MWT) Short Term: Increase workloads from initial exercise prescription for resistance, speed, and METs.;Short Term: Perform resistance training exercises routinely during rehab and add in resistance training at home;Long Term: Improve cardiorespiratory fitness, muscular endurance and strength as measured by increased METs and functional capacity (6MWT)    Able to understand and use rate of perceived exertion (RPE) scale Yes Yes Yes Yes    Intervention Provide education and explanation on how to use RPE scale Provide education and explanation on how to use RPE scale Provide education and explanation on how to use RPE scale Provide education and explanation on how to use RPE scale    Expected Outcomes Short Term: Able to use RPE daily in rehab to express subjective intensity level;Long Term:  Able to use RPE to guide intensity level when exercising independently Short Term: Able to use RPE daily in rehab to express subjective intensity level;Long Term:  Able to use RPE to guide intensity level when exercising independently Short Term: Able to use RPE daily in rehab to express  subjective intensity level;Long Term:  Able to use RPE to guide intensity level when exercising independently Short Term: Able to use RPE daily in rehab to express subjective intensity level;Long Term:  Able to use RPE to guide intensity level when exercising independently    Knowledge and understanding of Target Heart Rate Range (THRR) Yes Yes Yes Yes    Intervention Provide education and explanation of THRR including how the numbers were predicted and where they are located for reference Provide education and explanation of THRR including how the numbers were predicted and where they are located for reference Provide education and explanation of THRR including how the numbers were predicted and where they are located for reference Provide education and explanation of THRR including how the numbers were predicted and where they are located for reference  Expected Outcomes Short Term: Able to state/look up THRR;Short Term: Able to use daily as guideline for intensity in rehab;Long Term: Able to use THRR to govern intensity when exercising independently Short Term: Able to state/look up THRR;Short Term: Able to use daily as guideline for intensity in rehab;Long Term: Able to use THRR to govern intensity when exercising independently Short Term: Able to state/look up THRR;Short Term: Able to use daily as guideline for intensity in rehab;Long Term: Able to use THRR to govern intensity when exercising independently Short Term: Able to state/look up THRR;Short Term: Able to use daily as guideline for intensity in rehab;Long Term: Able to use THRR to govern intensity when exercising independently    Able to check pulse independently Yes Yes Yes Yes    Intervention Provide education and demonstration on how to check pulse in carotid and radial arteries.;Review the importance of being able to check your own pulse for safety during independent exercise Provide education and demonstration on how to check pulse in  carotid and radial arteries.;Review the importance of being able to check your own pulse for safety during independent exercise Provide education and demonstration on how to check pulse in carotid and radial arteries.;Review the importance of being able to check your own pulse for safety during independent exercise Provide education and demonstration on how to check pulse in carotid and radial arteries.;Review the importance of being able to check your own pulse for safety during independent exercise    Expected Outcomes Short Term: Able to explain why pulse checking is important during independent exercise;Long Term: Able to check pulse independently and accurately Short Term: Able to explain why pulse checking is important during independent exercise;Long Term: Able to check pulse independently and accurately Short Term: Able to explain why pulse checking is important during independent exercise;Long Term: Able to check pulse independently and accurately Short Term: Able to explain why pulse checking is important during independent exercise;Long Term: Able to check pulse independently and accurately    Understanding of Exercise Prescription Yes Yes Yes Yes    Intervention Provide education, explanation, and written materials on patient's individual exercise prescription Provide education, explanation, and written materials on patient's individual exercise prescription Provide education, explanation, and written materials on patient's individual exercise prescription Provide education, explanation, and written materials on patient's individual exercise prescription    Expected Outcomes Short Term: Able to explain program exercise prescription;Long Term: Able to explain home exercise prescription to exercise independently Short Term: Able to explain program exercise prescription;Long Term: Able to explain home exercise prescription to exercise independently Short Term: Able to explain program exercise  prescription;Long Term: Able to explain home exercise prescription to exercise independently Short Term: Able to explain program exercise prescription;Long Term: Able to explain home exercise prescription to exercise independently             Exercise Goals Re-Evaluation :  Exercise Goals Re-Evaluation     Row Name 03/18/21 2025 04/15/21 1425 05/13/21 1251         Exercise Goal Re-Evaluation   Exercise Goals Review Increase Physical Activity;Increase Strength and Stamina;Able to understand and use rate of perceived exertion (RPE) scale;Knowledge and understanding of Target Heart Rate Range (THRR);Able to check pulse independently;Understanding of Exercise Prescription Increase Physical Activity;Increase Strength and Stamina;Able to understand and use rate of perceived exertion (RPE) scale;Knowledge and understanding of Target Heart Rate Range (THRR);Able to check pulse independently;Understanding of Exercise Prescription Increase Physical Activity;Increase Strength and Stamina;Able to understand and use rate of perceived  exertion (RPE) scale;Knowledge and understanding of Target Heart Rate Range (THRR);Able to check pulse independently;Understanding of Exercise Prescription     Comments Pt has completed 5 sessions of cardiac rehab. She is motivated during class. She is currently exercising at 2.38 METs on the TM. Will continue to monitor and progress as able. Pt has completed 9 sessions of cardiac rehab. She last attended 03/28/2021. She has had covid since then and has been too sick to come in. She is set to return 04/16/2020. She was exercising at 2.77 METs on the treadmill. Will continue to monitor and progress as able. Pt has completed 20 sessions of cardiac rehab. She has been progressing well in the program. Some days she pushes herself and other days she does not give much effort. She is currently exercising at 4.07 METs. Will continue to monitor and progress as able.     Expected Outcomes  Through exercise at rehab and at home, the patient will meet their stated goals. Through exercise at rehab and at home, the patient will meet their stated goals. Through exercise at rehab and at home, the patient will meet their stated goals.               Discharge Exercise Prescription (Final Exercise Prescription Changes):  Exercise Prescription Changes - 05/12/21 1100       Response to Exercise   Blood Pressure (Admit) 100/50    Blood Pressure (Exercise) 112/70    Blood Pressure (Exit) 100/64    Heart Rate (Admit) 73 bpm    Heart Rate (Exercise) 120 bpm    Heart Rate (Exit) 82 bpm    Rating of Perceived Exertion (Exercise) 13    Duration Continue with 30 min of aerobic exercise without signs/symptoms of physical distress.    Intensity THRR unchanged      Progression   Progression Continue to progress workloads to maintain intensity without signs/symptoms of physical distress.      Resistance Training   Training Prescription Yes    Weight 3    Reps 10-15    Time 10 Minutes      Treadmill   MPH 2.6    Grade 3    Minutes 17    METs 4.07      NuStep   Level 1    SPM 104    Minutes 22    METs 2.77             Nutrition:  Target Goals: Understanding of nutrition guidelines, daily intake of sodium 1500mg , cholesterol 200mg , calories 30% from fat and 7% or less from saturated fats, daily to have 5 or more servings of fruits and vegetables.  Biometrics:  Pre Biometrics - 02/27/21 1052       Pre Biometrics   Height 4\' 10"  (1.473 m)    Weight 62.8 kg    Waist Circumference 35.6 inches    Hip Circumference 38 inches    Waist to Hip Ratio 0.94 %    BMI (Calculated) 28.94    Triceps Skinfold 13 mm    % Body Fat 36.8 %    Grip Strength 20.6 kg    Flexibility 13 in    Single Leg Stand 60 seconds              Nutrition Therapy Plan and Nutrition Goals:  Nutrition Therapy & Goals - 02/27/21 0910       Personal Nutrition Goals   Comments Patient  scored 13 on her diet assessment. Handout discussed  and provided regarding healthier choices. We offer 2 educational sessions on heart healthy nutrition with handouts. She says she follows a low sodium diet no more that 2000 mg/day and does not eat any red meat.      Intervention Plan   Intervention Nutrition handout(s) given to patient.    Expected Outcomes Short Term Goal: Understand basic principles of dietary content, such as calories, fat, sodium, cholesterol and nutrients.             Nutrition Assessments:  Nutrition Assessments - 02/27/21 0910       MEDFICTS Scores   Pre Score 13            MEDIFICTS Score Key: ?70 Need to make dietary changes  40-70 Heart Healthy Diet ? 40 Therapeutic Level Cholesterol Diet   Picture Your Plate Scores: <98 Unhealthy dietary pattern with much room for improvement. 41-50 Dietary pattern unlikely to meet recommendations for good health and room for improvement. 51-60 More healthful dietary pattern, with some room for improvement.  >60 Healthy dietary pattern, although there may be some specific behaviors that could be improved.    Nutrition Goals Re-Evaluation:   Nutrition Goals Discharge (Final Nutrition Goals Re-Evaluation):   Psychosocial: Target Goals: Acknowledge presence or absence of significant depression and/or stress, maximize coping skills, provide positive support system. Participant is able to verbalize types and ability to use techniques and skills needed for reducing stress and depression.  Initial Review & Psychosocial Screening:  Initial Psych Review & Screening - 02/27/21 0916       Initial Review   Current issues with Current Depression;History of Depression;Current Anxiety/Panic      Family Dynamics   Good Support System? Yes      Barriers   Psychosocial barriers to participate in program The patient should benefit from training in stress management and relaxation.;There are no identifiable barriers  or psychosocial needs.      Screening Interventions   Interventions Encouraged to exercise;Provide feedback about the scores to participant;To provide support and resources with identified psychosocial needs    Expected Outcomes Short Term goal: Utilizing psychosocial counselor, staff and physician to assist with identification of specific Stressors or current issues interfering with healing process. Setting desired goal for each stressor or current issue identified.;Short Term goal: Identification and review with participant of any Quality of Life or Depression concerns found by scoring the questionnaire.;Long Term goal: The participant improves quality of Life and PHQ9 Scores as seen by post scores and/or verbalization of changes             Quality of Life Scores:  Quality of Life - 02/27/21 1053       Quality of Life   Select Quality of Life      Quality of Life Scores   Health/Function Pre 24.81 %    Socioeconomic Pre 24.21 %    Psych/Spiritual Pre 25.5 %    Family Pre 27.6 %    GLOBAL Pre 25.27 %            Scores of 19 and below usually indicate a poorer quality of life in these areas.  A difference of  2-3 points is a clinically meaningful difference.  A difference of 2-3 points in the total score of the Quality of Life Index has been associated with significant improvement in overall quality of life, self-image, physical symptoms, and general health in studies assessing change in quality of life.  PHQ-9: Recent Review Flowsheet Data  Depression screen PHQ 2/9 02/27/2021   Decreased Interest 1   Down, Depressed, Hopeless 1   PHQ - 2 Score 2   Altered sleeping 0   Tired, decreased energy 1   Change in appetite 2   Feeling bad or failure about yourself  1   Trouble concentrating 2   Moving slowly or fidgety/restless 0   Suicidal thoughts 0   PHQ-9 Score 8   Difficult doing work/chores Not difficult at all      Interpretation of Total Score  Total Score  Depression Severity:  1-4 = Minimal depression, 5-9 = Mild depression, 10-14 = Moderate depression, 15-19 = Moderately severe depression, 20-27 = Severe depression   Psychosocial Evaluation and Intervention:  Psychosocial Evaluation - 02/27/21 0916       Psychosocial Evaluation & Interventions   Interventions Stress management education;Relaxation education;Encouraged to exercise with the program and follow exercise prescription    Comments Patient has no psychosocial barriers identified to participate in CR. Her initial PHQ-9 score was 8. 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 She feels her anxiety and depression are managed well with medication. She has been on treatment longterm. She names her 2 sisters as her emotional support people. She says they are very close. She has one daugther that she is also very close with who just recently got married. She has a person she names as her signficiant other. She says they have been together for 18 years but she primarily lives alone. She demonstrates a strong interest in doing the program and improving her health overall. 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 Her cardiologist wanted her to wear the vest until repeat ECHO 03/24/21 and would discontinue if EF if at least 35%. She has not worn the vest since 11/3 due to skin breakdown on her back. She was told we would need clearence from her cardiologist before we proceed with any exercise without the vest. She agreed to wear the vest while in cardiac rehab. She is very anxious to start the program hoping this will imrpove her health overall and get herself and her heart stronger. She also wants to learn what she can and can't do and how hard she can push herself.    Expected Outcomes Patient will continue to have no psychosocial barriers identified.    Continue Psychosocial Services  No Follow up  required             Psychosocial Re-Evaluation:  Psychosocial Re-Evaluation     [place] Name 03/12/21 1307 04/03/21 1505 05/05/21 1400         Psychosocial Re-Evaluation   Current issues with Current Depression;History of Depression;Current Anxiety/Panic Current Depression;History of Depression;Current Anxiety/Panic Current Depression;History of Depression;Current Anxiety/Panic     Comments Patient is new to the program completing 2 sessions with current psychosocial issues of depression and GAD which are managed with Alprazolam and Effexor and Trazadone for sleep. Patient feels her issues are managed with medication. We will continue to monitor. Patient has completed 9 sessions with current psychosocial issues of depression and GAD continue to be managed with Alprazolam and Effexor and Trazadone for sleep. Patient continues to feel like her issues are managed with medication. She continues to have no psychosocial barriers identified. We will continue to monitor. Patient has completed 17 sessions with current psychosocial issues of depression and GAD which continue to be  managed with Alprazolam and Effexor and Trazadone for sleep. Patient continues to feel like her issues are managed with medication. She continues to have no psychosocial barriers identified. We will continue to monitor.     Expected Outcomes Patient psychosocial issues will continue to be managed with medication. Patient psychosocial issues will continue to be managed with medication with no barriers identified. Patient psychosocial issues will continue to be managed with medication with no barriers identified.     Interventions Stress management education;Encouraged to attend Cardiac Rehabilitation for the exercise;Relaxation education Stress management education;Encouraged to attend Cardiac Rehabilitation for the exercise;Relaxation education Stress management education;Encouraged to attend Cardiac Rehabilitation for the  exercise;Relaxation education     Continue Psychosocial Services  Follow up required by staff No Follow up required No Follow up required              Psychosocial Discharge (Final Psychosocial Re-Evaluation):  Psychosocial Re-Evaluation - 05/05/21 1400       Psychosocial Re-Evaluation   Current issues with Current Depression;History of Depression;Current Anxiety/Panic    Comments Patient has completed 17 sessions with current psychosocial issues of depression and GAD which continue to be managed with Alprazolam and Effexor and Trazadone for sleep. Patient continues to feel like her issues are managed with medication. She continues to have no psychosocial barriers identified. We will continue to monitor.    Expected Outcomes Patient psychosocial issues will continue to be managed with medication with no barriers identified.    Interventions Stress management education;Encouraged to attend Cardiac Rehabilitation for the exercise;Relaxation education    Continue Psychosocial Services  No Follow up required             Vocational Rehabilitation: Provide vocational rehab assistance to qualifying candidates.   Vocational Rehab Evaluation & Intervention:  Vocational Rehab - 02/27/21 0911       Initial Vocational Rehab Evaluation & Intervention   Assessment shows need for Vocational Rehabilitation No      Vocational Rehab Re-Evaulation   Comments Patient is retired and does not need vocational rehab.             Education: Education Goals: Education classes will be provided on a weekly basis, covering required topics. Participant will state understanding/return demonstration of topics presented.  Learning Barriers/Preferences:  Learning Barriers/Preferences - 02/27/21 0911       Learning Barriers/Preferences   Learning Barriers None    Learning Preferences Verbal Instruction;Audio;Written Material;Skilled Demonstration             Education  Topics: Hypertension, Hypertension Reduction -Define heart disease and high blood pressure. Discus how high blood pressure affects the body and ways to reduce high blood pressure. Flowsheet Row CARDIAC REHAB PHASE II EXERCISE from 05/07/2021 in [place]  Date 04/23/21  Educator [place]  Instruction Review Code 1- Verbalizes Understanding       Exercise and Your Heart -Discuss why it is important to exercise, the FITT principles of exercise, normal and abnormal responses to exercise, and how to exercise safely. Flowsheet Row CARDIAC REHAB PHASE II EXERCISE from 05/07/2021 in [place]  Date 04/30/21  Educator hj  Instruction Review Code 2- Demonstrated Understanding       Angina -Discuss definition of angina, causes of angina, treatment of angina, and how to decrease risk of having angina. Flowsheet Row CARDIAC REHAB PHASE II EXERCISE from 05/07/2021 in [place]  Date 05/07/21  Educator pb  Instruction Review Code 1- Verbalizes Understanding  Cardiac Medications -Review what the following cardiac medications are used for, how they affect the body, and side effects that may occur when taking the medications.  Medications include Aspirin, Beta blockers, calcium channel blockers, ACE Inhibitors, angiotensin receptor blockers, diuretics, digoxin, and antihyperlipidemics.   Congestive Heart Failure -Discuss the definition of CHF, how to live with CHF, the signs and symptoms of CHF, and how keep track of weight and sodium intake.   Heart Disease and Intimacy -Discus the effect sexual activity has on the heart, how changes occur during intimacy as we age, and safety during sexual activity.   Smoking Cessation / COPD -Discuss different methods to quit smoking, the health benefits of quitting smoking, and the definition of COPD.   Nutrition I: Fats -Discuss the types of cholesterol, what cholesterol does to  the heart, and how cholesterol levels can be controlled. Flowsheet Row CARDIAC REHAB PHASE II EXERCISE from 05/07/2021 in [place]  Date 03/05/21  Educator DF  Instruction Review Code 1- Verbalizes Understanding       Nutrition II: Labels -Discuss the different components of food labels and how to read food label [place] from 05/07/2021 in [place]  Date 03/19/21  Educator [place]  Instruction Review Code 2- Demonstrated Understanding       Heart Parts/Heart Disease and PAD -Discuss the anatomy of the heart, the pathway of blood circulation through the heart, and these are affected by heart disease. Flowsheet Row CARDIAC REHAB PHASE II EXERCISE from 05/07/2021 in [place]  Date 03/26/21  Educator pb  Instruction Review Code 1- Verbalizes Understanding       Stress I: Signs and Symptoms -Discuss the causes of stress, how stress may lead to anxiety and depression, and ways to limit stress.   Stress II: Relaxation -Discuss different types of relaxation techniques to limit stress.   Warning Signs of Stroke / TIA -Discuss definition of a stroke, what the signs and symptoms are of a stroke, and how to identify when someone is having stroke. Flowsheet Row CARDIAC REHAB PHASE II EXERCISE from 05/07/2021 in [place]  Date 04/16/21  Educator DF  Instruction Review Code 2- Demonstrated Understanding       Knowledge Questionnaire Score:  Knowledge Questionnaire Score - 02/27/21 1011       Knowledge Questionnaire Score   Pre Score 24/28             Core Components/Risk Factors/Patient Goals at Admission:  Personal Goals and Risk Factors at Admission - 02/27/21 0912       Core Components/Risk Factors/Patient Goals on Admission    Weight Management Weight Maintenance    Lipids Yes    Intervention Provide education and support for  participant on nutrition & aerobic/resistive exercise along with prescribed medications to achieve LDL 70mg , HDL >40mg .    Expected Outcomes Short Term: Participant states understanding of desired cholesterol values and is compliant with medications prescribed. Participant is following exercise prescription and nutrition guidelines.;Long Term: Cholesterol controlled with medications as prescribed, with individualized exercise RX and with personalized nutrition plan. Value goals: LDL < 70mg , HDL > 40 mg.    Personal Goal Other Yes    Personal Goal Patient wants to get both herself stronger and her heart stronger. She want to learn what she can do and how far to push herself. She want to get healthier overall.    Intervention Patient will attend CR 3 days/week  and supplement with exercise 2 days/week.    Expected Outcomes Patient will complete the program meeting both personal and program goals.             Core Components/Risk Factors/Patient Goals Review:   Goals and Risk Factor Review     Row Name 03/12/21 1309 04/03/21 1507 05/05/21 1356         Core Components/Risk Factors/Patient Goals Review   Personal Goals Review Lipids;Other Lipids;Other Lipids;Other     Review Patient was referred to CR with STEMI and DES. She is new to the program completing 2 sessions. She continues to wear a life vest for low EF with a repeat echo scheduled in December. Her personal goals for the program are to get healthier; get her heart stronger; learn how far to push herself. We will continue to monitor her progress as she works towards meeting these goals. Patient has completed 9 sessions. Her initial weight was 138.0 lbs and her current weight is 138.0 lbs. She is doing well in the program with progressions and consistent attendance. She is currently out with COVID with mild symptoms waiting for treatment from PCP. 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 Her EF is back to normal at 55 to  60% and her life vest was discontinued. Patient is very happy about this. She has a follow up OV with Dr. Ellyn Hack 04/22/21. Her blood pressue is well controlled. Her personal goals continue to be to get healthier; get her heart stronger; and learn how far to push herself. We will continue to monitor her progress as she works towards meeting these goals. Patient has completed 17 sessions. Her current weight 132.9 down 6 lbs from last 30 day review. She continues to do well in the program with progressions and consistent attendance. She saw Dr. Ellyn Hack for follow up OV 04/22/21. He discontinued her Spironolactone due to no signs of CHf with improved EF. He discontinued Brinlinta and added Effient 10 mg daily due to compliants of SOB thinking maybe Brilinita was causing. She was on ASA but reported dark stools. She has appointment with gastroenterologist for evaluation in March. Her blood pressue continues to be well controlled. She works very hard during sessions. Her personal goals continue to be to get healthier; get her heart stronger; and learn how far to push herself. We will continue to monitor her progress as she works towards meeting these goals.     Expected Outcomes Patient will complete the program meeting both program and personal goals. Patient will complete the program meeting both program and personal goals. Patient will complete the program meeting both program and personal goals.              Core Components/Risk Factors/Patient Goals at Discharge (Final Review):   Goals and Risk Factor Review - 05/05/21 1356       Core Components/Risk Factors/Patient Goals Review   Personal Goals Review Lipids;Other    Review Patient has completed 17 sessions. Her current weight 132.9 down 6 lbs from last 30 day review. She continues to do well in the program with progressions and consistent attendance. She saw Dr. Ellyn Hack for follow up OV 04/22/21. He discontinued her Spironolactone due to no signs of  CHf with improved EF. He discontinued Brinlinta and added Effient 10 mg daily due to compliants of SOB thinking maybe Brilinita was causing. She was on ASA but reported dark stools. She has appointment with gastroenterologist for evaluation in March. Her  blood pressue continues to be well controlled. She works very hard during sessions. Her personal goals continue to be to get healthier; get her heart stronger; and learn how far to push herself. We will continue to monitor her progress as she works towards meeting these goals.    Expected Outcomes Patient will complete the program meeting both program and personal goals.             ITP Comments:   Comments: ITP REVIEW Pt is making expected progress toward Cardiac Rehab goals after completing 20 sessions. Recommend continued exercise, life style modification, education, and increased stamina and strength.

## 2021-05-16 ENCOUNTER — Encounter (HOSPITAL_COMMUNITY)
Admission: RE | Admit: 2021-05-16 | Discharge: 2021-05-16 | Disposition: A | Discharge status: Home / Self Care | Payer: Medicare Other | Source: Ambulatory Visit | Attending: Cardiovascular Disease | Admitting: Cardiovascular Disease

## 2021-05-16 DIAGNOSIS — I213 ST elevation (STEMI) myocardial infarction of unspecified site: Secondary | ICD-10-CM | POA: Diagnosis not present

## 2021-05-16 DIAGNOSIS — Z955 Presence of coronary angioplasty implant and graft: Secondary | ICD-10-CM | POA: Insufficient documentation

## 2021-05-16 NOTE — Progress Notes (Signed)
Daily Session Note  Patient Details  Name: Amber Peterson MRN: [id number] Date of Birth: 04-16-1955 Referring Provider:   Flowsheet Row CARDIAC REHAB PHASE II ORIENTATION from 02/27/2021 in [place]  Referring Provider Dr. Ellyn Hack       Encounter Date: 05/16/2021  Check In:  Session Check In - 05/16/21 1100       Check-In   Supervising physician immediately available to respond to emergencies [facility] MD immediately available    Physician(s) Dr. Harrington Challenger    Location [facility]    Staff Present Redge Gainer, BS, Exercise Physiologist;Leelynd Maldonado Kris Mouton, MS, ACSM-CEP, Exercise Physiologist;Debra Wynetta Emery, RN, BSN    Virtual Visit No    Medication changes reported     No    Fall or balance concerns reported    No    Tobacco Cessation No Change    Warm-up and Cool-down Performed as group-led instruction    Resistance Training Performed Yes    VAD Patient? No    PAD/SET Patient? No      Pain Assessment   Currently in Pain? No/denies    Pain Score 0-No pain    Multiple Pain Sites No             Capillary Blood Glucose: No results found for this or any previous visit (from the past 24 hour(s)).    Social History   Tobacco Use  Smoking Status Never  Smokeless Tobacco Never    Goals Met:  Independence with exercise equipment Exercise tolerated well No report of concerns or symptoms today Strength training completed today  Goals Unmet:  Not Applicable  Comments: checkout time is 1200   Dr. Carlyle Dolly is Medical Director for [facility]

## 2021-05-19 ENCOUNTER — Encounter (HOSPITAL_COMMUNITY)
Admission: RE | Admit: 2021-05-19 | Discharge: 2021-05-19 | Disposition: A | Discharge status: Home / Self Care | Payer: Medicare Other | Source: Ambulatory Visit | Attending: Cardiovascular Disease | Admitting: Cardiovascular Disease

## 2021-05-19 DIAGNOSIS — Z955 Presence of coronary angioplasty implant and graft: Secondary | ICD-10-CM

## 2021-05-19 DIAGNOSIS — I213 ST elevation (STEMI) myocardial infarction of unspecified site: Secondary | ICD-10-CM

## 2021-05-19 NOTE — Progress Notes (Signed)
Daily Session Note  Patient Details  Name: Amber Peterson MRN: [id number] Date of Birth: 10-26-1955 Referring Provider:   Flowsheet Row CARDIAC REHAB PHASE II ORIENTATION from 02/27/2021 in [place]  Referring Provider Dr. Ellyn Hack       Encounter Date: 05/19/2021  Check In:  Session Check In - 05/19/21 1100       Check-In   Supervising physician immediately available to respond to emergencies [facility] MD immediately available    Physician(s) Dr. Audie Box    Location [facility]    Staff Present Aundra Dubin, RN, BSN;Heather Otho Ket, BS, Exercise Physiologist;Kalonji Zurawski Kris Mouton, MS, ACSM-CEP, Exercise Physiologist    Virtual Visit No    Medication changes reported     No    Fall or balance concerns reported    No    Tobacco Cessation No Change    Warm-up and Cool-down Performed as group-led instruction    Resistance Training Performed Yes    VAD Patient? No    PAD/SET Patient? No      Pain Assessment   Currently in Pain? No/denies    Pain Score 0-No pain    Multiple Pain Sites No             Capillary Blood Glucose: No results found for this or any previous visit (from the past 24 hour(s)).    Social History   Tobacco Use  Smoking Status Never  Smokeless Tobacco Never    Goals Met:  Independence with exercise equipment Exercise tolerated well No report of concerns or symptoms today Strength training completed today  Goals Unmet:  Not Applicable  Comments: checkout time is 1200   Dr. Carlyle Dolly is Medical Director for [facility]

## 2021-05-21 ENCOUNTER — Encounter (HOSPITAL_COMMUNITY)
Admission: RE | Admit: 2021-05-21 | Discharge: 2021-05-21 | Disposition: A | Discharge status: Home / Self Care | Payer: Medicare Other | Source: Ambulatory Visit | Attending: Cardiovascular Disease | Admitting: Cardiovascular Disease

## 2021-05-21 DIAGNOSIS — Z955 Presence of coronary angioplasty implant and graft: Secondary | ICD-10-CM | POA: Diagnosis not present

## 2021-05-21 DIAGNOSIS — I213 ST elevation (STEMI) myocardial infarction of unspecified site: Secondary | ICD-10-CM | POA: Diagnosis not present

## 2021-05-21 NOTE — Progress Notes (Signed)
Daily Session Note  Patient Details  Name: Amber Peterson MRN: [id number] Date of Birth: [birth year] Referring Provider:   Flowsheet Row CARDIAC REHAB PHASE II ORIENTATION from 02/27/2021 in [place]  Referring Provider Dr. Ellyn Hack       Encounter Date: 05/21/2021  Check In:  Session Check In - 05/21/21 1100       Check-In   Supervising physician immediately available to respond to emergencies [facility] MD immediately available    Physician(s) Dr. Johnsie Cancel    Location [facility]    Staff Present Aundra Dubin, RN, BSN;Heather Otho Ket, BS, Exercise Physiologist;Deandra Gadson Kris Mouton, MS, ACSM-CEP, Exercise Physiologist    Virtual Visit No    Medication changes reported     No    Fall or balance concerns reported    No    Tobacco Cessation No Change    Warm-up and Cool-down Performed as group-led instruction    Resistance Training Performed Yes    VAD Patient? No    PAD/SET Patient? No      Pain Assessment   Currently in Pain? No/denies    Pain Score 0-No pain    Multiple Pain Sites No             Capillary Blood Glucose: No results found for this or any previous visit (from the past 24 hour(s)).    Social History   Tobacco Use  Smoking Status Never  Smokeless Tobacco Never    Goals Met:  Independence with exercise equipment Exercise tolerated well No report of concerns or symptoms today Strength training completed today  Goals Unmet:  Not Applicable  Comments: checkout time is 1200   Dr. Kathie Dike is Medical Director for [facility].

## 2021-05-23 ENCOUNTER — Encounter (HOSPITAL_COMMUNITY)
Admission: RE | Admit: 2021-05-23 | Discharge: 2021-05-23 | Disposition: A | Discharge status: Home / Self Care | Payer: Medicare Other | Source: Ambulatory Visit | Attending: Cardiovascular Disease | Admitting: Cardiovascular Disease

## 2021-05-23 VITALS — Ht <= 58 in | Wt 132.3 lb

## 2021-05-23 DIAGNOSIS — I213 ST elevation (STEMI) myocardial infarction of unspecified site: Secondary | ICD-10-CM

## 2021-05-23 DIAGNOSIS — Z955 Presence of coronary angioplasty implant and graft: Secondary | ICD-10-CM | POA: Diagnosis not present

## 2021-05-23 NOTE — Progress Notes (Signed)
Daily Session Note  Patient Details  Name: Amber Peterson MRN: [id number] Date of Birth: 07-07-1955 Referring Provider:   Flowsheet Row CARDIAC REHAB PHASE II ORIENTATION from 02/27/2021 in [place]  Referring Provider Dr. Ellyn Hack       Encounter Date: 05/23/2021  Check In:  Session Check In - 05/23/21 1100       Check-In   Supervising physician immediately available to respond to emergencies [facility] MD immediately available    Physician(s) Dr Marlou Porch    Location [facility]    Staff Present Aundra Dubin, RN, Madlyn Frankel, RN, Bjorn Loser, MS, ACSM-CEP, Exercise Physiologist;Heather Zigmund Fany Cavanaugh, Exercise Physiologist    Virtual Visit No    Medication changes reported     No    Fall or balance concerns reported    No    Tobacco Cessation No Change    Warm-up and Cool-down Performed as group-led instruction    Resistance Training Performed Yes    VAD Patient? No    PAD/SET Patient? No      Pain Assessment   Currently in Pain? No/denies    Pain Score 0-No pain    Multiple Pain Sites No             Capillary Blood Glucose: No results found for this or any previous visit (from the past 24 hour(s)).    Social History   Tobacco Use  Smoking Status Never  Smokeless Tobacco Never    Goals Met:  Independence with exercise equipment Exercise tolerated well No report of concerns or symptoms today  Goals Unmet:  Not Applicable  Comments: checkout 1200   Dr. Carlyle Dolly is Medical Director for [facility]

## 2021-05-30 ENCOUNTER — Encounter: Payer: Self-pay | Admitting: Physician Assistant

## 2021-06-03 ENCOUNTER — Telehealth: Payer: Self-pay

## 2021-06-03 ENCOUNTER — Telehealth: Payer: Self-pay | Admitting: Cardiology

## 2021-06-03 NOTE — Telephone Encounter (Signed)
° °  Pre-operative Risk Assessment    Patient Name: Amber Peterson  DOB: [birth year] MRN: [id number]     Request for Surgical Clearance    Procedure:   Cleaning, X-Rays, Extractions, Root Canal Therapy, Fillings, Crowns, Bridges    Date of Surgery:  Clearance TBD                                 Surgeon:  Joycelyn Schmid, DDS Surgeon's Group or Practice Name:  [facility] Phone number:  [phone] Fax number:  [phone]   Type of Clearance Requested:   - Medical  - Pharmacy:  Hold Prasugrel (Effient) Can she stop prior to dental procedure,and if so how many days prior to procedure    Type of Anesthesia:  Not Indicated   Additional requests/questions:   Does she need to take a premed prior to dental procedures?  Signed, Jacqulynn Cadet   06/03/2021, 3:11 PM

## 2021-06-03 NOTE — Telephone Encounter (Signed)
Called the office of [facility] and spoke with Chantel. Asked which procedure will be performed first since there ar multiple treatments marked on form. Also asked for the extraction how many teeth are being extracted. She informed me that patient's next appointment is for a cleaning and x-rays and that from there it will be known how many teeth will be extracted along with the other procedures that are marked on the request form.

## 2021-06-03 NOTE — Telephone Encounter (Signed)
° °  Primary Cardiologist: Glenetta Hew, MD  Chart reviewed as part of pre-operative protocol coverage. Per dental office below, initial visit is for cleaning and x-rays. Simple dental extractions are considered low risk procedures per guidelines and generally do not require any specific cardiac clearance. It is also generally accepted that for simple extractions and dental cleanings, there is no need to interrupt blood thinner therapy.   Dental office will be determining future plans for intervention at which time we recommend submitting a specific request of upcoming procedure so we can more accurately risk stratify and provide recommendations.  SBE prophylaxis is not required for the patient.  I will route this recommendation to the requesting party via Epic fax function and remove from pre-op pool.  Please call with questions.  Lenna Sciara, NP 06/03/2021, 4:03 PM

## 2021-06-03 NOTE — Telephone Encounter (Signed)
Spoke with patient who asked about dental appointment and the medication she taking prasugrel. I gave her the clinic phone and fax numbers so her dentist can send a clearance request.

## 2021-06-03 NOTE — Telephone Encounter (Signed)
New Message:    Patient said he dentist office called and said they had a cancellation for today. Her concern is that she is on Prasugrel. Should she stop taking this before she have dental work?

## 2021-06-05 NOTE — Telephone Encounter (Signed)
Shantel from Dr. Gildardo Pounds, [place] office called to clarify clearance that was sent back pt is cleared for the cleaning and x-rays, but not the other Tx plans. I explained the cardiologist will not provide a blanket type clearance due to blood thinner. We cannot hold blood thinners for an extended period of time without putting pt at risk from stroke or blood clot. I stated that the pt is cleared at this point for the Tx plan for the cleaning and the x-ray and no need for SBE. I informed that when ready for the next Tx plan which sounds like extractions, that we will need a new clearance request. I did ask if extractions please let us know how many teeth are being extracted and not the number of the tooth location. Also will need to now if simple or surgical extractions, if any anesthesia being used, local etc. Shantel gave verbal understanding to our pre op protocol for the cardiologist.

## 2021-06-09 NOTE — Progress Notes (Signed)
Discharge Progress Report  Patient Details  Name: Amber Peterson MRN: [id number] Date of Birth: [birth year] Referring Provider:   Flowsheet Row CARDIAC REHAB PHASE II ORIENTATION from 02/27/2021 in [place]  Referring Provider Dr. Ellyn Hack        Number of Visits: 24  Reason for Discharge:  Patient reached a stable level of exercise. Patient independent in their exercise. Patient has met program and personal goals.  Smoking History:  Social History   Tobacco Use  Smoking Status Never  Smokeless Tobacco Never    Diagnosis:  ST elevation myocardial infarction (STEMI), unspecified artery (HCC)  S/P coronary artery stent placement  ADL UCSD:   Initial Exercise Prescription:  Initial Exercise Prescription - 02/27/21 1000       Date of Initial Exercise RX and Referring Provider   Date 02/27/21    Referring Provider Dr. Ellyn Hack    Expected Discharge Date 05/23/21      Treadmill   MPH 1.5    Grade 0    Minutes 17      NuStep   Level 1    SPM 60    Minutes 22      Prescription Details   Frequency (times per week) 3    Duration Progress to 30 minutes of continuous aerobic without signs/symptoms of physical distress      Intensity   THRR 40-80% of Max Heartrate 62-123    Ratings of Perceived Exertion 11-13    Perceived Dyspnea 0-4      Resistance Training   Training Prescription Yes    Weight 3    Reps 10-15             Discharge Exercise Prescription (Final Exercise Prescription Changes):  Exercise Prescription Changes - 05/12/21 1100       Response to Exercise   Blood Pressure (Admit) 100/50    Blood Pressure (Exercise) 112/70    Blood Pressure (Exit) 100/64    Heart Rate (Admit) 73 bpm    Heart Rate (Exercise) 120 bpm    Heart Rate (Exit) 82 bpm    Rating of Perceived Exertion (Exercise) 13    Duration Continue with 30 min of aerobic exercise without signs/symptoms of physical distress.    Intensity THRR unchanged       Progression   Progression Continue to progress workloads to maintain intensity without signs/symptoms of physical distress.      Resistance Training   Training Prescription Yes    Weight 3    Reps 10-15    Time 10 Minutes      Treadmill   MPH 2.6    Grade 3    Minutes 17    METs 4.07      NuStep   Level 1    SPM 104    Minutes 22    METs 2.77             Functional Capacity:  6 Minute Walk     Row Name 02/27/21 1049 05/23/21 1336       6 Minute Walk   Phase Initial Discharge    Distance 1200 feet 1350 feet    Walk Time 6 minutes 6 minutes    # of Rest Breaks 0 0    MPH 2.3 2.6    METS 2.58 3.01    RPE 11 12    VO2 Peak 8.83 10.54    Symptoms No No    Resting HR 83 bpm 81 bpm  Resting BP 98/60 110/60    Resting Oxygen Saturation  98 % 98 %    Exercise Oxygen Saturation  during 6 min walk 99 % 97 %    Max Ex. HR 94 bpm 114 bpm    Max Ex. BP 115/65 115/68    2 Minute Post BP 96/60 105/60             Psychological, QOL, Others - Outcomes: PHQ 2/9: Depression screen [facility] 2/9 06/09/2021 02/27/2021  Decreased Interest 1 1  Down, Depressed, Hopeless 1 1  PHQ - 2 Score 2 2  Altered sleeping 1 0  Tired, decreased energy 1 1  Change in appetite 2 2  Feeling bad or failure about yourself  0 1  Trouble concentrating 1 2  Moving slowly or fidgety/restless 0 0  Suicidal thoughts 0 0  PHQ-9 Score 7 8  Difficult doing work/chores Not difficult at all Not difficult at all    Quality of Life:  Quality of Life - 05/23/21 1338       Quality of Life   Select Quality of Life      Quality of Life Scores   Health/Function Pre 24.81 %    Health/Function Post 28.07 %    Health/Function % Change 13.14 %    Socioeconomic Pre 24.21 %    Socioeconomic Post 27.8 %    Socioeconomic % Change  14.83 %    Psych/Spiritual Pre 25.5 %    Psych/Spiritual Post 30 %    Psych/Spiritual % Change 17.65 %    Family Pre 27.6 %    Family Post 30 %    Family % Change  8.7 %    GLOBAL Pre 25.27 %    GLOBAL Post 28.71 %    GLOBAL % Change 13.61 %             Personal Goals: Goals established at orientation with interventions provided to work toward goal.  Personal Goals and Risk Factors at Admission - 02/27/21 0912       Core Components/Risk Factors/Patient Goals on Admission    Weight Management Weight Maintenance    Lipids Yes    Intervention Provide education and support for participant on nutrition & aerobic/resistive exercise along with prescribed medications to achieve LDL <20m, HDL >458m    Expected Outcomes Short Term: Participant states understanding of desired cholesterol values and is compliant with medications prescribed. Participant is following exercise prescription and nutrition guidelines.;Long Term: Cholesterol controlled with medications as prescribed, with individualized exercise RX and with personalized nutrition plan. Value goals: LDL < 7064mHDL > 40 mg.    Personal Goal Other Yes    Personal Goal Patient wants to get both herself stronger and her heart stronger. She want to learn what she can do and how far to push herself. She want to get healthier overall.    Intervention Patient will attend CR 3 days/week and supplement with exercise 2 days/week.    Expected Outcomes Patient will complete the program meeting both personal and program goals.              Personal Goals Discharge:  Goals and Risk Factor Review     Row Name 03/12/21 1309 04/03/21 1507 05/05/21 1356 06/09/21 0841       Core Components/Risk Factors/Patient Goals Review   Personal Goals Review Lipids;Other Lipids;Other Lipids;Other Lipids;Other    Review Patient was referred to CR with STEMI and DES. She is new to the program completing 2 sessions.  She continues to wear a life vest for low EF with a repeat echo scheduled in December. Her personal goals for the program are to get healthier; get her heart stronger; learn how far to push herself. We will  continue to monitor her progress as she works towards meeting these goals. Patient has completed 9 sessions. Her initial weight was 138.0 lbs and her current weight is 138.0 lbs. She is doing well in the program with progressions and consistent attendance. She is currently out with COVID with mild symptoms waiting for treatment from PCP. 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 Her EF is back to normal at 55 to 60% and her life vest was discontinued. Patient is very happy about this. She has a follow up OV with Dr. Ellyn Hack 04/22/21. Her blood pressue is well controlled. Her personal goals continue to be to get healthier; get her heart stronger; and learn how far to push herself. We will continue to monitor her progress as she works towards meeting these goals. Patient has completed 17 sessions. Her current weight 132.9 down 6 lbs from last 30 day review. She continues to do well in the program with progressions and consistent attendance. She saw Dr. Ellyn Hack for follow up OV 04/22/21. He discontinued her Spironolactone due to no signs of CHf with improved EF. He discontinued Brinlinta and added Effient 10 mg daily due to compliants of SOB thinking maybe Brilinita was causing. She was on ASA but reported dark stools. She has appointment with gastroenterologist for evaluation in March. Her blood pressue continues to be well controlled. She works very hard during sessions. Her personal goals continue to be to get healthier; get her heart stronger; and learn how far to push herself. We will continue to monitor her progress as she works towards meeting these goals. Pt graduated CR after 24 sessions. She lost 6.3 lbs during her time in the program. She did well in the program and had consistent attendance. Her motivation was lacking at times, but she was able to progress in the program. All vitals were WNLs while in the program. She has now began exercising 5 days per week at the [facility] and has a good  understanding of her workout regiment.    Expected Outcomes Patient will complete the program meeting both program and personal goals. Patient will complete the program meeting both program and personal goals. Patient will complete the program meeting both program and personal goals. Pt will continue to work towards their goals post discharge.             Exercise Goals and Review:  Exercise Goals     Row Name 02/27/21 1052 03/18/21 [id number] 04/15/21 1424 05/13/21 1251       Exercise Goals   Increase Physical Activity Yes Yes Yes Yes    Intervention Provide advice, education, support and counseling about physical activity/exercise needs.;Develop an individualized exercise prescription for aerobic and resistive training based on initial evaluation findings, risk stratification, comorbidities and participant's personal goals. Provide advice, education, support and counseling about physical activity/exercise needs.;Develop an individualized exercise prescription for aerobic and resistive training based on initial evaluation findings, risk stratification, comorbidities and participant's personal goals. Provide advice, education, support and counseling about physical activity/exercise needs.;Develop an individualized exercise prescription for aerobic and resistive training based on initial evaluation findings, risk stratification, comorbidities and participant's personal goals. Provide advice, education, support and counseling about physical activity/exercise needs.;Develop an individualized exercise prescription for aerobic and  resistive training based on initial evaluation findings, risk stratification, comorbidities and participant's personal goals.    Expected Outcomes Short Term: Attend rehab on a regular basis to increase amount of physical activity.;Long Term: Add in home exercise to make exercise part of routine and to increase amount of physical activity.;Long Term: Exercising regularly at least  3-5 days a week. Short Term: Attend rehab on a regular basis to increase amount of physical activity.;Long Term: Add in home exercise to make exercise part of routine and to increase amount of physical activity.;Long Term: Exercising regularly at least 3-5 days a week. Short Term: Attend rehab on a regular basis to increase amount of physical activity.;Long Term: Add in home exercise to make exercise part of routine and to increase amount of physical activity.;Long Term: Exercising regularly at least 3-5 days a week. Short Term: Attend rehab on a regular basis to increase amount of physical activity.;Long Term: Add in home exercise to make exercise part of routine and to increase amount of physical activity.;Long Term: Exercising regularly at least 3-5 days a week.    Increase Strength and Stamina Yes Yes Yes Yes    Intervention Provide advice, education, support and counseling about physical activity/exercise needs.;Develop an individualized exercise prescription for aerobic and resistive training based on initial evaluation findings, risk stratification, comorbidities and participant's personal goals. Provide advice, education, support and counseling about physical activity/exercise needs.;Develop an individualized exercise prescription for aerobic and resistive training based on initial evaluation findings, risk stratification, comorbidities and participant's personal goals. Provide advice, education, support and counseling about physical activity/exercise needs.;Develop an individualized exercise prescription for aerobic and resistive training based on initial evaluation findings, risk stratification, comorbidities and participant's personal goals. Provide advice, education, support and counseling about physical activity/exercise needs.;Develop an individualized exercise prescription for aerobic and resistive training based on initial evaluation findings, risk stratification, comorbidities and participant's  personal goals.    Expected Outcomes Short Term: Increase workloads from initial exercise prescription for resistance, speed, and METs.;Short Term: Perform resistance training exercises routinely during rehab and add in resistance training at home;Long Term: Improve cardiorespiratory fitness, muscular endurance and strength as measured by increased METs and functional capacity (6MWT) Short Term: Increase workloads from initial exercise prescription for resistance, speed, and METs.;Short Term: Perform resistance training exercises routinely during rehab and add in resistance training at home;Long Term: Improve cardiorespiratory fitness, muscular endurance and strength as measured by increased METs and functional capacity (6MWT) Short Term: Increase workloads from initial exercise prescription for resistance, speed, and METs.;Short Term: Perform resistance training exercises routinely during rehab and add in resistance training at home;Long Term: Improve cardiorespiratory fitness, muscular endurance and strength as measured by increased METs and functional capacity (6MWT) Short Term: Increase workloads from initial exercise prescription for resistance, speed, and METs.;Short Term: Perform resistance training exercises routinely during rehab and add in resistance training at home;Long Term: Improve cardiorespiratory fitness, muscular endurance and strength as measured by increased METs and functional capacity (6MWT)    Able to understand and use rate of perceived exertion (RPE) scale Yes Yes Yes Yes    Intervention Provide education and explanation on how to use RPE scale Provide education and explanation on how to use RPE scale Provide education and explanation on how to use RPE scale Provide education and explanation on how to use RPE scale    Expected Outcomes Short Term: Able to use RPE daily in rehab to express subjective intensity level;Long Term:  Able to use RPE to guide intensity level  when exercising  independently Short Term: Able to use RPE daily in rehab to express subjective intensity level;Long Term:  Able to use RPE to guide intensity level when exercising independently Short Term: Able to use RPE daily in rehab to express subjective intensity level;Long Term:  Able to use RPE to guide intensity level when exercising independently Short Term: Able to use RPE daily in rehab to express subjective intensity level;Long Term:  Able to use RPE to guide intensity level when exercising independently    Knowledge and understanding of Target Heart Rate Range (THRR) Yes Yes Yes Yes    Intervention Provide education and explanation of THRR including how the numbers were predicted and where they are located for reference Provide education and explanation of THRR including how the numbers were predicted and where they are located for reference Provide education and explanation of THRR including how the numbers were predicted and where they are located for reference Provide education and explanation of THRR including how the numbers were predicted and where they are located for reference    Expected Outcomes Short Term: Able to state/look up THRR;Short Term: Able to use daily as guideline for intensity in rehab;Long Term: Able to use THRR to govern intensity when exercising independently Short Term: Able to state/look up THRR;Short Term: Able to use daily as guideline for intensity in rehab;Long Term: Able to use THRR to govern intensity when exercising independently Short Term: Able to state/look up THRR;Short Term: Able to use daily as guideline for intensity in rehab;Long Term: Able to use THRR to govern intensity when exercising independently Short Term: Able to state/look up THRR;Short Term: Able to use daily as guideline for intensity in rehab;Long Term: Able to use THRR to govern intensity when exercising independently    Able to check pulse independently Yes Yes Yes Yes    Intervention Provide education and  demonstration on how to check pulse in carotid and radial arteries.;Review the importance of being able to check your own pulse for safety during independent exercise Provide education and demonstration on how to check pulse in carotid and radial arteries.;Review the importance of being able to check your own pulse for safety during independent exercise Provide education and demonstration on how to check pulse in carotid and radial arteries.;Review the importance of being able to check your own pulse for safety during independent exercise Provide education and demonstration on how to check pulse in carotid and radial arteries.;Review the importance of being able to check your own pulse for safety during independent exercise    Expected Outcomes Short Term: Able to explain why pulse checking is important during independent exercise;Long Term: Able to check pulse independently and accurately Short Term: Able to explain why pulse checking is important during independent exercise;Long Term: Able to check pulse independently and accurately Short Term: Able to explain why pulse checking is important during independent exercise;Long Term: Able to check pulse independently and accurately Short Term: Able to explain why pulse checking is important during independent exercise;Long Term: Able to check pulse independently and accurately    Understanding of Exercise Prescription Yes Yes Yes Yes    Intervention Provide education, explanation, and written materials on patient's individual exercise prescription Provide education, explanation, and written materials on patient's individual exercise prescription Provide education, explanation, and written materials on patient's individual exercise prescription Provide education, explanation, and written materials on patient's individual exercise prescription    Expected Outcomes Short Term: Able to explain program exercise prescription;Long Term: Able to explain  home exercise  prescription to exercise independently Short Term: Able to explain program exercise prescription;Long Term: Able to explain home exercise prescription to exercise independently Short Term: Able to explain program exercise prescription;Long Term: Able to explain home exercise prescription to exercise independently Short Term: Able to explain program exercise prescription;Long Term: Able to explain home exercise prescription to exercise independently             Exercise Goals Re-Evaluation:  Exercise Goals Re-Evaluation     Row Name 03/18/21 [id number] 04/15/21 1425 05/13/21 1251         Exercise Goal Re-Evaluation   Exercise Goals Review Increase Physical Activity;Increase Strength and Stamina;Able to understand and use rate of perceived exertion (RPE) scale;Knowledge and understanding of Target Heart Rate Range (THRR);Able to check pulse independently;Understanding of Exercise Prescription Increase Physical Activity;Increase Strength and Stamina;Able to understand and use rate of perceived exertion (RPE) scale;Knowledge and understanding of Target Heart Rate Range (THRR);Able to check pulse independently;Understanding of Exercise Prescription Increase Physical Activity;Increase Strength and Stamina;Able to understand and use rate of perceived exertion (RPE) scale;Knowledge and understanding of Target Heart Rate Range (THRR);Able to check pulse independently;Understanding of Exercise Prescription     Comments Pt has completed 5 sessions of cardiac rehab. She is motivated during class. She is currently exercising at 2.38 METs on the TM. Will continue to monitor and progress as able. Pt has completed 9 sessions of cardiac rehab. She last attended 03/28/2021. She has had covid since then and has been too sick to come in. She is set to return 04/16/2020. She was exercising at 2.77 METs on the treadmill. Will continue to monitor and progress as able. Pt has completed 20 sessions of cardiac rehab. She has been  progressing well in the program. Some days she pushes herself and other days she does not give much effort. She is currently exercising at 4.07 METs. Will continue to monitor and progress as able.     Expected Outcomes Through exercise at rehab and at home, the patient will meet their stated goals. Through exercise at rehab and at home, the patient will meet their stated goals. Through exercise at rehab and at home, the patient will meet their stated goals.              Nutrition & Weight - Outcomes:  Pre Biometrics - 02/27/21 1052       Pre Biometrics   Height '4\' 10"'  (1.473 m)    Weight 138 lb 7.2 oz (62.8 kg)    Waist Circumference 35.6 inches    Hip Circumference 38 inches    Waist to Hip Ratio 0.94 %    BMI (Calculated) 28.94    Triceps Skinfold 13 mm    % Body Fat 36.8 %    Grip Strength 20.6 kg    Flexibility 13 in    Single Leg Stand 60 seconds             Post Biometrics - 05/23/21 1337        Post  Biometrics   Height '4\' 10"'  (1.473 m)    Weight 132 lb 4.4 oz (60 kg)    Waist Circumference 36 inches    Hip Circumference 36 inches    Waist to Hip Ratio 1 %    BMI (Calculated) 27.65    Triceps Skinfold 10 mm    % Body Fat 34.9 %    Grip Strength 27 kg    Flexibility 11.5 in    Single Leg  Stand 60 seconds             Nutrition:  Nutrition Therapy & Goals - 02/27/21 0910       Personal Nutrition Goals   Comments Patient scored 13 on her diet assessment. Handout discussed and provided regarding healthier choices. We offer 2 educational sessions on heart healthy nutrition with handouts. She says she follows a low sodium diet no more that 2000 mg/day and does not eat any red meat.      Intervention Plan   Intervention Nutrition handout(s) given to patient.    Expected Outcomes Short Term Goal: Understand basic principles of dietary content, such as calories, fat, sodium, cholesterol and nutrients.             Nutrition Discharge:  Nutrition  Assessments - 06/09/21 0836       MEDFICTS Scores   Pre Score 13    Post Score 6    Score Difference -7             Education Questionnaire Score:  Knowledge Questionnaire Score - 06/09/21 0837       Knowledge Questionnaire Score   Pre Score 24/28    Post Score 19/24             Goals reviewed with patient; copy given to patient. Pt graduated from CR after 24 sessions. She was able to improve her walk test distance by 29.2%. Her MET level at graduation was 4.12. She reports she will continue to exercise by going to the [facility] five days per week.

## 2021-06-11 DIAGNOSIS — E782 Mixed hyperlipidemia: Secondary | ICD-10-CM | POA: Diagnosis not present

## 2021-06-11 DIAGNOSIS — E0590 Thyrotoxicosis, unspecified without thyrotoxic crisis or storm: Secondary | ICD-10-CM | POA: Diagnosis not present

## 2021-06-12 ENCOUNTER — Ambulatory Visit: Payer: Medicare Other | Admitting: Physician Assistant

## 2021-06-12 ENCOUNTER — Encounter: Payer: Self-pay | Admitting: Physician Assistant

## 2021-06-12 ENCOUNTER — Telehealth: Payer: Self-pay

## 2021-06-12 VITALS — BP 90/64 | HR 96 | Ht <= 58 in | Wt 133.5 lb

## 2021-06-12 DIAGNOSIS — R195 Other fecal abnormalities: Secondary | ICD-10-CM | POA: Diagnosis not present

## 2021-06-12 DIAGNOSIS — Z8719 Personal history of other diseases of the digestive system: Secondary | ICD-10-CM | POA: Diagnosis not present

## 2021-06-12 MED ORDER — OMEPRAZOLE 40 MG PO CPDR: 40.0000 mg | DELAYED_RELEASE_CAPSULE | Freq: Every day | ORAL | 6 refills | Status: DC

## 2021-06-12 MED ORDER — NA SULFATE-K SULFATE-MG SULF 17.5-3.13-1.6 GM/177ML PO SOLN: 1.0000 | Freq: Once | ORAL | 0 refills | Status: AC

## 2021-06-12 NOTE — Patient Instructions (Signed)
If you are age 67 or older, your body mass index should be between 23-30. Your Body mass index is 27.9 kg/m?Marland Kitchen If this is out of the aforementioned range listed, please consider follow up with your Primary Care Provider. ?________________________________________________________ ? ?The [facility] providers would like to encourage you to use [facility] to communicate with providers for non-urgent requests or questions.  Due to long hold times on the telephone, sending your provider a message by [facility] may be a faster and more efficient way to get a response.  Please allow 48 business hours for a response.  Please remember that this is for non-urgent requests.  ?_______________________________________________________ ? ?You have been scheduled for an endoscopy and colonoscopy. Please follow the written instructions given to you at your visit today. ?Please pick up your prep supplies at the pharmacy within the next 1-3 days. ?If you use inhalers (even only as needed), please bring them with you on the day of your procedure. ? ?You will be contacted by our office prior to your procedure for directions on holding your Effient.  If you do not hear from our office 1 week prior to your scheduled procedure, please call [phone] to discuss.  ? ?START Omeprazole 40 mg 1 capsule prior to breakfast. ? ?Thank you for entrusting me with your care and choosing [facility]. ? ?Nicoletta Ba, PA-C ?

## 2021-06-12 NOTE — Progress Notes (Signed)
Subjective:    Patient ID: Amber Peterson, female    DOB: 06-May-1955, 66 y.o.   MRN: [id number]  HPI Amber Peterson is a pleasant 66 year old white female, new to GI today referred by Dr. Harding/cardiology due to concerns with episodes of dark tarry stools.  Patient reports that she did have 1 remote colonoscopy at age 36 that was done by Dr. Thalia Party and was negative.  She has not ever required EGD. Patient had an MI in August 2022 with drug-eluting stent placement to the LAD and left circumflex.  She was started on Effient and baby aspirin.  Course was complicated by ischemic cardiomyopathy, also has history of hypertension, asthma, anxiety and prior history of hepatitis C for which she was treated with eradication. Patient says she first started noticing black stools probably in October /November-she called cardiology and was told to stop her baby aspirin.  She says when she stopped the aspirin she did not notice any further dark or black stools and was then asked to resume the aspirin again.  She went back to having black stools and in January when off aspirin again has continued on Effient.  She has not seen any further black stools since mid January.  At present stools have been brown and normal, no red blood, she has not had any complaints of abdominal discomfort or pain, no nausea or vomiting no heartburn or indigestion, no dysphagia or odynophagia.  She is not taking any NSAIDs. She also mentions that she did a Cologuard stool DNA test in the fall which returned positive.  She had labs done through her PCP just yesterday with hemoglobin 15.1/hematocrit 46.1/platelets 121/MCV 99.  She says she has had low platelets for a long time.  She also had recent echo done in December 2022 with improved EF to 55 to 60% no AS.    Review of Systems Pertinent positive and negative review of systems were noted in the above HPI section.  All other review of systems was otherwise negative.   Outpatient  Encounter Medications as of 06/12/2021  Medication Sig   acetaminophen (TYLENOL) 500 MG tablet Take 2,000 mg by mouth as needed for moderate pain.   albuterol (VENTOLIN HFA) 108 (90 Base) MCG/ACT inhaler Inhale 2 puffs into the lungs every 6 (six) hours as needed for wheezing or shortness of breath.   ALPRAZolam (XANAX) 0.5 MG tablet Take 0.5 mg by mouth 2 (two) times daily.   Ascorbic Acid (VITAMIN C PO) Take 1 tablet by mouth daily.   atorvastatin (LIPITOR) 40 MG tablet Take 1 tablet (40 mg total) by mouth daily.   Biotin 1000 MCG tablet Take 1,000 mcg by mouth daily.   budesonide-formoterol (SYMBICORT) 160-4.5 MCG/ACT inhaler Inhale 2 puffs into the lungs 2 (two) times daily.   Cholecalciferol (DIALYVITE VITAMIN D 5000 PO) Take 5,000 Units by mouth daily.   gabapentin (NEURONTIN) 300 MG capsule Take 1 capsule (300 mg total) by mouth 3 (three) times daily. (Patient taking differently: Take 300 mg by mouth 3 (three) times daily as needed (pain).)   HYDROcodone-acetaminophen (NORCO/VICODIN) 5-325 MG tablet Take 1 tablet by mouth every 8 (eight) hours.   hydrocortisone cream 1 % Apply 1 application topically daily as needed for itching.   metoprolol succinate (TOPROL-XL) 25 MG 24 hr tablet Take 1 tablet (25 mg total) by mouth daily.   Na Sulfate-K Sulfate-Mg Sulf 17.5-3.13-1.6 GM/177ML SOLN Take 1 kit by mouth once for 1 dose.   omeprazole (PRILOSEC) 40 MG capsule Take  1 capsule (40 mg total) by mouth daily before breakfast.   prasugrel (EFFIENT) 10 MG TABS tablet Take 1 tablet (10 mg total) by mouth daily. Do NOT take with Brilinta.   sacubitril-valsartan (ENTRESTO) 24-26 MG Take 1 tablet by mouth 2 (two) times daily.   spironolactone (ALDACTONE) 25 MG tablet Take 12.5 mg by mouth daily.   Tetrahydrozoline HCl (VISINE OP) Place 1 drop into both eyes daily as needed (dry eyes).   thyroid (ARMOUR) 15 MG tablet Take 15 mg by mouth daily.   traZODone (DESYREL) 50 MG tablet Take 150 mg by mouth at  bedtime.   venlafaxine XR (EFFEXOR-XR) 150 MG 24 hr capsule Take 150 mg by mouth daily with breakfast.   vitamin B-12 (CYANOCOBALAMIN) 1000 MCG tablet Take 1,000 mcg by mouth daily.   zinc gluconate 50 MG tablet Take 50 mg by mouth daily.   nitroGLYCERIN (NITROSTAT) 0.4 MG SL tablet Place 1 tablet (0.4 mg total) under the tongue every 5 (five) minutes as needed for chest pain. (Patient not taking: Reported on 06/12/2021)   No facility-administered encounter medications on file as of 06/12/2021.   Allergies  Allergen Reactions   Shellfish Allergy Anaphylaxis   Other Other (See Comments)    Allergic to hackleberrys per allergy test Walnuts cause asthma/shortness of breath   Penicillins Hives and Itching    Did it involve swelling of the face/tongue/throat, SOB, or low BP? No Did it involve sudden or severe rash/hives, skin peeling, or any reaction on the inside of your mouth or nose? No Did you need to seek medical attention at a hospital or doctor's office? No When did it last happen?      67 years old If all above answers are "NO", may proceed with cephalosporin use.     Sulfa Antibiotics Hives and Itching   Tape     Causes blisters    Patient Active Problem List   Diagnosis Date Noted   Atherosclerosis of aorta (HCC) 04/24/2021   Shortness of breath 04/24/2021   Ischemic cardiomyopathy 12/10/2020   Essential hypertension 12/09/2020   Presence of drug coated stent in LAD coronary artery 12/09/2020   Hyperlipidemia with target LDL less than 70 12/09/2020   GAD (generalized anxiety disorder) - with depression 12/09/2020   ST elevation myocardial infarction (STEMI) of anterior wall, subsequent episode of care (HCC) 12/08/2020   2 Vessel CAD: s/p DES PCI of LAD & LCx-OM2 12/08/2020   Glenoid fracture of shoulder, right, closed, initial encounter 03/22/2019   Colon cancer screening 12/26/2018   Hepatitis C 07/22/2017   Asthma 07/22/2017   Social History   Socioeconomic History    Marital status: Divorced    Spouse name: Not on file   Number of children: 1   Years of education: Not on file   Highest education level: Not on file  Occupational History   Occupation: retired  Tobacco Use   Smoking status: Former    Types: Cigarettes    Quit date: 1988    Years since quitting: 35.1   Smokeless tobacco: Never  Vaping Use   Vaping Use: Never used  Substance and Sexual Activity   Alcohol use: Not Currently    Comment: occasional   Drug use: Never   Sexual activity: Not on file  Other Topics Concern   Not on file  Social History Narrative   Not on file   Social Determinants of Health   Financial Resource Strain: Not on file  Food Insecurity: Not on  file  Transportation Needs: Not on file  Physical Activity: Not on file  Stress: Not on file  Social Connections: Not on file  Intimate Partner Violence: Not on file    Ms. Haupt's family history includes Angina in her mother; Depression in her mother; Diabetes in her maternal aunt and sister; Heart attack in her father; Heart disease in her maternal grandmother and paternal grandmother; Hyperlipidemia in her maternal grandfather; Stroke in her father.      Objective:    Vitals:   06/12/21 1354  BP: 90/64  Pulse: 96    Physical Exam Well-developed well-nourished folder WF in no acute distress.  Height, ONGEXB,284  BMI27.9  HEENT; nontraumatic normocephalic, EOMI, PE R LA, sclera anicteric. Oropharynx;not examined Neck; supple, no JVD Cardiovascular; regular rate and rhythm with S1-S2, no murmur rub or gallop Pulmonary; Clear bilaterally Abdomen; soft, nontender, nondistended, no palpable mass or hepatosplenomegaly, bowel sounds are active, low midline scar Rectal;not done Skin; benign exam, no jaundice rash or appreciable lesions Extremities; no clubbing cyanosis or edema skin warm and dry Neuro/Psych; alert and oriented x4, grossly nonfocal mood and affect appropriate        Assessment & Plan:    #30 66 year old white female with intermittent black/tarry stools since October 2022, occurred while on Effient and aspirin, resolved symptoms off baby aspirin and then recurred when placed back on baby aspirin and now off aspirin since January and no further dark stools. Hemoglobin yesterday normal at 15.1.  Etiology of melenic stool is not clear, rule out gastropathy, peptic ulcer disease, AVMs, occult neoplasm  #2 positive Cologuard October 2022-patient had negative screening colonoscopy 14 years ago, no follow-up since-rule out occult colonic neoplasm  #3 history of hepatitis C, treated and eradicated-negative hep C RNA quant October 2020  #4 chronic thrombocytopenia #5 status post MI August 2022 with drug-eluting stent LAD and left circumflex on Effient and aspirin  #6 hypertension #7.  Ischemic cardiomyopathy resolved EF 55 to 60% as of December 2022 #8 asthma  Plan; patient will be scheduled for colonoscopy and EGD with Dr. Meridee Score..  Both procedures were discussed in detail with the patient including indications risk and benefits and she is agreeable to proceed. We will start omeprazole 40 mg 1 p.o. every morning Patient will need to hold Effient for 7 days prior to procedures.  Will communicate with her cardiologist Dr. Herbie Baltimore, to assure this is appropriate for this patient.  She is willing to take baby aspirin during the time she is off Effient, and suggested that that would be a good idea.  Patient will benefit from upper abdominal ultrasound, assess liver have an history of hepatitis C and chronic thrombocytopenia.  This can be scheduled after she has endoscopic evaluation  Caryl Fate Oswald Hillock PA-C 06/12/2021   Cc: Benita Stabile, MD

## 2021-06-12 NOTE — Telephone Encounter (Signed)
[facility] Pre-operative Risk Assessment  ?   ?Request for surgical clearance:     Endoscopy Procedure ? ?What type of surgery is being performed?     Endoscopy/Colonoscopy ? ?When is this surgery scheduled?     07/03/2021 ? ?What type of clearance is required ?   Pharmacy ? ?Are there any medications that need to be held prior to surgery and how long?  Effient starting 5 days prior, but replacing with ASA 81 mg ? ?Practice name and name of physician performing surgery?      [facility] ? ?What is your office phone and fax number?      Phone- [phone]  Fax- [phone] ? ?Anesthesia type (None, local, MAC, general) ?       [facility] ? ?

## 2021-06-12 NOTE — Telephone Encounter (Signed)
Amber Peterson. Doster "[name]" 66 year old female is requesting preoperative cardiac evaluation for endoscopy/colonoscopy.  She was last seen in the clinic on 04/22/2021.  During that time she noted difficulty with her breathing which was felt to be related to Brilinta.  Her Brilinta was transitioned to Effient. ? ? ?Her PMH includes essential hypertension, hyperlipidemia, ischemic cardiomyopathy EF 30-35% with improvement to 55 to 60% on subsequent echocardiogram, shortness of breath, aortic atherosclerosis, and STEMI.  She underwent staged PCI on 12/08/2020 with PCI and DES x1 to her LAD and repeat LHC on 12/10/2020 with PCI and DES x2 to her mid circumflex and second marginal. ?  ? ?May her Effient be held if she is started on ASA? ? ?Thank you for your help.  Please direct your response to [facility] preop pool. ? ? ? ?Amber Peterson. Amber Ruehl NP-C ? ?  ?06/12/2021, 3:44 PM ?[facility] ?[address] ?Office [phone] Fax [phone] ? ?

## 2021-06-13 NOTE — Progress Notes (Signed)
Attending Physician's Attestation  ? ?I have reviewed the chart.  ? ?I agree with the Advanced Practitioner's note, impression, and recommendations with any updates as below. ?Not an easy situation in the setting of her recent catheterization with drug-eluting stent in August 2022.  Agree that if cardiology is amenable to hold of antiplatelet therapy for 5 to 7 days, then aspirin can be used in the interim.  If there is concern about holding her antiplatelet therapy even though she has had more than 6 months of it, then diagnostic procedure certainly can be considered though polypectomy would not be able to be performed.  Appreciate cardiology helping [place] understand ability to hold antiplatelet therapy in the periprocedural period but. ? ? ?Justice Britain, MD ?[facility] ?Advanced Endoscopy ?Office # [phone] ? ?

## 2021-06-13 NOTE — Telephone Encounter (Signed)
I called and spoke with the patient this afternoon.  We discussed the role of endoscopic evaluation from above and below in the setting of her positive Cologuard and dark stools that she is experienced previously.  Thankfully her hemoglobin is normal.  At this point in time we decided upon moving forward with diagnostic procedures EGD/colonoscopy on Effient.  She understands that we can take biopsies on this medication however if we find a polyp or something that requires more than just biopsies, we will not be able to remove or do that type of intervention.  She is willing to undertake the risks of needing another procedure if necessary to ensure that there is nothing else that is being missed. ?Thus, we will move forward with our currently planned procedure while on Effient. ?I will forward this to the cardiology team and my team as well. ? ?Justice Britain, MD ?[facility] ?Advanced Endoscopy ?Office # [phone] ?

## 2021-06-13 NOTE — Telephone Encounter (Addendum)
Preoperative team, patient had MI with stent placement 8/22.  Case was reviewed by primary cardiologist and unless the procedure is urgent he would like her to postpone endoscopy/colonoscopy until she has been on her antiplatelet therapy for over 1 year.  Interruption of her antiplatelet therapy may cause premature stent failure.  She will be eligible for procedures 12/10/21.  Please contact requesting office and patient to review Dr. Allison Quarry response.  Thank you for your help. ? ? ? ?Jossie Ng. Latanya Hemmer NP-C ? ?  ?06/13/2021, 12:44 PM ?[facility] ?[address] ?Office [phone] Fax [phone] ? ?

## 2021-06-13 NOTE — Telephone Encounter (Signed)
She had an MI in August 2022.  Would prefer to get this close to 1 year out from her PCI.  At this point, unless biopsy is urgent, best case would be to wait until August before considering stopping Effient. ? ?If the procedure is urgent for diagnostic purposes, then understanding that there is risk of stent thrombosis by holding Effient, at least if he had her holding Effient and take aspirin there is less risk. ? ?Glenetta Hew, MD ? ?

## 2021-06-13 NOTE — Telephone Encounter (Signed)
Call placed to [facility], Cherylann Parr, CMA, was with a pt.  I will send this note to her and she can let us know if this is urgent or not. ?

## 2021-06-14 NOTE — Telephone Encounter (Signed)
Thanks for the update --> if you were to find a suspicious polyp on initial study, ? Would it be feasible to delay polypectomy for a couple weeks to allow holding Effient?.  In this setting, I would imagine that waiting until August would not be acceptable.  ? ?Glenetta Hew, MD ? ?

## 2021-06-14 NOTE — Telephone Encounter (Signed)
We would be able to have the discussion more fully with her and her family and with you as well. ?I think that would be reasonable. ?If typical smaller polyps we would then likely wait until August. ?Can do biopsies on Effient, so OK otherwise if something is concerning. ?Thanks. ?GM ?

## 2021-06-16 NOTE — Telephone Encounter (Signed)
? ?  Primary Cardiologist: Glenetta Hew, MD ? ?Chart reviewed as part of pre-operative protocol coverage. Given past medical history and time since last visit, based on ACC/AHA guidelines, Amber Peterson would be at acceptable risk for the planned procedure without further cardiovascular testing.  ? ?She will need to continue her Effient throughout her procedure. ? ?I will route this recommendation to the requesting party via Epic fax function and remove from pre-op pool. ? ?Please call with questions. ? ?Jossie Ng. Jandy Brackens NP-C ? ?  ?06/16/2021, 8:34 AM ?[facility] ?[address] ?Office [phone] Fax [phone] ? ? ? ? ?

## 2021-06-16 NOTE — Telephone Encounter (Signed)
Called patient to follow up with patient. I let her know that Dr. Rush Landmark had discussed hr case with Dr. Ellyn Hack and she is okay to proceed with her procedure with still on Effient. She has expressed understanding.  ?

## 2021-06-17 ENCOUNTER — Other Ambulatory Visit (HOSPITAL_COMMUNITY): Payer: Self-pay | Admitting: Family Medicine

## 2021-06-17 DIAGNOSIS — M5451 Vertebrogenic low back pain: Secondary | ICD-10-CM | POA: Diagnosis not present

## 2021-06-17 DIAGNOSIS — R809 Proteinuria, unspecified: Secondary | ICD-10-CM | POA: Diagnosis not present

## 2021-06-17 DIAGNOSIS — I739 Peripheral vascular disease, unspecified: Secondary | ICD-10-CM | POA: Diagnosis not present

## 2021-06-17 DIAGNOSIS — I5022 Chronic systolic (congestive) heart failure: Secondary | ICD-10-CM | POA: Diagnosis not present

## 2021-06-17 DIAGNOSIS — M79605 Pain in left leg: Secondary | ICD-10-CM | POA: Diagnosis not present

## 2021-06-17 DIAGNOSIS — I255 Ischemic cardiomyopathy: Secondary | ICD-10-CM | POA: Diagnosis not present

## 2021-06-17 DIAGNOSIS — N1831 Chronic kidney disease, stage 3a: Secondary | ICD-10-CM | POA: Diagnosis not present

## 2021-06-17 DIAGNOSIS — I2510 Atherosclerotic heart disease of native coronary artery without angina pectoris: Secondary | ICD-10-CM | POA: Diagnosis not present

## 2021-06-17 DIAGNOSIS — G894 Chronic pain syndrome: Secondary | ICD-10-CM | POA: Diagnosis not present

## 2021-06-17 DIAGNOSIS — J4520 Mild intermittent asthma, uncomplicated: Secondary | ICD-10-CM | POA: Diagnosis not present

## 2021-06-17 DIAGNOSIS — Z1231 Encounter for screening mammogram for malignant neoplasm of breast: Secondary | ICD-10-CM

## 2021-06-17 DIAGNOSIS — Z13820 Encounter for screening for osteoporosis: Secondary | ICD-10-CM

## 2021-06-17 DIAGNOSIS — R7303 Prediabetes: Secondary | ICD-10-CM | POA: Diagnosis not present

## 2021-06-17 DIAGNOSIS — E538 Deficiency of other specified B group vitamins: Secondary | ICD-10-CM | POA: Diagnosis not present

## 2021-06-19 ENCOUNTER — Ambulatory Visit (INDEPENDENT_AMBULATORY_CARE_PROVIDER_SITE_OTHER): Payer: Medicare Other | Admitting: Gastroenterology

## 2021-06-20 ENCOUNTER — Other Ambulatory Visit: Payer: Self-pay | Admitting: Cardiology

## 2021-06-27 ENCOUNTER — Encounter: Payer: Self-pay | Admitting: Gastroenterology

## 2021-07-03 ENCOUNTER — Ambulatory Visit (AMBULATORY_SURGERY_CENTER): Payer: Medicare Other | Admitting: Gastroenterology

## 2021-07-03 ENCOUNTER — Other Ambulatory Visit: Payer: Self-pay

## 2021-07-03 ENCOUNTER — Encounter: Payer: Self-pay | Admitting: Gastroenterology

## 2021-07-03 VITALS — BP 109/74 | HR 84 | Temp 99.3°F | Resp 14 | Ht <= 58 in | Wt 133.0 lb

## 2021-07-03 DIAGNOSIS — D122 Benign neoplasm of ascending colon: Secondary | ICD-10-CM

## 2021-07-03 DIAGNOSIS — D123 Benign neoplasm of transverse colon: Secondary | ICD-10-CM | POA: Diagnosis not present

## 2021-07-03 DIAGNOSIS — K921 Melena: Secondary | ICD-10-CM | POA: Diagnosis not present

## 2021-07-03 DIAGNOSIS — R195 Other fecal abnormalities: Secondary | ICD-10-CM

## 2021-07-03 DIAGNOSIS — K449 Diaphragmatic hernia without obstruction or gangrene: Secondary | ICD-10-CM | POA: Diagnosis not present

## 2021-07-03 DIAGNOSIS — Z1211 Encounter for screening for malignant neoplasm of colon: Secondary | ICD-10-CM

## 2021-07-03 DIAGNOSIS — D120 Benign neoplasm of cecum: Secondary | ICD-10-CM

## 2021-07-03 DIAGNOSIS — Z8719 Personal history of other diseases of the digestive system: Secondary | ICD-10-CM

## 2021-07-03 MED ORDER — SODIUM CHLORIDE 0.9 % IV SOLN: 500.0000 mL | Freq: Once | INTRAVENOUS | Status: DC

## 2021-07-03 NOTE — Progress Notes (Signed)
? ?GASTROENTEROLOGY PROCEDURE H&P NOTE  ? ?Primary Care Physician: ?Celene Squibb, MD ? ?HPI: ?Amber Peterson is a 66 y.o. female who presents for EGD/Colonoscopy to evaluate for Melena, Dark Stools, Positive Cologuard.  Patient to remain on Effient due to recent DES stents placed. ? ?Past Medical History:  ?Diagnosis Date  ? Acute ST elevation myocardial infarction (STEMI) due to occlusion of distal portion of left anterior descending (LAD) coronary artery ([place]) 12/08/2020  ? 100 % prox-mid LAD - DES PCI (also staged PCI-PTCA of LCx-OM2 (stent)-AVGVCx (PTCA).  Initial EF 30-35% -> Improved to 55-60% on 4 month f/u Echo.  ? Anxiety   ? Arthritis   ? all major joint  ? Asthma 07/22/2017  ? Chronic headaches   ? Depression   ? Hepatitis C 07/22/2017  ? History of kidney stones   ? Hypertension   ? Ischemic cardiomyopathy - RESOLVED 12/10/2020  ? EF 30 to 35% with anterior, anterolateral and inferior hypokinesis to akinesis consistent with LAD infarct. -> 4 month Post-MI / PCI Echo - EF 55-60%.  ? ~2 Vessel CORONARY ARTERY DISEASE -  LAD & LCx-OM2 12/08/2020  ? 12/08/2020: 100 % mLAD - DES PCI Onyx Frontier DES 2.5 x 26; 80%-99% bifurcation LCx-OM/AVG Cx (Sttaged PCI)> DES PCI Prox-Mid Cx 25%->OM2 90% -> DES PCI ONYX FRONTIER 2.0 x 15 -> 2.3 mm; PTCA of AVG LCx (thrombotic 99%) -> 1.5 mm Balloon PTCA (through stent) - 20% residual.  ? ?Past Surgical History:  ?Procedure Laterality Date  ? ABDOMINAL HYSTERECTOMY  1995  ? ANKLE FRACTURE SURGERY Left 01/26/2021  ? and leg  ? CORONARY BALLOON ANGIOPLASTY N/A 12/10/2020  ? Procedure: CORONARY BALLOON ANGIOPLASTY;  Surgeon: Troy Sine, MD;  Location: [facility];  Service: Cardiovascular;; STAGED Bifurcation LCx-OM2-AVGCx: PTCA of AVG LCx (thrombotic 99%) -> 1.5 mm Balloon PTCA (through stent into OM2) - 20% residual.  ? CORONARY STENT INTERVENTION N/A 12/08/2020  ? Procedure: CORONARY STENT INTERVENTION;  Surgeon: Lorretta Harp, MD;  Location: [facility];  Service: Cardiovascular;; Prox-Mid LAD 100% (DES PCI ONYX FRONTIER 2.5 x 26 mm -2.6 mm  ? CORONARY STENT INTERVENTION N/A 12/10/2020  ? Procedure: CORONARY STENT INTERVENTION;  Surgeon: Troy Sine, MD;  Location: [facility];  Service: Cardiovascular;; STAGED DES PCI Prox-Mid Cx 25%->OM2 90% -> DES PCI ONYX FRONTIER 2.0 x 15 -> 2.3 mm  ? CORONARY/GRAFT ACUTE MI REVASCULARIZATION N/A 12/08/2020  ? Procedure: Coronary/Graft Acute MI Revascularization;  Surgeon: Lorretta Harp, MD;  Location: [facility];  Service: Cardiovascular;  Laterality: N/A;  ? LEFT HEART CATH N/A 12/10/2020  ? Procedure: Left Heart Cath;  Surgeon: Troy Sine, MD;  Location: [facility];  Service: Cardiovascular; Catheter spasm of LM on initial Guide placement. -> Smooth ~50% prox LAD (pre-stent), patent stent.  30% Ost LCx, 25% m LCx, Ost OM2 90% & thrombotic AVG LCx 99% LVEDP 13 mmHg  ? LEFT HEART CATH AND CORONARY ANGIOGRAPHY N/A 12/08/2020  ? Procedure: LEFT HEART CATH AND CORONARY ANGIOGRAPHY;  Surgeon: Lorretta Harp, MD;  Location: [facility];  Service: Cardiovascular; (Ant STEMI): Prox RCA 30%. Short LM. Prox-mid LCx 80% (actually into OM2) & AVG LCVx 99%; Prox-Mid LAD 100%  (DES PCI LAD, Staged PCI of LCx)  ? REVERSE SHOULDER ARTHROPLASTY Right 03/22/2019  ? Procedure: REVERSE SHOULDER ARTHROPLASTY;  Surgeon: Hiram Gash, MD;  Location: [facility];  Service: Orthopedics;  Laterality: Right;  ?  TONSILLECTOMY  2000  ? TRANSTHORACIC ECHOCARDIOGRAM  12/09/2020  ? (Ant STEMI on 12/08/2020: EF 30 to 35%.  Moderate reduced EF.  Mid-apical anterior, anteroseptal and inferoseptal AK.  Also apical inferior, apical lateral and apical akinesis.  GR 1 DD.  Mild LA dilation.  Normal RV size, function and RVP, RAP.Marland Kitchen  Normal valves.  ? TRANSTHORACIC ECHOCARDIOGRAM  03/24/2021  ? (3 months post Anterior STEMI with LAD and LCx PCI w/ initial) EF 30-35%) : EF IMPROVED to 55-60%. LV with regional wall motion  abnormalities. Apical septal segments and apex are akinetic. Mid segments are hypokinetic. Mild concentric LVH. Grade 1 DD.  ? ?Current Outpatient Medications  ?Medication Sig Dispense Refill  ? metoprolol succinate (TOPROL-XL) 25 MG 24 hr tablet TAKE 1 TABLET (25 MG TOTAL) BY MOUTH DAILY. 90 tablet 1  ? acetaminophen (TYLENOL) 500 MG tablet Take 2,000 mg by mouth as needed for moderate pain.    ? albuterol (VENTOLIN HFA) 108 (90 Base) MCG/ACT inhaler Inhale 2 puffs into the lungs every 6 (six) hours as needed for wheezing or shortness of breath.    ? ALPRAZolam (XANAX) 0.5 MG tablet Take 0.5 mg by mouth 2 (two) times daily.    ? Ascorbic Acid (VITAMIN C PO) Take 1 tablet by mouth daily.    ? atorvastatin (LIPITOR) 40 MG tablet Take 1 tablet (40 mg total) by mouth daily. 90 tablet 3  ? Biotin 1000 MCG tablet Take 1,000 mcg by mouth daily.    ? budesonide-formoterol (SYMBICORT) 160-4.5 MCG/ACT inhaler Inhale 2 puffs into the lungs 2 (two) times daily.    ? Cholecalciferol (DIALYVITE VITAMIN D 5000 PO) Take 5,000 Units by mouth daily.    ? gabapentin (NEURONTIN) 300 MG capsule Take 1 capsule (300 mg total) by mouth 3 (three) times daily. (Patient taking differently: Take 300 mg by mouth 3 (three) times daily as needed (pain).) 60 capsule 0  ? HYDROcodone-acetaminophen (NORCO/VICODIN) 5-325 MG tablet Take 1 tablet by mouth every 8 (eight) hours.    ? hydrocortisone cream 1 % Apply 1 application topically daily as needed for itching.    ? nitroGLYCERIN (NITROSTAT) 0.4 MG SL tablet Place 1 tablet (0.4 mg total) under the tongue every 5 (five) minutes as needed for chest pain. (Patient not taking: Reported on 06/12/2021) 25 tablet 1  ? omeprazole (PRILOSEC) 40 MG capsule Take 1 capsule (40 mg total) by mouth daily before breakfast. 30 capsule 6  ? prasugrel (EFFIENT) 10 MG TABS tablet Take 1 tablet (10 mg total) by mouth daily. Do NOT take with Brilinta. 90 tablet 3  ? sacubitril-valsartan (ENTRESTO) 24-26 MG Take 1 tablet  by mouth 2 (two) times daily. 60 tablet 5  ? spironolactone (ALDACTONE) 25 MG tablet Take 12.5 mg by mouth daily.    ? Tetrahydrozoline HCl (VISINE OP) Place 1 drop into both eyes daily as needed (dry eyes).    ? thyroid (ARMOUR) 15 MG tablet Take 15 mg by mouth daily.    ? traZODone (DESYREL) 50 MG tablet Take 150 mg by mouth at bedtime.    ? venlafaxine XR (EFFEXOR-XR) 150 MG 24 hr capsule Take 150 mg by mouth daily with breakfast.    ? vitamin B-12 (CYANOCOBALAMIN) 1000 MCG tablet Take 1,000 mcg by mouth daily.    ? zinc gluconate 50 MG tablet Take 50 mg by mouth daily.    ? ?No current facility-administered medications for this visit.  ? ? ?Current Outpatient Medications:  ?  metoprolol succinate (TOPROL-XL)  25 MG 24 hr tablet, TAKE 1 TABLET (25 MG TOTAL) BY MOUTH DAILY., Disp: 90 tablet, Rfl: 1 ?  acetaminophen (TYLENOL) 500 MG tablet, Take 2,000 mg by mouth as needed for moderate pain., Disp: , Rfl:  ?  albuterol (VENTOLIN HFA) 108 (90 Base) MCG/ACT inhaler, Inhale 2 puffs into the lungs every 6 (six) hours as needed for wheezing or shortness of breath., Disp: , Rfl:  ?  ALPRAZolam (XANAX) 0.5 MG tablet, Take 0.5 mg by mouth 2 (two) times daily., Disp: , Rfl:  ?  Ascorbic Acid (VITAMIN C PO), Take 1 tablet by mouth daily., Disp: , Rfl:  ?  atorvastatin (LIPITOR) 40 MG tablet, Take 1 tablet (40 mg total) by mouth daily., Disp: 90 tablet, Rfl: 3 ?  Biotin 1000 MCG tablet, Take 1,000 mcg by mouth daily., Disp: , Rfl:  ?  budesonide-formoterol (SYMBICORT) 160-4.5 MCG/ACT inhaler, Inhale 2 puffs into the lungs 2 (two) times daily., Disp: , Rfl:  ?  Cholecalciferol (DIALYVITE VITAMIN D 5000 PO), Take 5,000 Units by mouth daily., Disp: , Rfl:  ?  gabapentin (NEURONTIN) 300 MG capsule, Take 1 capsule (300 mg total) by mouth 3 (three) times daily. (Patient taking differently: Take 300 mg by mouth 3 (three) times daily as needed (pain).), Disp: 60 capsule, Rfl: 0 ?  HYDROcodone-acetaminophen (NORCO/VICODIN) 5-325 MG  tablet, Take 1 tablet by mouth every 8 (eight) hours., Disp: , Rfl:  ?  hydrocortisone cream 1 %, Apply 1 application topically daily as needed for itching., Disp: , Rfl:  ?  nitroGLYCERIN (NITROSTAT) 0.

## 2021-07-03 NOTE — Progress Notes (Signed)
1512 HR > 100 with esmolol 25 mg given IV, MD updated, vss  ?

## 2021-07-03 NOTE — Op Note (Signed)
[facility] ?Patient Name: Amber Peterson ?Procedure Date: 07/03/2021 2:38 PM ?MRN: [id number] ?Endoscopist: Justice Britain , MD ?Age: 66 ?Referring MD:  ?Date of Birth: 1955-09-05 ?Gender: Female ?Account #: [id number] ?Procedure:                Colonoscopy ?Indications:              Positive Cologuard test ?Medicines:                Monitored Anesthesia Care ?Procedure:                Pre-Anesthesia Assessment: ?                          - Prior to the procedure, a History and Physical  ?                          was performed, and patient medications and  ?                          allergies were reviewed. The patient's tolerance of  ?                          previous anesthesia was also reviewed. The risks  ?                          and benefits of the procedure and the sedation  ?                          options and risks were discussed with the patient.  ?                          All questions were answered, and informed consent  ?                          was obtained. Prior Anticoagulants: The patient has  ?                          taken Effient, last dose was day of procedure. ASA  ?                          Grade Assessment: III - A patient with severe  ?                          systemic disease. After reviewing the risks and  ?                          benefits, the patient was deemed in satisfactory  ?                          condition to undergo the procedure. ?                          After obtaining informed consent, the colonoscope  ?  was passed under direct vision. Throughout the  ?                          procedure, the patient's blood pressure, pulse, and  ?                          oxygen saturations were monitored continuously. The  ?                          Olympus [id number]-[id number] (#[id number]) Colonoscope was  ?                          introduced through the anus and advanced to the the  ?                          cecum, identified by appendiceal orifice  and  ?                          ileocecal valve. The colonoscopy was performed  ?                          without difficulty. The patient tolerated the  ?                          procedure. The quality of the bowel preparation was  ?                          good. The ileocecal valve, appendiceal orifice, and  ?                          rectum were photographed. ?Scope In: 3:05:59 PM ?Scope Out: 3:16:28 PM ?Scope Withdrawal Time: 0 hours 6 minutes 8 seconds  ?Total Procedure Duration: 0 hours 10 minutes 29 seconds  ?Findings:                 The digital rectal exam findings include  ?                          hemorrhoids. Pertinent negatives include no  ?                          palpable rectal lesions. ?                          The colon (entire examined portion) revealed  ?                          moderately excessive looping. ?                          A few sessile polyps were found in the transverse  ?                          colon, ascending colon and cecum. The polyps were 2  ?  to 5 mm in size. Polypectomy was not attempted due  ?                          to patient being on Effient due to recent PCI  ?                          intervention with DES. ?                          Normal mucosa was found in the entire colon  ?                          otherwise. ?                          Non-bleeding non-thrombosed external and internal  ?                          hemorrhoids were found during retroflexion, during  ?                          perianal exam and during digital exam. The  ?                          hemorrhoids were Grade II (internal hemorrhoids  ?                          that prolapse but reduce spontaneously). ?Complications:            No immediate complications. ?Estimated Blood Loss:     Estimated blood loss: none. ?Impression:               - Hemorrhoids found on digital rectal exam. ?                          - There was significant looping of the colon. ?                           - A few 2 to 5 mm polyps in the transverse colon,  ?                          in the ascending colon and in the cecum. Resection  ?                          not attempted. ?                          - Normal mucosa in the entire examined colon  ?                          otherwise. ?                          - Non-bleeding non-thrombosed external and internal  ?  hemorrhoids. ?Recommendation:           - The patient will be observed post-procedure,  ?                          until all discharge criteria are met. ?                          - Discharge patient to home. ?                          - Patient has a contact number available for  ?                          emergencies. The signs and symptoms of potential  ?                          delayed complications were discussed with the  ?                          patient. Return to normal activities tomorrow.  ?                          Written discharge instructions were provided to the  ?                          patient. ?                          - High fiber diet. ?                          - Use FiberCon 1-2 tablets PO daily. ?                          - Continue present medications. ?                          - May continue Effient this evening. ?                          - Repeat colonoscopy in 6-12 months for screening  ?                          purposes when patient can come off Effient for  ?                          5-days for polypectomies. ?                          - The findings and recommendations were discussed  ?                          with the patient. ?                          - The findings and recommendations were discussed  ?  with the patient's family. ?Justice Britain, MD ?07/03/2021 3:27:50 PM ?

## 2021-07-03 NOTE — Progress Notes (Signed)
Called to room to assist during endoscopic procedure.  Patient ID and intended procedure confirmed with present staff. Received instructions for my participation in the procedure from the performing physician.  

## 2021-07-03 NOTE — Patient Instructions (Addendum)
Handouts given for gastritis, high fiber diet and polyps.  Continue PPI once a day.    High Fiber Diet.  Use Fibercon 1-2 tablets a day.  Continue present medications.   ?May continue Effient this evening.   ?Repeat Colonoscopy in 6-12 months for screening purposes when patient can come off Effient for 5 days for polypectomies. Await pathology results. ? ?YOU HAD AN ENDOSCOPIC PROCEDURE TODAY AT [facility]:   Refer to the procedure report that was given to you for any specific questions about what was found during the examination.  If the procedure report does not answer your questions, please call your gastroenterologist to clarify.  If you requested that your care partner not be given the details of your procedure findings, then the procedure report has been included in a sealed envelope for you to review at your convenience later. ? ?YOU SHOULD EXPECT: Some feelings of bloating in the abdomen. Passage of more gas than usual.  Walking can help get rid of the air that was put into your GI tract during the procedure and reduce the bloating. If you had a lower endoscopy (such as a colonoscopy or flexible sigmoidoscopy) you may notice spotting of blood in your stool or on the toilet paper. If you underwent a bowel prep for your procedure, you may not have a normal bowel movement for a few days. ? ?Please Note:  You might notice some irritation and congestion in your nose or some drainage.  This is from the oxygen used during your procedure.  There is no need for concern and it should clear up in a day or so. ? ?SYMPTOMS TO REPORT IMMEDIATELY: ? ?Following lower endoscopy (colonoscopy or flexible sigmoidoscopy): ? Excessive amounts of blood in the stool ? Significant tenderness or worsening of abdominal pains ? Swelling of the abdomen that is new, acute ? Fever of 100?F or higher ? ?For urgent or emergent issues, a gastroenterologist can be reached at any hour by calling [phone]. ?Do not use  MyChart messaging for urgent concerns.  ? ? ?DIET:  We do recommend a small meal at first, but then you may proceed to your regular diet.  Drink plenty of fluids but you should avoid alcoholic beverages for 24 hours. ? ?ACTIVITY:  You should plan to take it easy for the rest of today and you should NOT DRIVE or use heavy machinery until tomorrow (because of the sedation medicines used during the test).   ? ?FOLLOW UP: ?Our staff will call the number listed on your records 48-72 hours following your procedure to check on you and address any questions or concerns that you may have regarding the information given to you following your procedure. If we do not reach you, we will leave a message.  We will attempt to reach you two times.  During this call, we will ask if you have developed any symptoms of COVID 19. If you develop any symptoms (ie: fever, flu-like symptoms, shortness of breath, cough etc.) before then, please call [phone].  If you test positive for Covid 19 in the 2 weeks post procedure, please call and report this information to [place].   ? ?If any biopsies were taken you will be contacted by phone or by letter within the next 1-3 weeks.  Please call us at [phone] if you have not heard about the biopsies in 3 weeks.  ? ? ?SIGNATURES/CONFIDENTIALITY: ?You and/or your care partner have signed paperwork which will be entered  into your electronic medical record.  These signatures attest to the fact that that the information above on your After Visit Summary has been reviewed and is understood.  Full responsibility of the confidentiality of this discharge information lies with you and/or your care-partner. YOU HAD AN ENDOSCOPIC PROCEDURE TODAY AT [facility]:   Refer to the procedure report that was given to you for any specific questions about what was found during the examination.  If the procedure report does not answer your questions, please call your gastroenterologist to clarify.   If you requested that your care partner not be given the details of your procedure findings, then the procedure report has been included in a sealed envelope for you to review at your convenience later. ? ?YOU SHOULD EXPECT: Some feelings of bloating in the abdomen. Passage of more gas than usual.  Walking can help get rid of the air that was put into your GI tract during the procedure and reduce the bloating. If you had a lower endoscopy (such as a colonoscopy or flexible sigmoidoscopy) you may notice spotting of blood in your stool or on the toilet paper. If you underwent a bowel prep for your procedure, you may not have a normal bowel movement for a few days. ? ?Please Note:  You might notice some irritation and congestion in your nose or some drainage.  This is from the oxygen used during your procedure.  There is no need for concern and it should clear up in a day or so. ? ?SYMPTOMS TO REPORT IMMEDIATELY: ? ?Following lower endoscopy (colonoscopy or flexible sigmoidoscopy): ? Excessive amounts of blood in the stool ? Significant tenderness or worsening of abdominal pains ? Swelling of the abdomen that is new, acute ? Fever of 100?F or higher ? ?Following upper endoscopy (EGD) ? Vomiting of blood or coffee ground material ? New chest pain or pain under the shoulder blades ? Painful or persistently difficult swallowing ? New shortness of breath ? Fever of 100?F or higher ? Black, tarry-looking stools ? ?For urgent or emergent issues, a gastroenterologist can be reached at any hour by calling [phone]. ?Do not use MyChart messaging for urgent concerns.  ? ? ?DIET:  We do recommend a small meal at first, but then you may proceed to your regular diet.  Drink plenty of fluids but you should avoid alcoholic beverages for 24 hours. ? ?ACTIVITY:  You should plan to take it easy for the rest of today and you should NOT DRIVE or use heavy machinery until tomorrow (because of the sedation medicines used during the  test).   ? ?FOLLOW UP: ?Our staff will call the number listed on your records 48-72 hours following your procedure to check on you and address any questions or concerns that you may have regarding the information given to you following your procedure. If we do not reach you, we will leave a message.  We will attempt to reach you two times.  During this call, we will ask if you have developed any symptoms of COVID 19. If you develop any symptoms (ie: fever, flu-like symptoms, shortness of breath, cough etc.) before then, please call [phone].  If you test positive for Covid 19 in the 2 weeks post procedure, please call and report this information to [place].   ? ?If any biopsies were taken you will be contacted by phone or by letter within the next 1-3 weeks.  Please call us at [phone] if you  have not heard about the biopsies in 3 weeks.  ? ? ?SIGNATURES/CONFIDENTIALITY: ?You and/or your care partner have signed paperwork which will be entered into your electronic medical record.  These signatures attest to the fact that that the information above on your After Visit Summary has been reviewed and is understood.  Full responsibility of the confidentiality of this discharge information lies with you and/or your care-partner. YOU HAD AN ENDOSCOPIC PROCEDURE TODAY AT [facility]:   Refer to the procedure report that was given to you for any specific questions about what was found during the examination.  If the procedure report does not answer your questions, please call your gastroenterologist to clarify.  If you requested that your care partner not be given the details of your procedure findings, then the procedure report has been included in a sealed envelope for you to review at your convenience later. ? ?YOU SHOULD EXPECT: Some feelings of bloating in the abdomen. Passage of more gas than usual.  Walking can help get rid of the air that was put into your GI tract during the procedure and  reduce the bloating. If you had a lower endoscopy (such as a colonoscopy or flexible sigmoidoscopy) you may notice spotting of blood in your stool or on the toilet paper. If you underwent a bowel prep for y

## 2021-07-03 NOTE — Op Note (Signed)
[place] ?Patient Name: Amber Peterson ?Procedure Date: 07/03/2021 2:50 PM ?MRN: [id number] ?Endoscopist: Justice Britain , MD ?Age: 66 ?Referring MD:  ?Date of Birth: 12-Mar-1956 ?Gender: Female ?Account #: [id number] ?Procedure:                Upper GI endoscopy ?Indications:              Melena, Suspected upper gastrointestinal bleeding ?Medicines:                Monitored Anesthesia Care ?Procedure:                Pre-Anesthesia Assessment: ?                          - Prior to the procedure, a History and Physical  ?                          was performed, and patient medications and  ?                          allergies were reviewed. The patient's tolerance of  ?                          previous anesthesia was also reviewed. The risks  ?                          and benefits of the procedure and the sedation  ?                          options and risks were discussed with the patient.  ?                          All questions were answered, and informed consent  ?                          was obtained. Prior Anticoagulants: The patient has  ?                          taken Effient, last dose was day of procedure. ASA  ?                          Grade Assessment: III - A patient with severe  ?                          systemic disease. After reviewing the risks and  ?                          benefits, the patient was deemed in satisfactory  ?                          condition to undergo the procedure. ?                          After obtaining informed consent, the endoscope was  ?  passed under direct vision. Throughout the  ?                          procedure, the patient's blood pressure, pulse, and  ?                          oxygen saturations were monitored continuously. The  ?                          GIF [id number] #[id number] was introduced through the  ?                          mouth, and advanced to the second part of duodenum. ?Scope In: ?Scope Out: ?Findings:                  No gross lesions were noted in the entire esophagus. ?                          The Z-line was irregular and was found 34 cm from  ?                          the incisors. ?                          A 1 cm hiatal hernia was present. ?                          Patchy mildly erythematous mucosa without bleeding  ?                          was found in the entire examined stomach. Biopsies  ?                          were taken with a cold forceps for histology and  ?                          Helicobacter pylori testing. ?                          No gross lesions were noted in the duodenal bulb,  ?                          in the first portion of the duodenum and in the  ?                          second portion of the duodenum. ?Complications:            No immediate complications. ?Estimated Blood Loss:     Estimated blood loss was minimal. ?Impression:               - No gross lesions in esophagus. Z-line irregular,  ?                          34 cm from the incisors. ?                          -  1 cm hiatal hernia. ?                          - Erythematous mucosa in the stomach. Biopsied. ?                          - No gross lesions in the duodenal bulb, in the  ?                          first portion of the duodenum and in the second  ?                          portion of the duodenum. ?Recommendation:           - Proceed to scheduled colonoscopy. ?                          - Observe patient's clinical course. ?                          - Continue PPI once daily. ?                          - Observe patient's clinical course. ?                          - Await pathology results. ?                          - The findings and recommendations were discussed  ?                          with the patient. ?                          - The findings and recommendations were discussed  ?                          with the patient's family. ?Justice Britain, MD ?07/03/2021 3:21:58 PM ?

## 2021-07-03 NOTE — Progress Notes (Signed)
1451 Robinul 0.1 mg IV given due large amount of secretions upon assessment.  MD made aware, vss  

## 2021-07-03 NOTE — Progress Notes (Signed)
Report given to PACU, vss 

## 2021-07-07 ENCOUNTER — Telehealth: Payer: Self-pay

## 2021-07-07 NOTE — Telephone Encounter (Signed)
?  Follow up Call- ? ? ?  07/03/2021  ?  1:28 PM  ?Call back number  ?Post procedure Call Back phone  # [phone]  ?Permission to leave phone message Yes  ?  ? ?Patient questions: ? ?Do you have a fever, pain , or abdominal swelling? No. ?Pain Score  0 * ? ?Have you tolerated food without any problems? Yes.   ? ?Have you been able to return to your normal activities? Yes.   ? ?Do you have any questions about your discharge instructions: ?Diet   No. ?Medications  No. ?Follow up visit  No. ? ?Do you have questions or concerns about your Care? No. ? ?Actions: ?* If pain score is 4 or above: ?No action needed, pain <4. ? ?Have you developed a fever since your procedure? no ? ?2.   Have you had an respiratory symptoms (SOB or cough) since your procedure? no ? ?3.   Have you tested positive for COVID 19 since your procedure no ? ?4.   Have you had any family members/close contacts diagnosed with the COVID 19 since your procedure?  no ? ? ?If yes to any of these questions please route to Joylene John, RN and Joella Prince, RN  ? ? ?

## 2021-07-08 ENCOUNTER — Encounter: Payer: Self-pay | Admitting: Gastroenterology

## 2021-07-11 ENCOUNTER — Other Ambulatory Visit: Payer: Self-pay | Admitting: Cardiology

## 2021-07-12 IMAGING — CT CT SHOULDER*R* W/O CM
2 series · 14 of 20 positions shown, 17 images · non-contrast
Comparison: None.

CLINICAL DATA: Status post fall 01/27/2019. Preop evaluation for
surgery.

EXAM:
CT OF THE UPPER RIGHT EXTREMITY WITHOUT CONTRAST
TECHNIQUE: Multidetector CT imaging of the upper right extremity was performed
according to the standard protocol.

[Series 2: shoulder 2.00 br40 s3 axial soft · axial · 0.47mm/px · z∈[-1086,-974]mm · 11 of 68 slices shown, 14 images]
[im 6/68  soft-tissue]
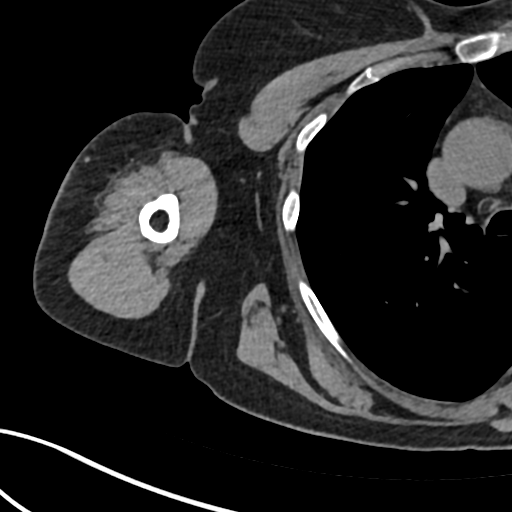
[im 6/68  bone]
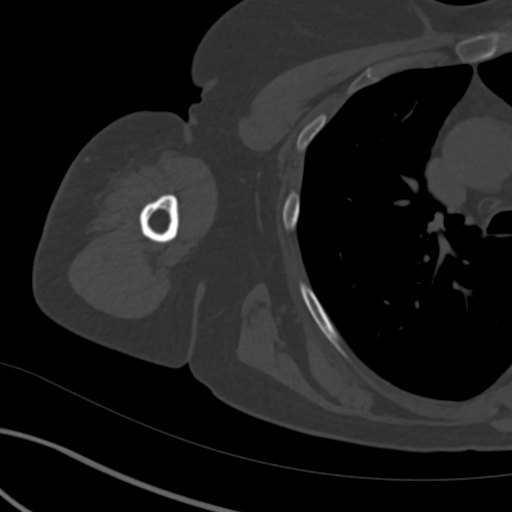
[im 11/68  bone]
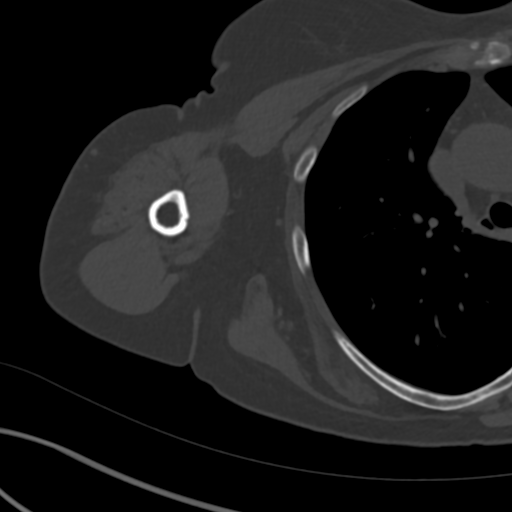
[im 16/68  bone]
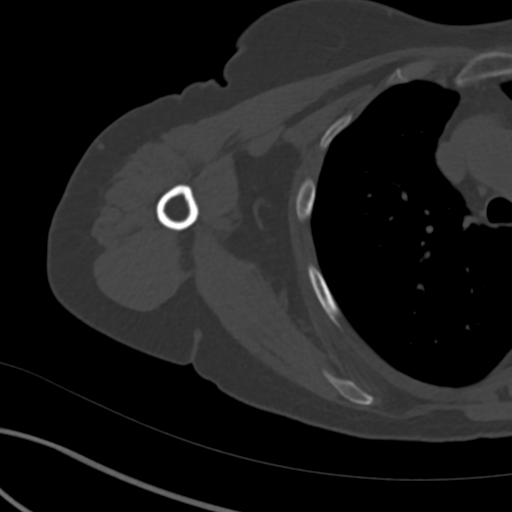
[im 21/68  bone]
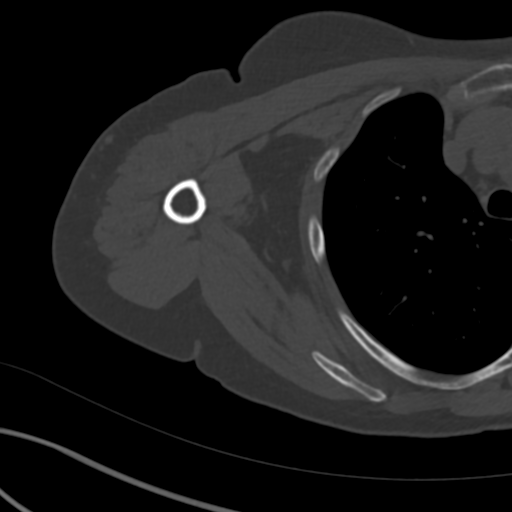
[im 26/68  soft-tissue]
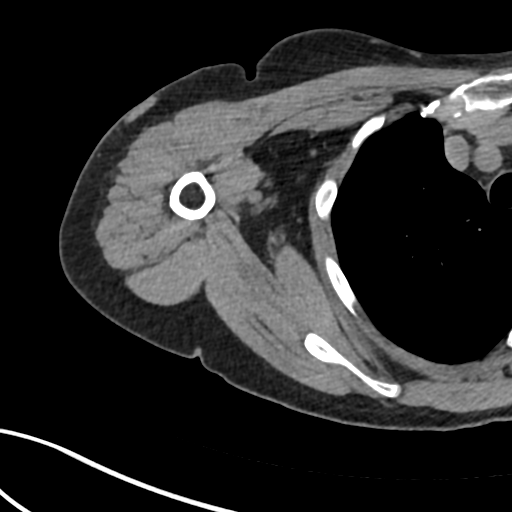
[im 26/68  bone]
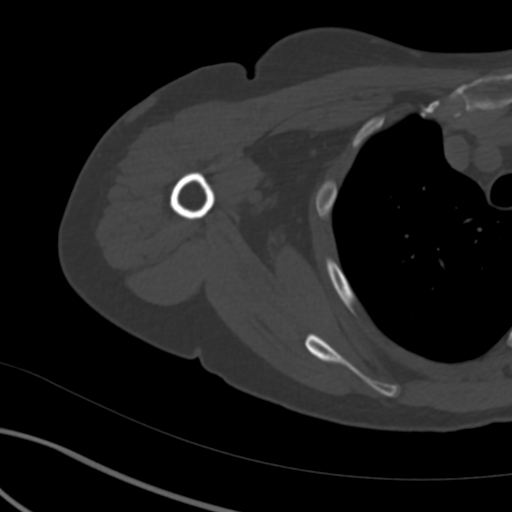
[im 37/68  bone]
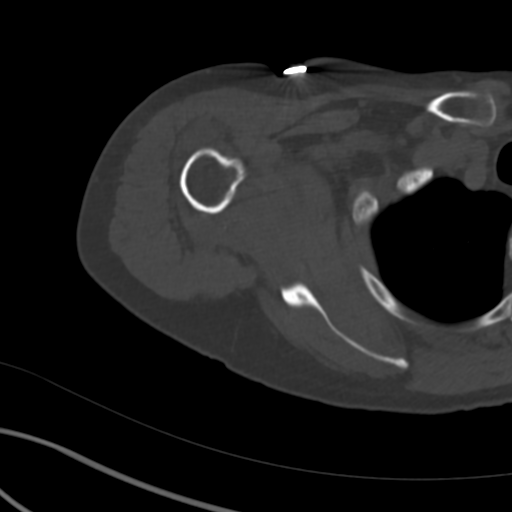
[im 42/68  bone]
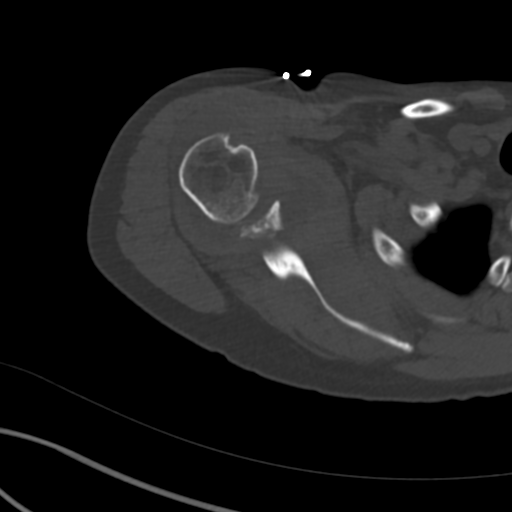
[im 47/68  bone]
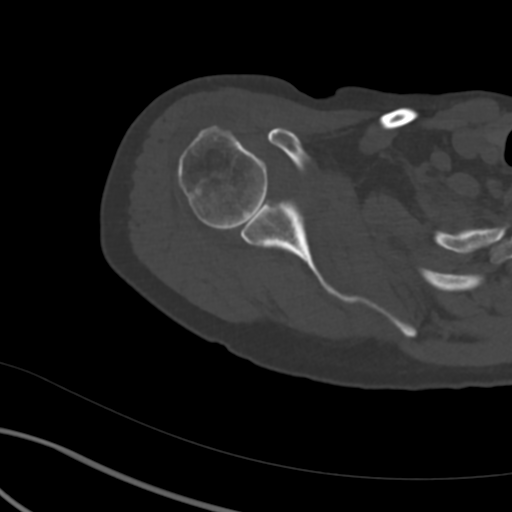
[im 52/68  soft-tissue]
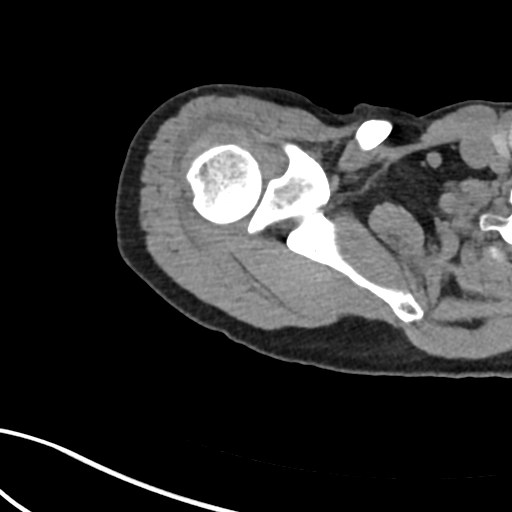
[im 52/68  bone]
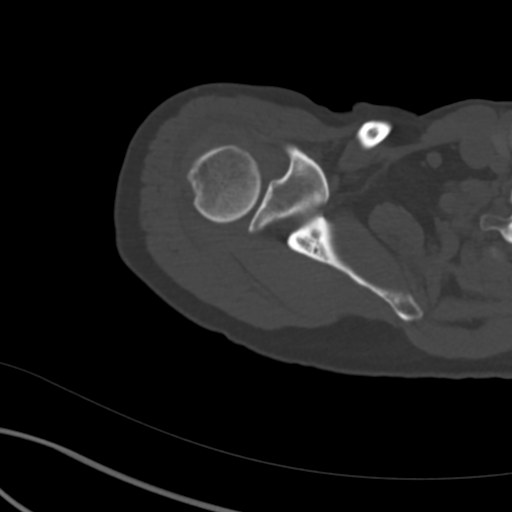
[im 57/68  bone]
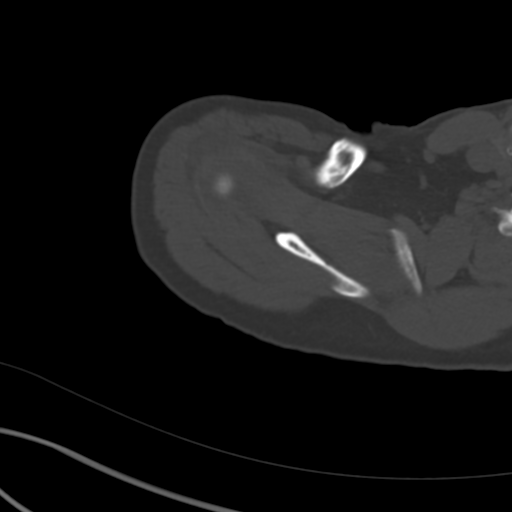
[im 62/68  bone]
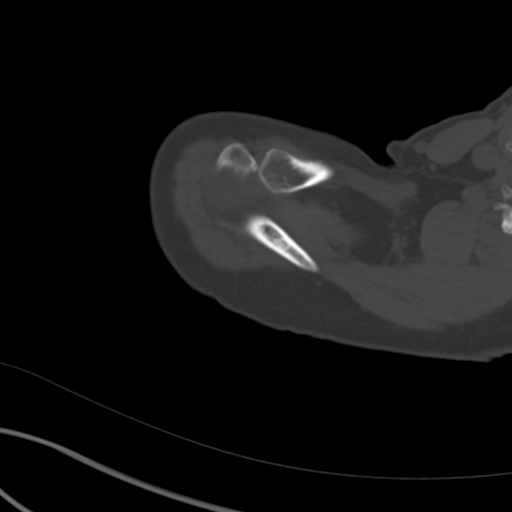

[Series 6: shoulder 2.00 br40 s3 cor soft · coronal · 0.27mm/px · 3 of 82 slices shown]
[im 17/82  bone]
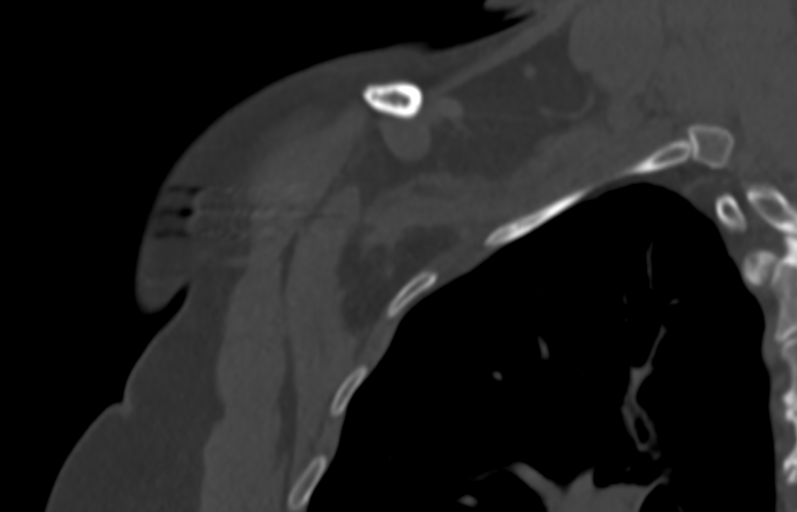
[im 33/82  bone]
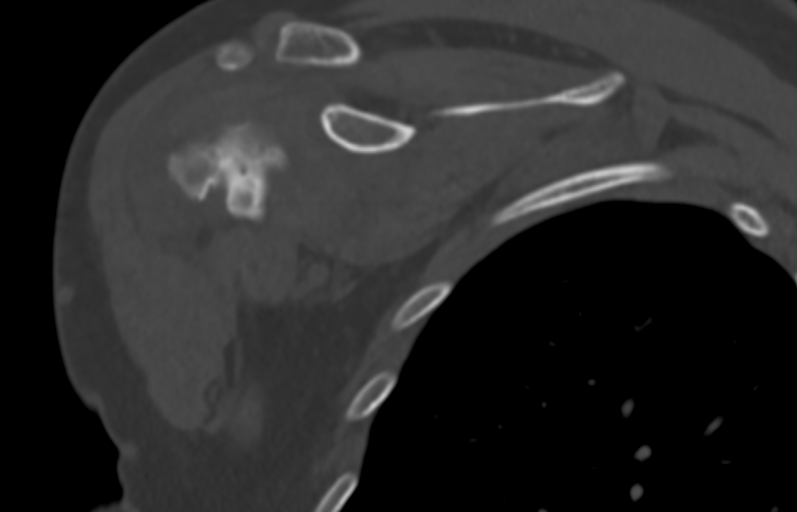
[im 49/82  bone]
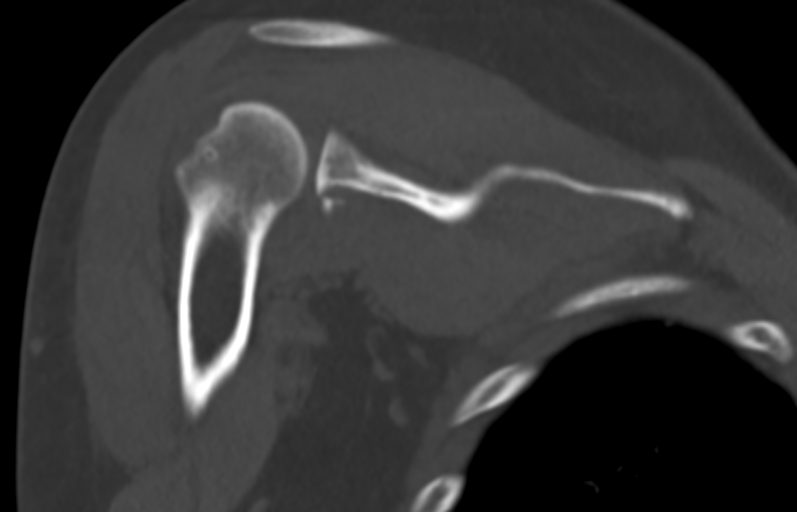

[14 of 20 positions shown; findings below may reference images not displayed]

FINDINGS: Bones/Joint/Cartilage

Mildly comminuted fracture of the inferior third of the glenoid with
5 mm of inferior displacement and 4 mm of anterior displacement. The
fracture involves approximately 40% of the circumference.

Slight flattening of the superior posterolateral humeral head likely
reflecting a Hill-Sachs lesion. Large glenohumeral joint effusion.
Moderate amount of fluid in the subacromial/subdeltoid bursa.

No other acute fracture or dislocation.

Moderate joint space narrowing of right glenohumeral joint with a
small subchondral cyst in the humeral head. Mild arthropathy of the
acromioclavicular joint. No joint effusion. No aggressive osseous
lesion.

Ligaments

Ligaments are suboptimally evaluated by CT.

Muscles and Tendons
Muscles are normal. No muscle atrophy. No intramuscular fluid
collection or hematoma.

Soft tissue
No fluid collection or hematoma. No soft tissue mass. Visualized
right lung is clear. No axillary lymphadenopathy.
IMPRESSION: 1. Mildly comminuted fracture of the inferior third of the glenoid
with 5 mm of inferior displacement and 4 mm of anterior
displacement. Fracture involves approximately 40% of the
circumference.
2. Slight flattening of the superior posterolateral humeral head
likely reflecting a Hill-Sachs lesion.
3. Large glenohumeral joint effusion.
4. Moderate subacromial/subdeltoid bursitis.
5. Moderate right glenohumeral joint space narrowing

## 2021-07-29 DIAGNOSIS — E0590 Thyrotoxicosis, unspecified without thyrotoxic crisis or storm: Secondary | ICD-10-CM | POA: Diagnosis not present

## 2021-08-22 ENCOUNTER — Telehealth: Payer: Self-pay | Admitting: Cardiology

## 2021-08-22 NOTE — Telephone Encounter (Signed)
Left message to call back  

## 2021-08-22 NOTE — Telephone Encounter (Signed)
Pt c/o swelling: STAT is pt has developed SOB within 24 hours ? ?How much weight have you gained and in what time span?  ?About 2 lbs over the past few weeks ? ?If swelling, where is the swelling located?  ?In her abdomen and sometimes her ankles ? ?Are you currently taking a fluid pill?  ?No, but patient would like to have one prescribed ? ?Are you currently SOB?  ?No  ? ?Do you have a log of your daily weights (if so, list)?  ?No log available ? ?Have you gained 3 pounds in a day or 5 pounds in a week?  ?No  ? ?Have you traveled recently?  ? ?No  ?

## 2021-08-25 NOTE — Telephone Encounter (Signed)
Left message for pt to call.

## 2021-09-04 NOTE — Telephone Encounter (Signed)
Patient reports going from 130 to 136 pounds over the past 3-4 weeks. She gets SOB when walking from restaurant to her car. Her clothes fit tighter around her waist. Minimal ankle edema bilaterally. She is taking medications as ordered. Please advise.

## 2021-09-07 NOTE — Telephone Encounter (Signed)
Lets start furosemide 20 mg daily - take 2 tab daily x 3 days then reduce to 1 tab.  Would like for her to have a BNP & CMP checked early this week.  Glenetta Hew, MD

## 2021-09-09 ENCOUNTER — Other Ambulatory Visit: Payer: Self-pay

## 2021-09-09 DIAGNOSIS — R609 Edema, unspecified: Secondary | ICD-10-CM

## 2021-09-09 MED ORDER — FUROSEMIDE 20 MG PO TABS: ORAL_TABLET | ORAL | 1 refills | Status: DC

## 2021-09-09 NOTE — Telephone Encounter (Signed)
Informed patient of order for furosemide 20 mg to start with 40 mg for 3 days then 20 mg thereafter. BNP and CMP next Monday. Patient repeated this in her own words. Orders placed.

## 2021-09-10 DIAGNOSIS — N1831 Chronic kidney disease, stage 3a: Secondary | ICD-10-CM | POA: Diagnosis not present

## 2021-09-10 DIAGNOSIS — E782 Mixed hyperlipidemia: Secondary | ICD-10-CM | POA: Diagnosis not present

## 2021-09-10 DIAGNOSIS — E0590 Thyrotoxicosis, unspecified without thyrotoxic crisis or storm: Secondary | ICD-10-CM | POA: Diagnosis not present

## 2021-09-10 DIAGNOSIS — I5022 Chronic systolic (congestive) heart failure: Secondary | ICD-10-CM | POA: Diagnosis not present

## 2021-09-16 ENCOUNTER — Ambulatory Visit: Payer: Medicare Other | Admitting: Cardiology

## 2021-09-23 DIAGNOSIS — R609 Edema, unspecified: Secondary | ICD-10-CM | POA: Diagnosis not present

## 2021-09-23 DIAGNOSIS — R601 Generalized edema: Secondary | ICD-10-CM | POA: Diagnosis not present

## 2021-09-24 LAB — COMPREHENSIVE METABOLIC PANEL
ALT: 44 IU/L — ABNORMAL HIGH (ref 0–32)
AST: 51 IU/L — ABNORMAL HIGH (ref 0–40)
Albumin/Globulin Ratio: 1.3 (ref 1.2–2.2)
Albumin: 4.3 g/dL (ref 3.8–4.8)
Alkaline Phosphatase: 69 IU/L (ref 44–121)
BUN/Creatinine Ratio: 9 — ABNORMAL LOW (ref 12–28)
BUN: 8 mg/dL (ref 8–27)
Bilirubin Total: 1.1 mg/dL (ref 0.0–1.2)
CO2: 23 mmol/L (ref 20–29)
Calcium: 9.2 mg/dL (ref 8.7–10.3)
Chloride: 104 mmol/L (ref 96–106)
Creatinine, Ser: 0.94 mg/dL (ref 0.57–1.00)
Globulin, Total: 3.2 g/dL (ref 1.5–4.5)
Glucose: 102 mg/dL — ABNORMAL HIGH (ref 70–99)
Potassium: 4.5 mmol/L (ref 3.5–5.2)
Sodium: 141 mmol/L (ref 134–144)
Total Protein: 7.5 g/dL (ref 6.0–8.5)
eGFR: 67 mL/min/{1.73_m2} (ref >59)

## 2021-09-24 LAB — BRAIN NATRIURETIC PEPTIDE: BNP: 82.6 pg/mL (ref 0.0–100.0)

## 2021-10-01 ENCOUNTER — Other Ambulatory Visit: Payer: Self-pay | Admitting: Cardiology

## 2021-11-28 DIAGNOSIS — G894 Chronic pain syndrome: Secondary | ICD-10-CM | POA: Diagnosis not present

## 2021-12-11 DIAGNOSIS — E559 Vitamin D deficiency, unspecified: Secondary | ICD-10-CM | POA: Diagnosis not present

## 2021-12-11 DIAGNOSIS — E782 Mixed hyperlipidemia: Secondary | ICD-10-CM | POA: Diagnosis not present

## 2021-12-11 DIAGNOSIS — E538 Deficiency of other specified B group vitamins: Secondary | ICD-10-CM | POA: Diagnosis not present

## 2021-12-11 DIAGNOSIS — E0590 Thyrotoxicosis, unspecified without thyrotoxic crisis or storm: Secondary | ICD-10-CM | POA: Diagnosis not present

## 2021-12-11 DIAGNOSIS — R7303 Prediabetes: Secondary | ICD-10-CM | POA: Diagnosis not present

## 2021-12-14 ENCOUNTER — Other Ambulatory Visit: Payer: Self-pay | Admitting: Physician Assistant

## 2021-12-19 DIAGNOSIS — G894 Chronic pain syndrome: Secondary | ICD-10-CM | POA: Diagnosis not present

## 2021-12-19 DIAGNOSIS — I2510 Atherosclerotic heart disease of native coronary artery without angina pectoris: Secondary | ICD-10-CM | POA: Diagnosis not present

## 2021-12-19 DIAGNOSIS — R7303 Prediabetes: Secondary | ICD-10-CM | POA: Diagnosis not present

## 2021-12-19 DIAGNOSIS — R809 Proteinuria, unspecified: Secondary | ICD-10-CM | POA: Diagnosis not present

## 2021-12-19 DIAGNOSIS — I5022 Chronic systolic (congestive) heart failure: Secondary | ICD-10-CM | POA: Diagnosis not present

## 2021-12-19 DIAGNOSIS — E538 Deficiency of other specified B group vitamins: Secondary | ICD-10-CM | POA: Diagnosis not present

## 2021-12-19 DIAGNOSIS — N1831 Chronic kidney disease, stage 3a: Secondary | ICD-10-CM | POA: Diagnosis not present

## 2021-12-19 DIAGNOSIS — J4520 Mild intermittent asthma, uncomplicated: Secondary | ICD-10-CM | POA: Diagnosis not present

## 2021-12-19 DIAGNOSIS — M79605 Pain in left leg: Secondary | ICD-10-CM | POA: Diagnosis not present

## 2021-12-19 DIAGNOSIS — I739 Peripheral vascular disease, unspecified: Secondary | ICD-10-CM | POA: Diagnosis not present

## 2021-12-19 DIAGNOSIS — Z0000 Encounter for general adult medical examination without abnormal findings: Secondary | ICD-10-CM | POA: Diagnosis not present

## 2021-12-19 DIAGNOSIS — M5451 Vertebrogenic low back pain: Secondary | ICD-10-CM | POA: Diagnosis not present

## 2022-01-14 ENCOUNTER — Ambulatory Visit: Payer: Medicare Other | Attending: Cardiology | Admitting: Cardiology

## 2022-01-14 ENCOUNTER — Encounter: Payer: Self-pay | Admitting: Cardiology

## 2022-01-14 VITALS — BP 122/72 | HR 71 | Ht <= 58 in | Wt 140.0 lb

## 2022-01-14 DIAGNOSIS — I700 Atherosclerosis of aorta: Secondary | ICD-10-CM | POA: Diagnosis not present

## 2022-01-14 DIAGNOSIS — Z01818 Encounter for other preprocedural examination: Secondary | ICD-10-CM | POA: Diagnosis not present

## 2022-01-14 DIAGNOSIS — I2510 Atherosclerotic heart disease of native coronary artery without angina pectoris: Secondary | ICD-10-CM | POA: Diagnosis not present

## 2022-01-14 DIAGNOSIS — I255 Ischemic cardiomyopathy: Secondary | ICD-10-CM | POA: Diagnosis not present

## 2022-01-14 DIAGNOSIS — Z955 Presence of coronary angioplasty implant and graft: Secondary | ICD-10-CM | POA: Diagnosis not present

## 2022-01-14 DIAGNOSIS — I10 Essential (primary) hypertension: Secondary | ICD-10-CM | POA: Diagnosis not present

## 2022-01-14 DIAGNOSIS — E785 Hyperlipidemia, unspecified: Secondary | ICD-10-CM

## 2022-01-14 MED ORDER — METOPROLOL SUCCINATE ER 25 MG PO TB24: 25.0000 mg | ORAL_TABLET | Freq: Every day | ORAL | 3 refills | Status: DC

## 2022-01-14 MED ORDER — CLOPIDOGREL BISULFATE 75 MG PO TABS: 75.0000 mg | ORAL_TABLET | Freq: Every day | ORAL | 3 refills | Status: DC

## 2022-01-14 NOTE — Progress Notes (Signed)
Primary Care Provider: Celene Squibb, [place] Cardiologist: Glenetta Hew, MD Electrophysiologist: None.  Referring Provider: Celene Squibb, MD  Clinic Note: Chief Complaint  Patient presents with   Follow-up    Overall doing well.  Notes bruising.   Coronary Artery Disease    No further angina.   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  Thankfully repeat echo was notably improved with a EF of 50 to 55%.  There is still some akinesis and hypokinesis but only GR 1 DD.  No active angina.  Only some mild early exercise intolerance of get up and go issues.  But not while she is going. She does have some bruising.  Plan: Continue current dose of atorvastatin, Entresto and Toprol With daytime fatigue-change Toprol dose interval to p.m. dosing. Continue spironolactone. Convert from Effient to Plavix: (Would not want to go more than 1 year or with Effient, and she is having bruising. Complete current bottle of Effient, THEN we will switch to clopidogrel/Plavix 75 mg daily.   First dose will be 4 tablets (300 mg) then      Relevant Medications   metoprolol succinate (TOPROL-XL) 25 MG 24 hr tablet   Other Relevant Orders   EKG 12-Lead (Completed)   Ischemic cardiomyopathy (Chronic)    Thankfully, EF bounced back into the normal range.  Continue guideline directed therapy with Entresto, Toprol and spironolactone.   Euvolemic.  She has furosemide that she takes 20 mg daily to avoid volume overload      Relevant Medications   metoprolol succinate (TOPROL-XL) 25 MG 24 hr tablet   Other Relevant Orders   EKG 12-Lead (Completed)   Essential hypertension (Chronic)    Stable BP on current meds: Entresto 24-26 mg daily, Toprol 25 mg daily,  spironolactone 12.5 mg daily.      Relevant Medications   metoprolol succinate (TOPROL-XL) 25 MG 24 hr tablet   Hyperlipidemia with target LDL less than 70 (Chronic)    Most recent LDL was 44 current dose of statin.  No change.  Continue atorvastatin 40 mg daily.  Tolerating well.      Relevant Medications   metoprolol succinate (TOPROL-XL) 25 MG 24 hr tablet   Atherosclerosis of aorta (HCC) (Chronic)    Seen on radiographic imaging.  She is already on cardiovascular risk reduction medications for CAD.  No change. We can do screening for AAA in the next year or so.      Relevant Medications   metoprolol succinate (TOPROL-XL) 25 MG 24 hr tablet     Other   Presence of drug coated stent in LAD coronary artery (Chronic)    Now on SAPT with Effient alone. ->  Beyond 1 year from initial PCI, would like to switch from Brilinta,  especially because she has bruising.  Converting from Brilinta to long-term Plavix 75 mg daily.  (First dose to be 300 mg).  This can be filled when she calls in near the end of her current Effient prescription.  Okay to hold Thienopyridine for procedures or surgeries:  Hold the EFFIENT ( PRASUGREL)  FOR 10 DAYS PRIOR TO PROCEDURE IF YOU ARE STILL TAKING IT . OR  5 DAYS IF YOU ARE TAKING THE CLOPIDOGREL (  PLAVIX)       Preoperative clearance - Primary    Recently revascularized with no anginal symptoms.  Stable regimen including beta-blocker. Colonoscopy is a low risk procedure, in the absence of symptoms, no need for any cardiac evaluation pri  or to colonoscopy.  She is on Thienopyridine currently taking Effient but open to switch to Plavix.  Rules for holding include: Hold the EFFIENT ( PRASUGREL)  FOR 10 DAYS PRIOR TO PROCEDURE IF YOU ARE STILL TAKING IT . OR  5 DAYS IF YOU ARE TAKING THE CLOPIDOGREL ( PLAVIX)      ===================================  HPI:    Amber Peterson is a 66 y.o. female with a PMH below who presents today for delayed 16-month follow-up.  CAD-Anterior STEMI (LAD PCI,- staged LCx-OM2; initial ICM), HTN, and HLD  2 Vessel CAD: s/p DES PCI of LAD & LCx-OM2  Initial EF 30-35%, LAD distribution HK Echo: 03/24/21: (ICM Resolved) EF 55-60%. LV with regional wall motion abnormalities. Apical septal segments and apex are akinetic. Mid segments are hypokinetic. Mild concentric LVH. Grade 1 DD.   Amber SCOTTIwas last seen on 04/22/2021 D/c'd  Brilinta-converted to Effient Stopped Spironolactone => continued Entresto & Toprol CMP/Lipds ordered  Recent Hospitalizations: None  Reviewed  CV studies:    The following studies were reviewed today: (if available, images/films reviewed: From Epic Chart or Care Everywhere) None:  Interval History:   Amber DISBROreturns today for follow-up stating that she is doing pretty well.  Major issue is that she just feels like she takes longer to usually get up and go to doing things.  She feels like she has to wind herself up.  Once she is going she is okay she does not really notice exertional dyspnea.  No chest pain or pressure.  No PND orthopnea edema. No lightheadedness, dizziness or wooziness.  No syncope/near syncope or TIA/versus fugax.  No claudication.  She does note relatively significant bruising.  No melena, hematochezia hematuria or epistaxis.  No frank bleeding either GI bleeding or wound bleeding.  She is due for colonoscopy.  Preop clearance and questions about holding medications.  REVIEWED OF SYSTEMS   Review of Systems  Constitutional:  Negative for malaise/fatigue (Just notes that it takes a "lot to get going.  Once she gets going she is okay.).  HENT:  Negative for congestion and nosebleeds.   Respiratory:  Negative for cough and shortness of breath.   Cardiovascular:        Per HPI  Gastrointestinal:  Negative for abdominal pain, blood in stool, constipation and melena.  Genitourinary:  Negative for hematuria.  Musculoskeletal: Negative.  Negative for  joint pain and myalgias.  Neurological:  Negative for dizziness and headaches.  Endo/Heme/Allergies:  Bruises/bleeds easily.  Psychiatric/Behavioral:  Negative for depression and memory loss. The patient is nervous/anxious. The patient does not have insomnia.    I have reviewed and (if needed) personally updated the patient's problem list, medications, allergies, past medical and surgical history, social and family history.   PAST MEDICAL HISTORY   Past Medical History:  Diagnosis Date   Acute ST elevation myocardial infarction (STEMI) due to occlusion of distal portion of left anterior descending (LAD) coronary artery (HCC) 12/08/2020   100 % prox-mid LAD - DES PCI (also staged PCI-PTCA of LCx-OM2 (stent)-AVGVCx (PTCA).  Initial EF 30-35% -> Improved to 55-60% on 4 month f/u Echo.   Anxiety    Arthritis    all major joint  Asthma 07/22/2017   Chronic headaches    Depression    Hepatitis C 07/22/2017   History of kidney stones    Hypertension    Ischemic cardiomyopathy - RESOLVED 12/10/2020   EF 30 to 35% with anterior, anterolateral and inferior hypokinesis to akinesis consistent with LAD infarct. -> 4 month Post-MI / PCI Echo - EF 55-60%.   ~2 Vessel CORONARY ARTERY DISEASE -  LAD & LCx-OM2 12/08/2020   12/08/2020: 100 % mLAD - DES PCI Onyx Frontier DES 2.5 x 26; 80%-99% bifurcation LCx-OM/AVG Cx (Sttaged PCI)> DES PCI Prox-Mid Cx 25%->OM2 90% -> DES PCI ONYX FRONTIER 2.0 x 15 -> 2.3 mm; PTCA of AVG LCx (thrombotic 99%) -> 1.5 mm Balloon PTCA (through stent) - 20% residual.    PAST SURGICAL HISTORY   Past Surgical History:  Procedure Laterality Date   ABDOMINAL HYSTERECTOMY  1995   ANKLE FRACTURE SURGERY Left 01/26/2021   and leg   CORONARY BALLOON ANGIOPLASTY N/A 12/10/2020   Procedure: CORONARY BALLOON ANGIOPLASTY;  Surgeon: Troy Sine, MD;  Location: [facility];  Service: Cardiovascular;; STAGED Bifurcation LCx-OM2-AVGCx: PTCA of AVG LCx (thrombotic 99%) -> 1.5  mm Balloon PTCA (through stent into OM2) - 20% residual.   CORONARY STENT INTERVENTION N/A 12/08/2020   Procedure: CORONARY STENT INTERVENTION;  Surgeon: Lorretta Harp, MD;  Location: [facility];  Service: Cardiovascular;; Prox-Mid LAD 100% (DES PCI ONYX FRONTIER 2.5 x 26 mm -2.6 mm   CORONARY STENT INTERVENTION N/A 12/10/2020   Procedure: CORONARY STENT INTERVENTION;  Surgeon: Troy Sine, MD;  Location: [facility];  Service: Cardiovascular;; STAGED DES PCI Prox-Mid Cx 25%->OM2 90% -> DES PCI ONYX FRONTIER 2.0 x 15 -> 2.3 mm   CORONARY/GRAFT ACUTE MI REVASCULARIZATION N/A 12/08/2020   Procedure: Coronary/Graft Acute MI Revascularization;  Surgeon: Lorretta Harp, MD;  Location: [facility];  Service: Cardiovascular;  Laterality: N/A;   LEFT HEART CATH N/A 12/10/2020   Procedure: Left Heart Cath;  Surgeon: Troy Sine, MD;  Location: [facility];  Service: Cardiovascular; Catheter spasm of LM on initial Guide placement. -> Smooth ~50% prox LAD (pre-stent), patent stent.  30% Ost LCx, 25% m LCx, Ost OM2 90% & thrombotic AVG LCx 99% LVEDP 13 mmHg   LEFT HEART CATH AND CORONARY ANGIOGRAPHY N/A 12/08/2020   Procedure: LEFT HEART CATH AND CORONARY ANGIOGRAPHY;  Surgeon: Lorretta Harp, MD;  Location: [facility];  Service: Cardiovascular; (Ant STEMI): Prox RCA 30%. Short LM. Prox-mid LCx 80% (actually into OM2) & AVG LCVx 99%; Prox-Mid LAD 100%  (DES PCI LAD, Staged PCI of LCx)   REVERSE SHOULDER ARTHROPLASTY Right 03/22/2019   Procedure: REVERSE SHOULDER ARTHROPLASTY;  Surgeon: Hiram Gash, MD;  Location: [facility];  Service: Orthopedics;  Laterality: Right;   TONSILLECTOMY  2000   TRANSTHORACIC ECHOCARDIOGRAM  12/09/2020   (Ant STEMI on 12/08/2020: EF 30 to 35%.  Moderate reduced EF.  Mid-apical anterior, anteroseptal and inferoseptal AK.  Also apical inferior, apical lateral and apical akinesis.  GR 1 DD.  Mild LA dilation.  Normal RV size, function  and RVP, RAP.Marland Kitchen  Normal valves.   TRANSTHORACIC ECHOCARDIOGRAM  03/24/2021   (3 months post Anterior STEMI with LAD and LCx PCI w/ initial) EF 30-35%) : EF IMPROVED to 55-60%. LV with regional wall motion abnormalities. Apical septal segments and apex are akinetic. Mid segments are hypokinetic. Mild concentric LVH. Grade 1 DD.   Cardiac Cath-Acute PCI  of LAD (12/08/2020): Prox-mid LAD 100% (DES PCI) Prox RCA 30%. Short LM. Prox-mid LCx 80% (actually into OM2) & AVG LCVx 99%; Prox-Mid LAD 100%  Prox-Mid LAD 100% (DES PCI ONYX FRONTIER 2.5 x 26 mm -2.6 mm - see staged PCI diagram)    Cardiac Cath - Staged PCI LCx-OM2, PTCA AVG LCx (12/10/2020): Catheter spasm of LM on initial Guide placement. -> Smooth ~50% prox LAD (pre-stent), patent stent.  30% Ost LCx, 25% m LCx, Ost OM2 90% & thrombotic AVG LCx 99% LVEDP 13 mmHg DES PCI Prox-Mid Cx 25%->OM2 90% -> DES PCI ONYX FRONTIER 2.0 x 15 -> 2.3 mm PTCA of AVG LCx (thrombotic 99%) -> 1.5 mm Balloon PTCA (through stent) - 20% residual.         There is no immunization history on file for this patient.  MEDICATIONS/ALLERGIES   Current Meds  Medication Sig   acetaminophen (TYLENOL) 500 MG tablet Take 2,000 mg by mouth as needed for moderate pain.   albuterol (VENTOLIN HFA) 108 (90 Base) MCG/ACT inhaler Inhale 2 puffs into the lungs every 6 (six) hours as needed for wheezing or shortness of breath.   ALPRAZolam (XANAX) 0.5 MG tablet Take 0.5 mg by mouth 2 (two) times daily.   Ascorbic Acid (VITAMIN C PO) Take 1 tablet by mouth daily.   atorvastatin (LIPITOR) 40 MG tablet TAKE 1 TABLET BY MOUTH EVERY DAY   Biotin 1000 MCG tablet Take 1,000 mcg by mouth daily.   budesonide-formoterol (SYMBICORT) 160-4.5 MCG/ACT inhaler Inhale 2 puffs into the lungs 2 (two) times daily.   Cholecalciferol (DIALYVITE VITAMIN D 5000 PO) Take 5,000 Units by mouth daily.   ENTRESTO 24-26 MG TAKE 1 TABLET BY MOUTH TWICE A DAY   furosemide (LASIX) 20 MG tablet Take 2 tablets  (40 mg) for 3 days, then take 1 tablet (20 mg) daily.   gabapentin (NEURONTIN) 300 MG capsule Take 1 capsule (300 mg total) by mouth 3 (three) times daily. (Patient taking differently: Take 300 mg by mouth 3 (three) times daily as needed (pain).)   HYDROcodone-acetaminophen (NORCO/VICODIN) 5-325 MG tablet Take 1 tablet by mouth every 8 (eight) hours.   hydrocortisone cream 1 % Apply 1 application  topically daily as needed for itching.   metoprolol succinate (TOPROL-XL) 25 MG 24 hr tablet TAKE 1 TABLET (25 MG TOTAL) BY MOUTH DAILY.   omeprazole (PRILOSEC) 40 MG capsule Take 1 capsule (40 mg total) by mouth daily before breakfast.   prasugrel (EFFIENT) 10 MG TABS tablet Take 1 tablet (10 mg total) by mouth daily. Do NOT take with Brilinta.   spironolactone (ALDACTONE) 25 MG tablet Take 12.5 mg by mouth daily.   Tetrahydrozoline HCl (VISINE OP) Place 1 drop into both eyes daily as needed (dry eyes).   thyroid (ARMOUR) 15 MG tablet Take 15 mg by mouth daily.   traZODone (DESYREL) 50 MG tablet Take 150 mg by mouth at bedtime.   venlafaxine XR (EFFEXOR-XR) 150 MG 24 hr capsule Take 150 mg by mouth daily with breakfast.   vitamin B-12 (CYANOCOBALAMIN) 1000 MCG tablet Take 1,000 mcg by mouth daily.   zinc gluconate 50 MG tablet Take 50 mg by mouth daily.    Allergies  Allergen Reactions   Shellfish Allergy Anaphylaxis   Other Other (See Comments)    Allergic to hackleberrys per allergy test Walnuts cause asthma/shortness of breath   Penicillins Hives and Itching    Did it involve swelling of the face/tongue/throat, SOB, or low BP? No Did  it involve sudden or severe rash/hives, skin peeling, or any reaction on the inside of your mouth or nose? No Did you need to seek medical attention at a hospital or doctor's office? No When did it last happen?      65 years old If all above answers are "NO", may proceed with cephalosporin use.     Sulfa Antibiotics Hives and Itching   Tape     Causes  blisters     SOCIAL HISTORY/FAMILY HISTORY   Reviewed in Epic:  Pertinent findings:  Social History   Tobacco Use   Smoking status: Former    Types: Cigarettes    Quit date: 1988    Years since quitting: 35.8   Smokeless tobacco: Never  Vaping Use   Vaping Use: Never used  Substance Use Topics   Alcohol use: Not Currently    Comment: occasional   Drug use: Never   Social History   Social History Narrative   Not on file    OBJCTIVE -PE, EKG, labs   Wt Readings from Last 3 Encounters:  01/14/22 140 lb (63.5 kg)  07/03/21 133 lb (60.3 kg)  06/12/21 133 lb 8 oz (60.6 kg)    Physical Exam: BP 122/72   Pulse 71   Ht '4\' 10"'$  (1.473 m)   Wt 140 lb (63.5 kg)   SpO2 97%   BMI 29.26 kg/m  Physical Exam Vitals reviewed.  Constitutional:      General: She is not in acute distress.    Appearance: Normal appearance. She is not toxic-appearing.     Comments: Borderline obese.  Well-groomed.  Well-nourished.  Healthy-appearing.  HENT:     Head: Normocephalic and atraumatic.  Neck:     Vascular: No carotid bruit, hepatojugular reflux or JVD.  Cardiovascular:     Rate and Rhythm: Normal rate and regular rhythm. No extrasystoles are present.    Chest Wall: PMI is not displaced.     Pulses: Normal pulses.     Heart sounds: Normal heart sounds, S1 normal and S2 normal. No murmur heard.    No friction rub. No gallop.  Pulmonary:     Effort: Pulmonary effort is normal. No respiratory distress.     Breath sounds: Normal breath sounds. No wheezing, rhonchi or rales.  Musculoskeletal:        General: Normal range of motion.     Cervical back: Normal range of motion and neck supple.  Skin:    General: Skin is warm and dry.  Neurological:     General: No focal deficit present.     Mental Status: She is alert.     Gait: Gait normal.  Psychiatric:        Mood and Affect: Mood normal.        Behavior: Behavior normal.        Thought Content: Thought content normal.         Judgment: Judgment normal.     Adult ECG Report  Rate: 71 ;  Rhythm: normal sinus rhythm, sinus arrhythmia, and nonspecific ST-T wave changes. ;   Narrative Interpretation: Borderline  Recent Labs: 12/11/2021: TC 110, HDL 45, LDL 44, TG 118.  A1c 6.2.  Hgb 13.7, CR 0.99, K+ 4.9.  TSH 2.72.  INR 1.1.   ================================================== I spent a total of 10mnutes with the patient spent in direct patient consultation.  Additional time spent with chart review  / charting (studies, outside notes, etc): 16 min Total Time: 38 min  Current  medicines are reviewed at length with the patient today.  (+/- concerns) N/A  Notice: This dictation was prepared with Dragon dictation along with smart phrase technology. Any transcriptional errors that result from this process are unintentional and may not be corrected upon review.  Studies Ordered:   Orders Placed This Encounter  Procedures   EKG 12-Lead   Meds ordered this encounter  Medications   metoprolol succinate (TOPROL-XL) 25 MG 24 hr tablet    Sig: Take 1 tablet (25 mg total) by mouth daily with supper.    Dispense:  90 tablet    Refill:  3   clopidogrel (PLAVIX) 75 MG tablet    Sig: Take 1 tablet (75 mg total) by mouth daily. Do not start until you finish taking Effient    Dispense:  90 tablet    Refill:  3    Do not start  until you complete  taking  Effient    Patient Instructions / Medication Changes & Studies & Tests Ordered   Patient Instructions  Medication Instructions:     Elizebeth Koller taking the EFFIENT ( PRASUGREL)  once  you finish  call the office - we will call in prescription for Clopidogrel 75 mg one tablet daily     *If you need a refill on your cardiac medications before your next appointment, please call your pharmacy*  Other Instructions   Per  Dr Ellyn Hack you can have   your colonoscopy  - you will need to Hold the EFFIENT ( PRASUGREL)  FOR 10 DAYS PRIOR TO PROCEDURE IF YOU ARE STILL TAKING IT .  OR  5 DAYS IF YOU ARE TAKING THE CLOPIDOGREL ( PLAVIX)    Lab Work: Not needed    Testing/Procedures: Not needed   Follow-Up: At [facility], you and your health needs are our priority.  As part of our continuing mission to provide you with exceptional heart care, we have created designated Provider Care Teams.  These Care Teams include your primary Cardiologist (physician) and Advanced Practice Providers (APPs -  Physician Assistants and Nurse Practitioners) who all work together to provide you with the care you need, when you need it.     Your next appointment:   6 month(s)  The format for your next appointment:   In Person  Provider:   Glenetta Hew, MD    Other Instructions   Per  Dr Ellyn Hack you can have   your colonoscopy  - you will need to Hold the EFFIENT ( PRASUGREL)  FOR 10 DAYS PRIOR TO PROCEDURE IF YOU ARE STILL TAKING IT . OR  5 DAYS IF YOU ARE TAKING THE CLOPIDOGREL ( PLAVIX)      Leonie Man, MD, MS Glenetta Hew, M.D., M.S. Interventional Cardiologist  [facility]  Pager # [phone] Phone # [phone] [address] [place], [place] [address]   Thank you for choosing [facility]!!

## 2022-01-14 NOTE — Patient Instructions (Addendum)
Medication Instructions:     Amber Peterson taking the EFFIENT ( PRASUGREL)  once  you finish  call the office - we will call in prescription for Clopidogrel 75 mg one tablet daily     *If you need a refill on your cardiac medications before your next appointment, please call your pharmacy*  Other Instructions   Per  Dr Ellyn Hack you can have   your colonoscopy  - you will need to Hold the EFFIENT ( PRASUGREL)  FOR 10 DAYS PRIOR TO PROCEDURE IF YOU ARE STILL TAKING IT . OR  5 DAYS IF YOU ARE TAKING THE CLOPIDOGREL ( PLAVIX)    Lab Work: Not needed    Testing/Procedures: Not needed   Follow-Up: At [facility], you and your health needs are our priority.  As part of our continuing mission to provide you with exceptional heart care, we have created designated Provider Care Teams.  These Care Teams include your primary Cardiologist (physician) and Advanced Practice Providers (APPs -  Physician Assistants and Nurse Practitioners) who all work together to provide you with the care you need, when you need it.     Your next appointment:   6 month(s)  The format for your next appointment:   In Person  Provider:   Glenetta Hew, MD    Other Instructions   Per  Dr Ellyn Hack you can have   your colonoscopy  - you will need to Hold the EFFIENT ( PRASUGREL)  FOR 10 DAYS PRIOR TO PROCEDURE IF YOU ARE STILL TAKING IT . OR  5 DAYS IF YOU ARE TAKING THE CLOPIDOGREL ( PLAVIX)

## 2022-02-01 ENCOUNTER — Encounter: Payer: Self-pay | Admitting: Cardiology

## 2022-02-01 DIAGNOSIS — Z01818 Encounter for other preprocedural examination: Secondary | ICD-10-CM | POA: Insufficient documentation

## 2022-02-01 NOTE — Assessment & Plan Note (Signed)
Stable BP on current meds: Entresto 24-26 mg daily, Toprol 25 mg daily, spironolactone 12.5 mg daily.

## 2022-02-01 NOTE — Assessment & Plan Note (Signed)
Seen on radiographic imaging.  She is already on cardiovascular risk reduction medications for CAD.  No change. We can do screening for AAA in the next year or so.

## 2022-02-01 NOTE — Assessment & Plan Note (Signed)
Now on SAPT with Effient alone. ->  Beyond 1 year from initial PCI, would like to switch from [place],  especially because she has bruising.  Converting from Brilinta to long-term Plavix 75 mg daily.  (First dose to be 300 mg).  This can be filled when she calls in near the end of her current Effient prescription.  Okay to hold Thienopyridine for procedures or surgeries:  Hold the EFFIENT ( PRASUGREL)  FOR 10 DAYS PRIOR TO PROCEDURE IF YOU ARE STILL TAKING IT . OR  5 DAYS IF YOU ARE TAKING THE CLOPIDOGREL ( PLAVIX)

## 2022-02-01 NOTE — Assessment & Plan Note (Signed)
Most recent LDL was 44 current dose of statin.  No change.  Continue atorvastatin 40 mg daily.  Tolerating well.

## 2022-02-01 NOTE — Assessment & Plan Note (Signed)
Thankfully, EF bounced back into the normal range.  Continue guideline directed therapy with Entresto, Toprol and spironolactone.   Euvolemic.  She has furosemide that she takes 20 mg daily to avoid volume overload

## 2022-02-01 NOTE — Assessment & Plan Note (Signed)
Recently revascularized with no anginal symptoms.  Stable regimen including beta-blocker. Colonoscopy is a low risk procedure, in the absence of symptoms, no need for any cardiac evaluation pri  or to colonoscopy.  She is on Thienopyridine currently taking Effient but open to switch to Plavix.  Rules for holding include: Hold the EFFIENT ( PRASUGREL)  FOR 10 DAYS PRIOR TO PROCEDURE IF YOU ARE STILL TAKING IT . OR  5 DAYS IF YOU ARE TAKING THE CLOPIDOGREL ( PLAVIX)

## 2022-02-01 NOTE — Assessment & Plan Note (Addendum)
Doing well with no further anginal symptoms.  Thankfully repeat echo was notably improved with a EF of 50 to 55%.  There is still some akinesis and hypokinesis but only GR 1 DD.  No active angina.  Only some mild early exercise intolerance of get up and go issues.  But not while she is going. She does have some bruising.  Plan:  Continue current dose of atorvastatin, Entresto and Toprol  With daytime fatigue-change Toprol dose interval to p.m. dosing.  Continue spironolactone.  Convert from Effient to Plavix: (Would not want to go more than 1 year or with Effient, and she is having bruising.  Complete current bottle of Effient, THEN we will switch to clopidogrel/Plavix 75 mg daily.    First dose will be 4 tablets (300 mg) then

## 2022-02-17 ENCOUNTER — Other Ambulatory Visit: Payer: Self-pay | Admitting: Cardiology

## 2022-03-02 ENCOUNTER — Other Ambulatory Visit (HOSPITAL_COMMUNITY): Payer: Self-pay | Admitting: Family Medicine

## 2022-03-02 DIAGNOSIS — Z1231 Encounter for screening mammogram for malignant neoplasm of breast: Secondary | ICD-10-CM | POA: Diagnosis not present

## 2022-03-02 DIAGNOSIS — G894 Chronic pain syndrome: Secondary | ICD-10-CM | POA: Diagnosis not present

## 2022-03-02 DIAGNOSIS — Z13820 Encounter for screening for osteoporosis: Secondary | ICD-10-CM

## 2022-03-19 ENCOUNTER — Ambulatory Visit (HOSPITAL_COMMUNITY)
Admission: RE | Admit: 2022-03-19 | Discharge: 2022-03-19 | Disposition: A | Discharge status: Home / Self Care | Payer: Medicare Other | Source: Ambulatory Visit | Attending: Family Medicine | Admitting: Family Medicine

## 2022-03-19 DIAGNOSIS — M8589 Other specified disorders of bone density and structure, multiple sites: Secondary | ICD-10-CM | POA: Diagnosis not present

## 2022-03-19 DIAGNOSIS — Z13820 Encounter for screening for osteoporosis: Secondary | ICD-10-CM

## 2022-03-19 DIAGNOSIS — Z1231 Encounter for screening mammogram for malignant neoplasm of breast: Secondary | ICD-10-CM | POA: Insufficient documentation

## 2022-03-19 DIAGNOSIS — Z780 Asymptomatic menopausal state: Secondary | ICD-10-CM | POA: Diagnosis not present

## 2022-04-01 ENCOUNTER — Other Ambulatory Visit (HOSPITAL_COMMUNITY): Payer: Self-pay | Admitting: Family Medicine

## 2022-04-01 DIAGNOSIS — R928 Other abnormal and inconclusive findings on diagnostic imaging of breast: Secondary | ICD-10-CM

## 2022-04-07 ENCOUNTER — Ambulatory Visit (HOSPITAL_COMMUNITY)
Admission: RE | Admit: 2022-04-07 | Discharge: 2022-04-07 | Disposition: A | Discharge status: Home / Self Care | Payer: Medicare Other | Source: Ambulatory Visit | Attending: Family Medicine | Admitting: Family Medicine

## 2022-04-07 DIAGNOSIS — R928 Other abnormal and inconclusive findings on diagnostic imaging of breast: Secondary | ICD-10-CM

## 2022-04-07 DIAGNOSIS — N6001 Solitary cyst of right breast: Secondary | ICD-10-CM | POA: Diagnosis not present

## 2022-05-01 ENCOUNTER — Other Ambulatory Visit: Payer: Self-pay | Admitting: Family Medicine

## 2022-05-06 ENCOUNTER — Other Ambulatory Visit: Payer: Self-pay | Admitting: Cardiology

## 2022-06-12 ENCOUNTER — Telehealth: Payer: Self-pay | Admitting: Cardiology

## 2022-06-12 MED ORDER — CLOPIDOGREL BISULFATE 75 MG PO TABS: 75.0000 mg | ORAL_TABLET | Freq: Every day | ORAL | 3 refills | Status: DC

## 2022-06-12 NOTE — Telephone Encounter (Signed)
*  STAT* If patient is at the pharmacy, call can be transferred to refill team.   1. Which medications need to be refilled? (please list name of each medication and dose if known) new prescription for Plavix  2. Which pharmacy/location (including street and city if local pharmacy) is medication to be sent to? [facility] [address], [place]  3. Do they need a 30 day or 90 day supply? 90 days and refills

## 2022-06-12 NOTE — Telephone Encounter (Signed)
Called patient to confirm that she has completed the Effient medication as directed from last visit with Dr. Ellyn Hack. Patient confirmed that she has completed Effient and calling our office for Plavix refill. Rx for Plavix was sent to patient pharmacy on file. Patient verbalized understanding and all (if any) were answered.

## 2022-06-16 DIAGNOSIS — E538 Deficiency of other specified B group vitamins: Secondary | ICD-10-CM | POA: Diagnosis not present

## 2022-06-16 DIAGNOSIS — E782 Mixed hyperlipidemia: Secondary | ICD-10-CM | POA: Diagnosis not present

## 2022-06-16 DIAGNOSIS — E0590 Thyrotoxicosis, unspecified without thyrotoxic crisis or storm: Secondary | ICD-10-CM | POA: Diagnosis not present

## 2022-06-16 DIAGNOSIS — R7303 Prediabetes: Secondary | ICD-10-CM | POA: Diagnosis not present

## 2022-06-22 DIAGNOSIS — Z0001 Encounter for general adult medical examination with abnormal findings: Secondary | ICD-10-CM | POA: Diagnosis not present

## 2022-06-22 DIAGNOSIS — I5022 Chronic systolic (congestive) heart failure: Secondary | ICD-10-CM | POA: Diagnosis not present

## 2022-06-22 DIAGNOSIS — G894 Chronic pain syndrome: Secondary | ICD-10-CM | POA: Diagnosis not present

## 2022-06-22 DIAGNOSIS — E1165 Type 2 diabetes mellitus with hyperglycemia: Secondary | ICD-10-CM | POA: Diagnosis not present

## 2022-06-22 DIAGNOSIS — E538 Deficiency of other specified B group vitamins: Secondary | ICD-10-CM | POA: Diagnosis not present

## 2022-06-22 DIAGNOSIS — I739 Peripheral vascular disease, unspecified: Secondary | ICD-10-CM | POA: Diagnosis not present

## 2022-06-22 DIAGNOSIS — M25561 Pain in right knee: Secondary | ICD-10-CM | POA: Diagnosis not present

## 2022-06-22 DIAGNOSIS — R809 Proteinuria, unspecified: Secondary | ICD-10-CM | POA: Diagnosis not present

## 2022-06-22 DIAGNOSIS — J4520 Mild intermittent asthma, uncomplicated: Secondary | ICD-10-CM | POA: Diagnosis not present

## 2022-06-22 DIAGNOSIS — Z23 Encounter for immunization: Secondary | ICD-10-CM | POA: Diagnosis not present

## 2022-06-22 DIAGNOSIS — E1151 Type 2 diabetes mellitus with diabetic peripheral angiopathy without gangrene: Secondary | ICD-10-CM | POA: Diagnosis not present

## 2022-06-22 DIAGNOSIS — M79605 Pain in left leg: Secondary | ICD-10-CM | POA: Diagnosis not present

## 2022-08-20 ENCOUNTER — Encounter: Payer: Self-pay | Admitting: Gastroenterology

## 2022-09-16 ENCOUNTER — Other Ambulatory Visit: Payer: Self-pay | Admitting: Cardiology

## 2022-10-14 DIAGNOSIS — E538 Deficiency of other specified B group vitamins: Secondary | ICD-10-CM | POA: Diagnosis not present

## 2022-10-14 DIAGNOSIS — E782 Mixed hyperlipidemia: Secondary | ICD-10-CM | POA: Diagnosis not present

## 2022-10-14 DIAGNOSIS — E0590 Thyrotoxicosis, unspecified without thyrotoxic crisis or storm: Secondary | ICD-10-CM | POA: Diagnosis not present

## 2022-10-14 DIAGNOSIS — E1165 Type 2 diabetes mellitus with hyperglycemia: Secondary | ICD-10-CM | POA: Diagnosis not present

## 2022-10-22 DIAGNOSIS — I2510 Atherosclerotic heart disease of native coronary artery without angina pectoris: Secondary | ICD-10-CM | POA: Diagnosis not present

## 2022-10-22 DIAGNOSIS — N1831 Chronic kidney disease, stage 3a: Secondary | ICD-10-CM | POA: Diagnosis not present

## 2022-10-22 DIAGNOSIS — M545 Low back pain, unspecified: Secondary | ICD-10-CM | POA: Diagnosis not present

## 2022-10-22 DIAGNOSIS — E538 Deficiency of other specified B group vitamins: Secondary | ICD-10-CM | POA: Diagnosis not present

## 2022-10-22 DIAGNOSIS — M79605 Pain in left leg: Secondary | ICD-10-CM | POA: Diagnosis not present

## 2022-10-22 DIAGNOSIS — E1165 Type 2 diabetes mellitus with hyperglycemia: Secondary | ICD-10-CM | POA: Diagnosis not present

## 2022-10-22 DIAGNOSIS — I255 Ischemic cardiomyopathy: Secondary | ICD-10-CM | POA: Diagnosis not present

## 2022-10-22 DIAGNOSIS — I739 Peripheral vascular disease, unspecified: Secondary | ICD-10-CM | POA: Diagnosis not present

## 2022-10-22 DIAGNOSIS — J4520 Mild intermittent asthma, uncomplicated: Secondary | ICD-10-CM | POA: Diagnosis not present

## 2022-10-22 DIAGNOSIS — R809 Proteinuria, unspecified: Secondary | ICD-10-CM | POA: Diagnosis not present

## 2022-10-22 DIAGNOSIS — I5022 Chronic systolic (congestive) heart failure: Secondary | ICD-10-CM | POA: Diagnosis not present

## 2022-10-22 DIAGNOSIS — G894 Chronic pain syndrome: Secondary | ICD-10-CM | POA: Diagnosis not present

## 2022-12-13 ENCOUNTER — Other Ambulatory Visit: Payer: Self-pay | Admitting: Cardiology

## 2022-12-28 DIAGNOSIS — E039 Hypothyroidism, unspecified: Secondary | ICD-10-CM | POA: Diagnosis not present

## 2023-01-13 ENCOUNTER — Encounter: Payer: Self-pay | Admitting: Cardiology

## 2023-01-13 ENCOUNTER — Ambulatory Visit: Payer: Medicare Other | Attending: Cardiology | Admitting: Cardiology

## 2023-01-13 VITALS — BP 99/65 | HR 64 | Ht <= 58 in | Wt 132.6 lb

## 2023-01-13 DIAGNOSIS — I951 Orthostatic hypotension: Secondary | ICD-10-CM | POA: Diagnosis not present

## 2023-01-13 DIAGNOSIS — Z955 Presence of coronary angioplasty implant and graft: Secondary | ICD-10-CM

## 2023-01-13 DIAGNOSIS — E118 Type 2 diabetes mellitus with unspecified complications: Secondary | ICD-10-CM

## 2023-01-13 DIAGNOSIS — E785 Hyperlipidemia, unspecified: Secondary | ICD-10-CM

## 2023-01-13 DIAGNOSIS — I2109 ST elevation (STEMI) myocardial infarction involving other coronary artery of anterior wall: Secondary | ICD-10-CM

## 2023-01-13 DIAGNOSIS — I10 Essential (primary) hypertension: Secondary | ICD-10-CM | POA: Diagnosis not present

## 2023-01-13 DIAGNOSIS — I2510 Atherosclerotic heart disease of native coronary artery without angina pectoris: Secondary | ICD-10-CM

## 2023-01-13 DIAGNOSIS — I255 Ischemic cardiomyopathy: Secondary | ICD-10-CM | POA: Diagnosis not present

## 2023-01-13 DIAGNOSIS — I700 Atherosclerosis of aorta: Secondary | ICD-10-CM

## 2023-01-13 MED ORDER — NITROGLYCERIN 0.4 MG SL SUBL: 0.4000 mg | SUBLINGUAL_TABLET | SUBLINGUAL | 3 refills | Status: AC | PRN

## 2023-01-13 MED ORDER — SPIRONOLACTONE 25 MG PO TABS: 12.5000 mg | ORAL_TABLET | Freq: Every day | ORAL | Status: DC

## 2023-01-13 NOTE — Progress Notes (Unsigned)
Cardiology Office Note:  .   Date:  01/18/2023  ID:  Amber Peterson, DOB 1955/11/14, MRN [id number] PCP: Benita Stabile, MD  [facility] Providers Cardiologist:  Bryan Lemma, MD     Chief Complaint  Patient presents with   Follow-up    Annual - doing well   Coronary Artery Disease    No angina or CHF    Patient Profile: .     Amber Peterson is a 67 y.o. female former smoker with a CV History below, who presents here for annual f/u at the request of Benita Stabile, MD.  12/09/2022: CAD-Anterior STEMI (LAD PCI,- staged LCx-OM2; initial ICM), HTN, and HL & mild DM2 (A1c 6.6)   2 Vessel CAD: s/p DES PCI of LAD & LCx-OM2  Initial EF 30-35%, LAD distribution HK Echo: 03/24/21: (ICM Resolved) EF 55-60%. LV with regional wall motion abnormalities. Apical septal segments and apex are akinetic. Mid segments are hypokinetic. Mild concentric LVH. Grade 1 DD.   Amber Peterson was last seen on October 4 , 2023 as a routine follow-up but also preoperative evaluation.  In January 2023 we converted from Brilinta to Effient, stop spironolactone but continued Entresto and Toprol.  The main thing she noted was a little bit of decreased "get up and go.  Having to "wind herself up.  No bleeding but notable bruising.  Preop clearance for GI procedure provided.    Subjective   INTERVAL HPI Discussed the use of AI scribe software for clinical note transcription with the patient, who gave verbal consent to proceed.  History of Present Illness   The patient, with a history of heart disease and borderline diabetes, presents for a routine follow-up two years post-heart attack. They report occasional dizziness, particularly when standing up too quickly, and occasional shortness of breath, which they attribute to asthma and humidity. The patient denies any chest pain, pressure, or tightness. They are currently on medication for heart disease, including Entresto, metoprolol, and spironolactone, and have been  taking [place] for borderline diabetes for the past three months. The patient also takes atorvastatin for cholesterol management and Plavix as a maintenance dose post-stent placement. They report no issues with bleeding or other side effects from the Plavix. The patient's cholesterol levels were last checked in July and were within normal limits.     Cardiovascular ROS: no chest pain or dyspnea on exertion positive for - occasional orthostatic dizziness and exertional dyspnea related to asthma.  Worse with humidity. negative for - chest pain, edema, irregular heartbeat, orthopnea, palpitations, paroxysmal nocturnal dyspnea, rapid heart rate, or lightheadedness, dizziness or wooziness other than orthostasis.  Syncope/near syncope, TIA/CVA/amaurosis fugax or claudication.  ROS:  Review of Systems - Negative except mild left-sided dizziness noted above     Objective   Studies Reviewed: Marland Kitchen   EKG Interpretation Date/Time:  Wednesday January 13 2023 13:24:37 EDT Ventricular Rate:  64 PR Interval:  102 QRS Duration:  74 QT Interval:  418 QTC Calculation: 431 R Axis:   57  Text Interpretation: Sinus rhythm with sinus arrhythmia with short PR Low voltage QRS Nonspecific T wave abnormality When compared with ECG of 11-Dec-2020 06:41, Criteria for Anteroseptal infarct are no longer Present ST no longer elevated in Anterolateral leads Nonspecific T wave abnormality, worse in Inferior leads T wave inversion now evident in Anterior leads Confirmed by Bryan Lemma ([id number]) on 01/13/2023 1:32:33 PM    Results LABS Total cholesterol: 101 mg/dL (87/56/4332) LDL: 40 mg/dL (95/18/8416)  HDL: 41 mg/dL (40/98/1191) Triglycerides: 110 mg/dL (47/82/9562) ZHY8M: 5.7% (10/14/2022)  Cath-PCI & staged PCI (11/2020) Acute PCI of LAD (12/08/2020): Prox-mid LAD 100% (DES PCI) Prox RCA 30%. Short LM. Prox-mid LCx 80% (actually into OM2) & AVG LCVx 99%; Prox-Mid LAD 100%  Prox-Mid LAD 100% (DES PCI ONYX FRONTIER 2.5 x  26 mm -2.6 mm - see staged PCI diagram) 8/30: DES PCI Prox-Mid Cx 25%->OM2 90% -> DES PCI ONYX FRONTIER 2.0 x 15 -> 2.3 mm PTCA of AVG LCx (thrombotic 99%) -> 1.5 mm Balloon PTCA (through stent) - 20% residual      Risk Assessment/Calculations:         Physical Exam:   VS:  BP 99/65 (BP Location: Left Arm, Patient Position: Sitting, Cuff Size: Normal)   Pulse 64   Ht 4\' 10"  (1.473 m)   Wt 132 lb 9.6 oz (60.1 kg)   SpO2 96%   BMI 27.71 kg/m    Wt Readings from Last 3 Encounters:  01/13/23 132 lb 9.6 oz (60.1 kg)  01/14/22 140 lb (63.5 kg)  07/03/21 133 lb (60.3 kg)    GEN: Well nourished, well developed in no acute distress; Well groomed.  Healthy-appearing. NECK: No JVD; No carotid bruits CARDIAC: Normal S1, S2; RRR, no murmurs, rubs, gallops RESPIRATORY:  Clear to auscultation without rales, wheezing or rhonchi ; nonlabored, good air movement. ABDOMEN: Soft, non-tender, non-distended EXTREMITIES:  No edema; No deformity      ASSESSMENT AND PLAN: .    Problem List Items Addressed This Visit       Cardiology Problems   2 Vessel CAD: s/p DES PCI of LAD & LCx-OM2 - Primary (Chronic)    DES PCI in August 2022 with the acute MI being related to the LAD occlusion, then staged LCx-OM2 PCI.  Thankfully, no further angina.EKG normal. Pump function improved from 30-35% to 50-55%. LDL controlled at 40. On atorvastatin, metoprolol, Entresto, and Plavix. -Continue current medications. -Check cholesterol annually around the anniversary of the heart attack. -Refill nitroglycerin prescription.  Due to multiple stents, she is on maintenance SAPT-Plavix monotherapy. Okay to hold Plavix 5 to 7 days preop for surgeries or procedures. For significant bleeding or bruising okay to hold for 3 to 5 days.      Relevant Medications   spironolactone (ALDACTONE) 25 MG tablet   nitroGLYCERIN (NITROSTAT) 0.4 MG SL tablet   Atherosclerosis of aorta (HCC) (Chronic)    Seen on radiographic  imaging. Will be due for AAA Doppler evaluation prior to age 28      Relevant Medications   spironolactone (ALDACTONE) 25 MG tablet   nitroGLYCERIN (NITROSTAT) 0.4 MG SL tablet   Essential hypertension (Chronic)    Borderline hypotensive today.  No changes, but okay to hold spironolactone and/or Entresto if systolic pressures are less than 100 mmHg.      Relevant Medications   spironolactone (ALDACTONE) 25 MG tablet   nitroGLYCERIN (NITROSTAT) 0.4 MG SL tablet   Other Relevant Orders   EKG 12-Lead (Completed)   Hyperlipidemia with target low density lipoprotein (LDL) cholesterol less than 55 mg/dL (Chronic)    Most recent LDL was well within goal on current dose of atorvastatin 40 mg daily.  Tolerating well.      Relevant Medications   spironolactone (ALDACTONE) 25 MG tablet   nitroGLYCERIN (NITROSTAT) 0.4 MG SL tablet   Ischemic cardiomyopathy (Chronic)    Essentially resolved with EF bouncing back to normal range.  Remains on guideline directed therapy with low-dose Sherryll Burger  24-26 mg twice daily along with Toprol 25 mg daily at 12.5 mg spironolactone. NYHA Class 1-2A with some exertional dyspnea related to pulmonary issues. Does not require loop diuretic.  She has not used any of her Lasix since last year. PCP recently started Farxiga 10 mg daily.      Relevant Medications   spironolactone (ALDACTONE) 25 MG tablet   nitroGLYCERIN (NITROSTAT) 0.4 MG SL tablet   Orthostatic hypotension (Chronic)    Reports dizziness upon standing and walking too quickly. On spironolactone, metoprolol, and Entresto. -Shift spironolactone to lunchtime to avoid taking all medications at once. -Hold spironolactone if feeling particularly dizzy or woozy.      Relevant Medications   spironolactone (ALDACTONE) 25 MG tablet   nitroGLYCERIN (NITROSTAT) 0.4 MG SL tablet   ST elevation myocardial infarction (STEMI) of anterior wall, subsequent episode of care [facility]) (Chronic)    Now 2 years out from  anterior MI with two-vessel PCI.  He has notably improved on follow-up echocardiogram, but there is still apical akinesis that is related to the wraparound LAD. Is on optimal medical management that is limited by BP. My understanding of the immediate stop the spironolactone last visit, but she is still on it.      Relevant Medications   spironolactone (ALDACTONE) 25 MG tablet   nitroGLYCERIN (NITROSTAT) 0.4 MG SL tablet   Other Relevant Orders   EKG 12-Lead (Completed)     Other   DM (diabetes mellitus), type 2 with complications (HCC) (Chronic)    Recently diagnosed with diabetes-A1c 6.6 in July.  PCP started Farxiga 10 mg daily.  Both diabetes and cardiovascular benefit Also discussed dietary modification.      Presence of drug coated stent in LAD coronary artery (Chronic)    On Plavix monotherapy SAPT due to extensive PCI.  Okay to hold Plavix 5 to 7 days preop for surgeries or procedures. Okay to hold 3 to 5 days for significant bleeding or bruising.        -If any surgeries are needed within the next 6 months, contact the office.  -Dispo: Return in about 1 year (around 01/13/2024) for 1 Yr Follow-up, Routine follow up with me.  Total time spent: 20 min spent with patient + 17 min spent charting = 37 min      Signed, Marykay Lex, MD, MS Bryan Lemma, M.D., M.S. Interventional Cardiologist  [facility]  Pager # [phone] Phone # [phone] [address] [place], [place] [address]

## 2023-01-13 NOTE — Patient Instructions (Signed)
Medication Instructions:   Change Spironolactone to lunch *If you need a refill on your cardiac medications before your next appointment, please call your pharmacy*   Lab Work: Not needed    Testing/Procedures:  Not needed  Follow-Up: At [facility], you and your health needs are our priority.  As part of our continuing mission to provide you with exceptional heart care, we have created designated Provider Care Teams.  These Care Teams include your primary Cardiologist (physician) and Advanced Practice Providers (APPs -  Physician Assistants and Nurse Practitioners) who all work together to provide you with the care you need, when you need it.     Your next appointment:   12 month(s)  The format for your next appointment:   In Person  Provider:   Bryan Lemma, MD    Other Instructions

## 2023-01-18 ENCOUNTER — Encounter: Payer: Self-pay | Admitting: Cardiology

## 2023-01-18 DIAGNOSIS — I951 Orthostatic hypotension: Secondary | ICD-10-CM | POA: Insufficient documentation

## 2023-01-18 DIAGNOSIS — E118 Type 2 diabetes mellitus with unspecified complications: Secondary | ICD-10-CM | POA: Insufficient documentation

## 2023-01-18 NOTE — Assessment & Plan Note (Signed)
Recently diagnosed with diabetes-A1c 6.6 in July.  PCP started Farxiga 10 mg daily.  Both diabetes and cardiovascular benefit Also discussed dietary modification.

## 2023-01-18 NOTE — Assessment & Plan Note (Addendum)
Essentially resolved with EF bouncing back to normal range.  Remains on guideline directed therapy with low-dose Entresto 24-26 mg twice daily along with Toprol 25 mg daily at 12.5 mg spironolactone. NYHA Class 1-2A with some exertional dyspnea related to pulmonary issues. Does not require loop diuretic.  She has not used any of her Lasix since last year. PCP recently started Farxiga 10 mg daily.

## 2023-01-18 NOTE — Assessment & Plan Note (Addendum)
DES PCI in August 2022 with the acute MI being related to the LAD occlusion, then staged LCx-OM2 PCI.  Thankfully, no further angina.EKG normal. Pump function improved from 30-35% to 50-55%. LDL controlled at 40. On atorvastatin, metoprolol, Entresto, and Plavix. -Continue current medications. -Check cholesterol annually around the anniversary of the heart attack. -Refill nitroglycerin prescription.  Due to multiple stents, she is on maintenance SAPT-Plavix monotherapy. Okay to hold Plavix 5 to 7 days preop for surgeries or procedures. For significant bleeding or bruising okay to hold for 3 to 5 days.

## 2023-01-18 NOTE — Assessment & Plan Note (Signed)
Borderline hypotensive today.  No changes, but okay to hold spironolactone and/or Entresto if systolic pressures are less than 100 mmHg.

## 2023-01-18 NOTE — Assessment & Plan Note (Addendum)
Most recent LDL was well within goal on current dose of atorvastatin 40 mg daily.  Tolerating well.

## 2023-01-18 NOTE — Assessment & Plan Note (Signed)
Seen on radiographic imaging. Will be due for AAA Doppler evaluation prior to age 67

## 2023-01-18 NOTE — Assessment & Plan Note (Signed)
On Plavix monotherapy SAPT due to extensive PCI.  Okay to hold Plavix 5 to 7 days preop for surgeries or procedures. Okay to hold 3 to 5 days for significant bleeding or bruising.

## 2023-01-18 NOTE — Assessment & Plan Note (Signed)
Reports dizziness upon standing and walking too quickly. On spironolactone, metoprolol, and Entresto. -Shift spironolactone to lunchtime to avoid taking all medications at once. -Hold spironolactone if feeling particularly dizzy or woozy.

## 2023-01-18 NOTE — Assessment & Plan Note (Signed)
Now 2 years out from anterior MI with two-vessel PCI.  He has notably improved on follow-up echocardiogram, but there is still apical akinesis that is related to the wraparound LAD. Is on optimal medical management that is limited by BP. My understanding of the immediate stop the spironolactone last visit, but she is still on it.

## 2023-02-17 DIAGNOSIS — E1165 Type 2 diabetes mellitus with hyperglycemia: Secondary | ICD-10-CM | POA: Diagnosis not present

## 2023-02-17 DIAGNOSIS — E039 Hypothyroidism, unspecified: Secondary | ICD-10-CM | POA: Diagnosis not present

## 2023-02-17 DIAGNOSIS — E782 Mixed hyperlipidemia: Secondary | ICD-10-CM | POA: Diagnosis not present

## 2023-02-17 DIAGNOSIS — E559 Vitamin D deficiency, unspecified: Secondary | ICD-10-CM | POA: Diagnosis not present

## 2023-02-17 DIAGNOSIS — E538 Deficiency of other specified B group vitamins: Secondary | ICD-10-CM | POA: Diagnosis not present

## 2023-02-23 DIAGNOSIS — I5022 Chronic systolic (congestive) heart failure: Secondary | ICD-10-CM | POA: Diagnosis not present

## 2023-02-23 DIAGNOSIS — R809 Proteinuria, unspecified: Secondary | ICD-10-CM | POA: Diagnosis not present

## 2023-02-23 DIAGNOSIS — I739 Peripheral vascular disease, unspecified: Secondary | ICD-10-CM | POA: Diagnosis not present

## 2023-02-23 DIAGNOSIS — M5451 Vertebrogenic low back pain: Secondary | ICD-10-CM | POA: Diagnosis not present

## 2023-02-23 DIAGNOSIS — G894 Chronic pain syndrome: Secondary | ICD-10-CM | POA: Diagnosis not present

## 2023-02-23 DIAGNOSIS — I255 Ischemic cardiomyopathy: Secondary | ICD-10-CM | POA: Diagnosis not present

## 2023-02-23 DIAGNOSIS — N1831 Chronic kidney disease, stage 3a: Secondary | ICD-10-CM | POA: Diagnosis not present

## 2023-02-23 DIAGNOSIS — E1165 Type 2 diabetes mellitus with hyperglycemia: Secondary | ICD-10-CM | POA: Diagnosis not present

## 2023-02-23 DIAGNOSIS — J4520 Mild intermittent asthma, uncomplicated: Secondary | ICD-10-CM | POA: Diagnosis not present

## 2023-02-23 DIAGNOSIS — M79605 Pain in left leg: Secondary | ICD-10-CM | POA: Diagnosis not present

## 2023-02-23 DIAGNOSIS — E538 Deficiency of other specified B group vitamins: Secondary | ICD-10-CM | POA: Diagnosis not present

## 2023-02-23 DIAGNOSIS — I2510 Atherosclerotic heart disease of native coronary artery without angina pectoris: Secondary | ICD-10-CM | POA: Diagnosis not present

## 2023-03-23 ENCOUNTER — Other Ambulatory Visit: Payer: Self-pay | Admitting: Cardiology

## 2023-04-13 ENCOUNTER — Other Ambulatory Visit: Payer: Self-pay | Admitting: Cardiology

## 2023-04-15 MED ORDER — METOPROLOL SUCCINATE ER 25 MG PO TB24: 25.0000 mg | ORAL_TABLET | Freq: Every day | ORAL | 2 refills | Status: DC

## 2023-04-15 MED ORDER — SPIRONOLACTONE 25 MG PO TABS: 12.5000 mg | ORAL_TABLET | Freq: Every day | ORAL | 2 refills | Status: DC

## 2023-04-15 NOTE — Addendum Note (Signed)
 Addended by: Adriana Simas, Ira Busbin L on: 04/15/2023 07:53 AM   Modules accepted: Orders

## 2023-05-03 ENCOUNTER — Telehealth: Payer: Self-pay | Admitting: Cardiology

## 2023-05-03 NOTE — Telephone Encounter (Signed)
Pt c/o medication issue:  1. Name of Medication:   sacubitril-valsartan (ENTRESTO) 24-26 MG   2. How are you currently taking this medication (dosage and times per day)?   As prescribed  3. Are you having a reaction (difficulty breathing--STAT)?   4. What is your medication issue?   Patient stated this medication has now become too expensive for her.  Patient wants to get alternate medication.

## 2023-05-03 NOTE — Telephone Encounter (Signed)
Spoke with patient of Dr. Herbie Baltimore. She reports Entresto was $178/month to $338/month. She did not change anything with her Medicare Part D plan. She may have a drug deductible. She cannot call into her [facility] medicare. Advised to check with pharmacy about drug deductible and copay once deductible is paid down -- if they don't know, then call insurance.   Will send her message in MyChart w/[employer] PAP & info on medicare LIS/extra help

## 2023-06-04 ENCOUNTER — Other Ambulatory Visit: Payer: Self-pay | Admitting: Cardiology

## 2023-06-12 ENCOUNTER — Other Ambulatory Visit: Payer: Self-pay | Admitting: Cardiology

## 2023-06-28 DIAGNOSIS — E039 Hypothyroidism, unspecified: Secondary | ICD-10-CM | POA: Diagnosis not present

## 2023-06-28 DIAGNOSIS — E782 Mixed hyperlipidemia: Secondary | ICD-10-CM | POA: Diagnosis not present

## 2023-06-28 DIAGNOSIS — E538 Deficiency of other specified B group vitamins: Secondary | ICD-10-CM | POA: Diagnosis not present

## 2023-06-28 DIAGNOSIS — E1165 Type 2 diabetes mellitus with hyperglycemia: Secondary | ICD-10-CM | POA: Diagnosis not present

## 2023-07-02 ENCOUNTER — Encounter (HOSPITAL_COMMUNITY): Payer: Self-pay | Admitting: Family Medicine

## 2023-07-02 ENCOUNTER — Other Ambulatory Visit (HOSPITAL_COMMUNITY): Payer: Self-pay | Admitting: Family Medicine

## 2023-07-02 DIAGNOSIS — E538 Deficiency of other specified B group vitamins: Secondary | ICD-10-CM | POA: Diagnosis not present

## 2023-07-02 DIAGNOSIS — I739 Peripheral vascular disease, unspecified: Secondary | ICD-10-CM | POA: Diagnosis not present

## 2023-07-02 DIAGNOSIS — I5022 Chronic systolic (congestive) heart failure: Secondary | ICD-10-CM | POA: Diagnosis not present

## 2023-07-02 DIAGNOSIS — E782 Mixed hyperlipidemia: Secondary | ICD-10-CM | POA: Diagnosis not present

## 2023-07-02 DIAGNOSIS — M79605 Pain in left leg: Secondary | ICD-10-CM | POA: Diagnosis not present

## 2023-07-02 DIAGNOSIS — Z1231 Encounter for screening mammogram for malignant neoplasm of breast: Secondary | ICD-10-CM

## 2023-07-02 DIAGNOSIS — I2510 Atherosclerotic heart disease of native coronary artery without angina pectoris: Secondary | ICD-10-CM | POA: Diagnosis not present

## 2023-07-02 DIAGNOSIS — J4520 Mild intermittent asthma, uncomplicated: Secondary | ICD-10-CM | POA: Diagnosis not present

## 2023-07-02 DIAGNOSIS — E1165 Type 2 diabetes mellitus with hyperglycemia: Secondary | ICD-10-CM | POA: Diagnosis not present

## 2023-07-02 DIAGNOSIS — R809 Proteinuria, unspecified: Secondary | ICD-10-CM | POA: Diagnosis not present

## 2023-07-02 DIAGNOSIS — M5451 Vertebrogenic low back pain: Secondary | ICD-10-CM | POA: Diagnosis not present

## 2023-07-02 DIAGNOSIS — G894 Chronic pain syndrome: Secondary | ICD-10-CM | POA: Diagnosis not present

## 2023-07-02 DIAGNOSIS — E1151 Type 2 diabetes mellitus with diabetic peripheral angiopathy without gangrene: Secondary | ICD-10-CM | POA: Diagnosis not present

## 2023-07-19 ENCOUNTER — Ambulatory Visit (HOSPITAL_COMMUNITY)

## 2023-07-26 ENCOUNTER — Ambulatory Visit (HOSPITAL_COMMUNITY)
Admission: RE | Admit: 2023-07-26 | Discharge: 2023-07-26 | Disposition: A | Discharge status: Home / Self Care | Source: Ambulatory Visit | Attending: Family Medicine | Admitting: Family Medicine

## 2023-07-26 ENCOUNTER — Encounter (HOSPITAL_COMMUNITY): Payer: Self-pay

## 2023-07-26 DIAGNOSIS — Z1231 Encounter for screening mammogram for malignant neoplasm of breast: Secondary | ICD-10-CM | POA: Insufficient documentation

## 2023-08-04 ENCOUNTER — Other Ambulatory Visit (HOSPITAL_COMMUNITY): Payer: Self-pay | Admitting: Family Medicine

## 2023-08-04 DIAGNOSIS — R928 Other abnormal and inconclusive findings on diagnostic imaging of breast: Secondary | ICD-10-CM

## 2023-08-19 ENCOUNTER — Encounter (HOSPITAL_COMMUNITY): Payer: Self-pay

## 2023-08-26 ENCOUNTER — Ambulatory Visit (HOSPITAL_COMMUNITY)
Admission: RE | Admit: 2023-08-26 | Discharge: 2023-08-26 | Disposition: A | Discharge status: Home / Self Care | Source: Ambulatory Visit | Attending: Family Medicine | Admitting: Family Medicine

## 2023-08-26 DIAGNOSIS — R928 Other abnormal and inconclusive findings on diagnostic imaging of breast: Secondary | ICD-10-CM

## 2023-08-26 DIAGNOSIS — R92322 Mammographic fibroglandular density, left breast: Secondary | ICD-10-CM | POA: Diagnosis not present

## 2023-10-27 DIAGNOSIS — E039 Hypothyroidism, unspecified: Secondary | ICD-10-CM | POA: Diagnosis not present

## 2023-10-27 DIAGNOSIS — E1165 Type 2 diabetes mellitus with hyperglycemia: Secondary | ICD-10-CM | POA: Diagnosis not present

## 2023-10-27 DIAGNOSIS — E538 Deficiency of other specified B group vitamins: Secondary | ICD-10-CM | POA: Diagnosis not present

## 2023-10-27 DIAGNOSIS — E782 Mixed hyperlipidemia: Secondary | ICD-10-CM | POA: Diagnosis not present

## 2023-11-02 DIAGNOSIS — M5451 Vertebrogenic low back pain: Secondary | ICD-10-CM | POA: Diagnosis not present

## 2023-11-02 DIAGNOSIS — E538 Deficiency of other specified B group vitamins: Secondary | ICD-10-CM | POA: Diagnosis not present

## 2023-11-02 DIAGNOSIS — J4520 Mild intermittent asthma, uncomplicated: Secondary | ICD-10-CM | POA: Diagnosis not present

## 2023-11-02 DIAGNOSIS — N1831 Chronic kidney disease, stage 3a: Secondary | ICD-10-CM | POA: Diagnosis not present

## 2023-11-02 DIAGNOSIS — G894 Chronic pain syndrome: Secondary | ICD-10-CM | POA: Diagnosis not present

## 2023-11-02 DIAGNOSIS — I2510 Atherosclerotic heart disease of native coronary artery without angina pectoris: Secondary | ICD-10-CM | POA: Diagnosis not present

## 2023-11-02 DIAGNOSIS — R809 Proteinuria, unspecified: Secondary | ICD-10-CM | POA: Diagnosis not present

## 2023-11-02 DIAGNOSIS — E782 Mixed hyperlipidemia: Secondary | ICD-10-CM | POA: Diagnosis not present

## 2023-11-02 DIAGNOSIS — M79605 Pain in left leg: Secondary | ICD-10-CM | POA: Diagnosis not present

## 2023-11-02 DIAGNOSIS — I5022 Chronic systolic (congestive) heart failure: Secondary | ICD-10-CM | POA: Diagnosis not present

## 2023-11-02 DIAGNOSIS — I739 Peripheral vascular disease, unspecified: Secondary | ICD-10-CM | POA: Diagnosis not present

## 2023-11-02 DIAGNOSIS — E1165 Type 2 diabetes mellitus with hyperglycemia: Secondary | ICD-10-CM | POA: Diagnosis not present

## 2024-01-13 DIAGNOSIS — R252 Cramp and spasm: Secondary | ICD-10-CM | POA: Diagnosis not present

## 2024-01-13 DIAGNOSIS — G894 Chronic pain syndrome: Secondary | ICD-10-CM | POA: Diagnosis not present

## 2024-03-04 ENCOUNTER — Other Ambulatory Visit: Payer: Self-pay | Admitting: Cardiology

## 2024-03-17 ENCOUNTER — Other Ambulatory Visit: Payer: Self-pay | Admitting: Cardiology

## 2024-03-20 ENCOUNTER — Ambulatory Visit: Admitting: Physician Assistant

## 2024-03-21 ENCOUNTER — Telehealth: Payer: Self-pay | Admitting: Cardiology

## 2024-03-21 MED ORDER — ATORVASTATIN CALCIUM 40 MG PO TABS: 40.0000 mg | ORAL_TABLET | Freq: Every day | ORAL | 0 refills | Status: DC

## 2024-03-21 MED ORDER — CLOPIDOGREL BISULFATE 75 MG PO TABS: 75.0000 mg | ORAL_TABLET | Freq: Every day | ORAL | 0 refills | Status: DC

## 2024-03-21 NOTE — Telephone Encounter (Signed)
 Pt scheduled to see Katlyn West, NP, 04/12/24.  Refill sent.

## 2024-03-21 NOTE — Telephone Encounter (Signed)
*  STAT* If patient is at the pharmacy, call can be transferred to refill team.   1. Which medications need to be refilled? (please list name of each medication and dose if known)   clopidogrel  (PLAVIX ) 75 MG tablet    atorvastatin  (LIPITOR ) 40 MG tablet   2. Would you like to learn more about the convenience, safety, & potential cost savings by using the [facility]? No    3. Are you open to using the [facility] (Type [facility]. no   4. Which pharmacy/location (including street and city if local pharmacy) is medication to be sent to?  [id number] - [place], [place] - [address] AT [address]     5. Do they need a 30 day or 90 day supply? 30 days    Appt 12/23 at 1:55

## 2024-04-08 ENCOUNTER — Other Ambulatory Visit: Payer: Self-pay | Admitting: Cardiology

## 2024-04-10 ENCOUNTER — Other Ambulatory Visit: Payer: Self-pay | Admitting: Cardiology

## 2024-04-10 NOTE — Progress Notes (Unsigned)
 "  Cardiology Office Note   Date:  04/12/2024  ID:  Amber Peterson, Amber Peterson 11-04-1955, MRN [id number] PCP:  Shona Norleen PEDLAR, MD  Cardiologist:  Alm Clay, MD  Electrophysiologist:  None   Chief Complaint: Follow up for CAD  History of Present Illness: .   Amber Peterson is a 68 y.o. female with visit-pertinent history of hepatitis C, hypertension, hyperlipidemia, CAD and ischemic cardiomyopathy.  In 11/2020 patient presented to with chest pain, EKG revealed anterior STEMI. Initial cardiac catheterization performed on 11/30/2020 revealed 100% proximal to mid LAD occlusion treated with Onyx frontier 2.5 x 26 mm DES, 80% proximal to mid left circumflex lesion, 99% mid left circumflex lesion, 30% proximal RCA lesion.  Echocardiogram obtained on 12/09/2020 showed EF 30 to 35%, wall motion abnormality with mid to apical anterior, anteroseptal, and inferoseptal akinesis, apical inferior akinesis, apical lateral akinesis, akinesis of the true apex, mild LVH. She returned to the Cath Lab on 12/10/2020 and underwent DES to 99% mid left circumflex lesion.  Postprocedure, patient was placed on aspirin  and Brilinta .  Prior to discharge, she was placed on LifeVest.   Echocardiogram in 03/2021 indicated EF 55 to 60%, LV with regional wall motion abnormalities, apical septal segment and apex were akinetic.  Mid segments were hypokinetic.  Mild concentric LVH, G1 DD.  Patient was seen by Dr. Clay in 01/2022.  Spironolactone  had previously been stopped was continued on Entresto  and Toprol .  She was last in the clinic by Dr. Clay on 01/13/2023 she reported occasional dizziness particular when standing up too quickly and occasional shortness of breath which they attributed to asthma and humidity.  She denied any chest pain, pressure or tightness.  Today she presents for follow up. She reports that she has been doing well overall.  Patient reports that she went on a cruise in November and since then has not been as active  as she was previously.  She notes some dietary indiscretion with the recent holidays, notes 1 day she had increased intake of soda and had some chest discomfort related to indigestion and resolved with taking of Tums, denies any further episodes.  She notes some mild shortness of breath while doing higher intensity chores, she notes that this is likely related to her history of asthma.  She denies any palpitations, increased lower extremity edema, orthopnea or PND.  She denies any presyncope or syncope.  Patient notes that in the new year she is planning to resume her chair exercises which she was doing prior to her cruise and tolerated well. ROS: .   Today she denies lower extremity edema, fatigue, palpitations, melena, hematuria, hemoptysis, diaphoresis, weakness, presyncope, syncope, orthopnea, and PND.  All other systems are reviewed and otherwise negative. Studies Reviewed: SABRA   EKG:  EKG is ordered today, personally reviewed, demonstrating  EKG Interpretation Date/Time:  Wednesday April 12 2024 13:50:46 EST Ventricular Rate:  66 PR Interval:  102 QRS Duration:  78 QT Interval:  408 QTC Calculation: 427 R Axis:   66  Text Interpretation: Sinus rhythm with short PR Low voltage QRS Nonspecific T wave abnormality When compared with ECG of 13-Jan-2023 13:24, No significant change was found Confirmed by Keylie Beavers [phone]) on 04/12/2024 2:19:47 PM   CV Studies: Cardiac studies reviewed are outlined and summarized above. Otherwise please see EMR for full report. Cardiac Studies & Procedures   ______________________________________________________________________________________________ CARDIAC CATHETERIZATION  CARDIAC CATHETERIZATION 12/10/2020  Conclusion   Ost Cx lesion is 30% stenosed.   Prox  LAD lesion is 50% stenosed.   2nd Mrg lesion is 90% stenosed.   Mid Cx lesion is 99% stenosed.   Prox Cx to Mid Cx lesion is 25% stenosed.   Previously placed Prox LAD to Mid LAD stent (unknown  type) is  widely patent.   A drug-eluting stent was successfully placed.   A stent was successfully placed.   Post intervention, there is a 0% residual stenosis.   Post intervention, there is a 20% residual stenosis.   Post intervention, there is a 0% residual stenosis.  Short left main coronary artery which bifurcated into the LAD and left circumflex.  There is smooth 50% narrowing in the LAD proximal to the septal perforating artery and the previously placed LAD stent.  The LAD stent is widely patent.  There was initial ostial narrowing of the circumflex with ST elevation with initial catheter engagement resulting in need for XB 3.0 guide catheter with sideholes.  The circumflex gave rise to a large second marginal vessel with 90% stenosis.  The small AV groove circumflex was 99% stenosed after marginal vessel takeoff.  Successful PTCA stenting of the circumflex into the OM 2 vessel with ultimate insertion of a 2.0 x 15 mm Onyx frontier stent postdilated to 2.3 mm.  PTCA of the subtotal AV groove circumflex with a 1.5 x 12 mm balloon.  There was thrombus formation in the AV vessel which had increased after stenting into the marginal vessel.  The AV groove circumflex was rewired and successfully re-dilated.   Patient was started on Aggrastat  bolus plus infusion with residual thrombus;  there was no evidence for dissection.  LVEDP prior to intervention was 13 mmHg.  RECOMMENDATION: DAPT for minimum of 1 year but probably longer.  Continue Aggrastat  infusion 18 hours post procedure.  The patient will be maintained on low-dose IV nitroglycerin .  Aggressive lipid-lowering therapy abdominal blood pressure control.  Findings Coronary Findings Diagnostic  Dominance: Right  Left Anterior Descending Prox LAD lesion is 50% stenosed. Previously placed Prox LAD to Mid LAD stent (unknown type) is  widely patent.  Left Circumflex Vessel is small. Ost Cx lesion is 30% stenosed. Prox Cx to Mid Cx  lesion is 25% stenosed. Mid Cx lesion is 99% stenosed.  First Obtuse Marginal Branch Vessel is small in size.  Second Obtuse Marginal Branch 2nd Mrg lesion is 90% stenosed.  Intervention  Prox Cx to Mid Cx lesion Stent A stent was successfully placed. Post-Intervention Lesion Assessment The intervention was successful. Pre-interventional TIMI flow is 3. Post-intervention TIMI flow is 3. There is a 0% residual stenosis post intervention.  Mid Cx lesion Post-Intervention Lesion Assessment The intervention was successful. Pre-interventional TIMI flow is 3. Post-intervention TIMI flow is 3. At this lesion, a thrombus occurred. There is a 20% residual stenosis post intervention.  2nd Mrg lesion Stent Pre-stent angioplasty was performed. A drug-eluting stent was successfully placed. Stent strut is well apposed. Post-Intervention Lesion Assessment The intervention was successful. Pre-interventional TIMI flow is 3. Post-intervention TIMI flow is 3. No complications occurred at this lesion. There is a 0% residual stenosis post intervention.   CARDIAC CATHETERIZATION 12/08/2020  Conclusion Images from the original result were not included.    Prox RCA lesion is 30% stenosed.   Prox LAD to Mid LAD lesion is 100% stenosed.   Prox Cx to Mid Cx lesion is 80% stenosed.   Mid Cx lesion is 99% stenosed.   A drug-eluting stent was successfully placed using a STENT ONYX FRONTIER 2.5X26.  Post intervention, there is a 0% residual stenosis.  Amber Peterson is a 68 y.o. female   [id number] LOCATION:  FACILITY: [facility] PHYSICIAN: Dorn Lesches, M.D. Aug 31, 1955   DATE OF PROCEDURE:  12/08/2020  DATE OF DISCHARGE:     CARDIAC CATHETERIZATION / PCI DES mid LAD    History obtained from chart review.  68 year old single Caucasian female mother of 1 child who lives in [place] without prior cardiac history.  She does have a history of treated hypertension.  She was awakened at approximately  330 this morning with chest pain and back pain rating to her shoulder.  She went to [facility] emergency room where an EKG showed anterior ST segment elevation with Q waves in her anterior precordial leads.  She was transferred at approximately 11:00 (approximately 8 hours after symptom onset) for urgent catheterization and potential intervention.   PROCEDURE DESCRIPTION:  The patient was brought to the second floor [place] [facility]iac cath lab in the postabsorptive state.  She was premedicated with IV Benadryl  and Solu-Medrol  for contrast allergy prophylaxis.  She also received IV fentanyl .  Her right wrist was prepped and shaved in usual sterile fashion. Xylocaine  1% was used for local anesthesia. A 6 French sheath was inserted into the right radial artery using standard Seldinger technique. The patient received 3500 units  of heparin  intravenously.  A 5 French TIG catheter and pigtail catheter used for selective coronary angiography and obtain left heart pressures.  Isovue dye was used for the entirety of the case (130 cc total administered to patient).  Retrograde aortic, ventricular and pullback pressures were recorded.  Radial cocktail was administered via the SideArm sheath.  Patient was seen for total [place] units of heparin  with an ACT of 387.  Isovue dye is used for the entirety of the intervention.  Retroaortic pressures monitored during the case.  She was hypertensive throughout the case.  180 mg of p.o. Brilinta  was administered to the patient p.o. prior to intervention.  Using a 6 French XB LAD 3.0 cm guide cath along with 0.14 [place] guidewire and a 2 mm x 12 m balloon the wire easily crossed the lesion providing antegrade flow with a door to balloon time of 37 minutes.  I then carefully positioned a 2.5 mm x 26 mm long Medtronic frontier drug-eluting stent and deployed at 14 atm.  I postdilated the entire stented segment with a 2.5 mm x 15 mm long noncompliant balloon at 18 atm  (2.6 mm) resulting reduction of a total occlusion to 0% residual TIMI-3 flow.  I did give 200 mcg of intracoronary glycerin.  There was pruning of the distal vessel.  There was an 80% AV groove circumflex stenosis just before the takeoff of a large marginal branch with a 99% continuation of the AV groove circumflex.  Impression Ms. Beaulah had an occluded LAD with a door to balloon time of 37 minutes and most likely a completed anterior STEMI although she was having residual back pain.  She did have Q waves in the anterior leads and restoration of blood flow occurred approximately 8 hours after symptom onset.  I began her on low-dose IV nitroglycerin  for blood pressure control.  She will need guideline directed optimal medical therapy with beta-blockade plus or minus ACE/ARB depending on her LV function which we determined by 2D echocardiogram.  She has residual circumflex disease which will be intervened on in a staged fashion.  The sheath was removed and a TR band was placed  on the right wrist to achieve patent hemostasis.  The patient left the lab in stable condition.  Dorn Lesches. MD, [facility] 12/08/2020 12:50 PM  Findings Coronary Findings Diagnostic  Dominance: Right  Left Anterior Descending Prox LAD to Mid LAD lesion is 100% stenosed. Vessel is the culprit lesion. The lesion was not previously treated .  Left Circumflex Prox Cx to Mid Cx lesion is 80% stenosed. Mid Cx lesion is 99% stenosed.  Right Coronary Artery Prox RCA lesion is 30% stenosed.  Intervention  Prox LAD to Mid LAD lesion Stent Lesion crossed with guidewire. Pre-stent angioplasty was performed. A drug-eluting stent was successfully placed using a STENT ONYX FRONTIER 2.5X26. Stent strut is well apposed. Stent does not overlap previously placed stentPost-stent angioplasty was performed. Post-Intervention Lesion Assessment The intervention was successful. Pre-interventional TIMI flow is 0. Post-intervention TIMI flow  is 3. No complications occurred at this lesion. There is a 0% residual stenosis post intervention.     ECHOCARDIOGRAM  ECHOCARDIOGRAM COMPLETE 03/24/2021  Narrative ECHOCARDIOGRAM REPORT    Patient Name:   Amber Peterson [facility] Date of Exam: 03/24/2021 Medical Rec #:  [id number]      Height:       58.0 in Accession #:    [id number]     Weight:       137.6 lb Date of Birth:  1956-02-24      BSA:          1.553 m Patient Age:    65 years       BP:           104/78 mmHg Patient Gender: F              HR:           70 bpm. Exam Location:  [address]  Procedure: 2D Echo, Cardiac Doppler, Color Doppler and Intracardiac Opacification Agent  Indications:    I25.5 Ischemic cardiomyopathy  History:        Patient has prior history of Echocardiogram examinations, most recent 12/09/2020. Cardiomyopathy, CAD and Previous Myocardial Infarction; Risk Factors:Hypertension and Dyslipidemia. Hepatitis C.  Sonographer:    Marshia Lawyer BS, RDCS Referring Phys: [phone] HAO MENG   Sonographer Comments: Technically difficult study due to poor echo windows, suboptimal parasternal window and suboptimal apical window. IMPRESSIONS   1. Left ventricular ejection fraction, by estimation, is 55 to 60%. The left ventricle has normal function. The left ventricle demonstrates regional wall motion abnormalities. The apical septal segments and apex are akinetic. The mid septal segments are hypokinetic. There is mild concentric left ventricular hypertrophy. Left ventricular diastolic parameters are consistent with Grade I diastolic dysfunction (impaired relaxation). 2. Right ventricular systolic function is normal. The right ventricular size is normal. 3. The mitral valve is normal in structure. Trivial mitral valve regurgitation. 4. The aortic valve is tricuspid. There is mild calcification of the aortic valve. There is mild thickening of the aortic valve. Aortic valve regurgitation is not visualized. Aortic valve  sclerosis/calcification is present, without any evidence of aortic stenosis. 5. The inferior vena cava is normal in size with greater than 50% respiratory variability, suggesting right atrial pressure of 3 mmHg.  Comparison(s): Compared to prior TTE on 12/09/20, the LVEF has significantly improved to 55-60% (previously 30-35%) with improved wall motion.  FINDINGS Left Ventricle: Left ventricular ejection fraction, by estimation, is 55 to 60%. The left ventricle has normal function. The left ventricle demonstrates regional wall motion abnormalities. The apex and apical septal segments are akinetic.  The mid septal segments are hypokinetic. Definity  contrast agent was given IV to delineate the left ventricular endocardial borders. The left ventricular internal cavity size was normal in size. There is mild concentric left ventricular hypertrophy. Left ventricular diastolic parameters are consistent with Grade I diastolic dysfunction (impaired relaxation).  Right Ventricle: The right ventricular size is normal. No increase in right ventricular wall thickness. Right ventricular systolic function is normal.  Left Atrium: Left atrial size was normal in size.  Right Atrium: Right atrial size was normal in size.  Pericardium: There is no evidence of pericardial effusion.  Mitral Valve: The mitral valve is normal in structure. There is mild thickening of the mitral valve leaflet(s). There is mild calcification of the mitral valve leaflet(s). Mild mitral annular calcification. Trivial mitral valve regurgitation.  Tricuspid Valve: The tricuspid valve is normal in structure. Tricuspid valve regurgitation is trivial.  Aortic Valve: The aortic valve is tricuspid. There is mild calcification of the aortic valve. There is mild thickening of the aortic valve. Aortic valve regurgitation is not visualized. Aortic valve sclerosis/calcification is present, without any evidence of aortic stenosis.  Pulmonic Valve:  The pulmonic valve was normal in structure. Pulmonic valve regurgitation is trivial.  Aorta: The aortic root is normal in size and structure.  Venous: The inferior vena cava is normal in size with greater than 50% respiratory variability, suggesting right atrial pressure of 3 mmHg.  IAS/Shunts: No atrial level shunt detected by color flow Doppler.   LEFT VENTRICLE PLAX 2D LVIDd:         4.00 cm   Diastology LVIDs:         2.80 cm   LV e' medial:    8.16 cm/s LV PW:         0.80 cm   LV E/e' medial:  12.1 LV IVS:        0.80 cm   LV e' lateral:   8.05 cm/s LVOT diam:     1.90 cm   LV E/e' lateral: 12.2 LV SV:         64 LV SV Index:   41 LVOT Area:     2.84 cm   RIGHT VENTRICLE             IVC RV Basal diam:  2.70 cm     IVC diam: 1.62 cm RV S prime:     15.80 cm/s TAPSE (M-mode): 1.9 cm  LEFT ATRIUM             Index        RIGHT ATRIUM           Index LA diam:        3.50 cm 2.25 cm/m   RA Pressure: 3.00 mmHg LA Vol (A2C):   22.4 ml 14.42 ml/m  RA Area:     8.92 cm LA Vol (A4C):   18.6 ml 11.97 ml/m  RA Volume:   16.60 ml  10.69 ml/m LA Biplane Vol: 20.7 ml 13.33 ml/m AORTIC VALVE LVOT Vmax:   111.00 cm/s LVOT Vmean:  71.800 cm/s LVOT VTI:    0.224 m  AORTA Ao Root diam: 2.70 cm Ao Asc diam:  3.30 cm  MITRAL VALVE               TRICUSPID VALVE Estimated RAP:  3.00 mmHg MV Decel Time: 215 msec MV E velocity: 98.50 cm/s  SHUNTS MV A velocity: 88.00 cm/s  Systemic VTI:  0.22 m MV E/A ratio:  1.12  Systemic Diam: 1.90 cm  Powell Sorrow MD Electronically signed by Powell Sorrow MD Signature Date/Time: 03/24/2021/11:59:18 AM    Final          ______________________________________________________________________________________________       Current Reported Medications:.    Active Medications[1]  Physical Exam:   VS:  BP 110/78   Pulse 66   Ht 4' 10 (1.473 m)   Wt 126 lb 9.6 oz (57.4 kg)   SpO2 98%   BMI 26.46 kg/m    Wt  Readings from Last 3 Encounters:  04/12/24 126 lb 9.6 oz (57.4 kg)  01/13/23 132 lb 9.6 oz (60.1 kg)  01/14/22 140 lb (63.5 kg)    GEN: Well nourished, well developed in no acute distress NECK: No JVD; No carotid bruits CARDIAC: RRR, no murmurs, rubs, gallops RESPIRATORY:  Clear to auscultation without rales, wheezing or rhonchi  ABDOMEN: Soft, non-tender, non-distended EXTREMITIES:  No edema; No acute deformity     Asessement and Plan:.    CAD: Patient with anterior STEMI in 2022 s/p DES PCI followed by staged LCx/OM2 PCI LVEF improved from 30 to 35% to 50 to 55%. Stable with no anginal symptoms. No indication for ischemic evaluation.  Heart healthy diet and regular cardiovascular exercise encouraged.  Reviewed ED precautions. Continue Plavix  75 mg daily, Lipitor  40 mg daily, Lasix  20 mg daily, Entresto  24-26 mg twice daily and spironolactone  12.5 mg daily. Patient has been continued on Plavix  monotherapy.  Ischemic cardiomyopathy: LVEF 30 to 35% at time of STEMI in 2022, recovered to 50 to 55%. Today she reports stable dyspnea on exertion, likely related to history of asthma.  She denies any increased lower extremity edema, orthopnea or PND.  Continue Lasix , Entresto , Toprol , Entresto  and spironolactone .  She appears euvolemic and well compensated on exam.  HLD: Last lipid profile indicated total cholesterol 120, HDL 50, triglycerides 140 and LDL 46.  Continue Lipitor  40 mg daily.  HTN: Blood pressure today 110/78.  Continue current antihypertensive regimen.  DM type 2: Last hemoglobin A1C 5.8%.  Monitored and managed per PCP.   Disposition: F/u with Dr. Anner or Luismario Coston, NP in one year or sooner if needed.   Signed, Ashtin Melichar D Hartlee Amedee, NP       [1]  Current Meds  Medication Sig   acetaminophen  (TYLENOL ) 500 MG tablet Take 2,000 mg by mouth as needed for moderate pain.   albuterol  (VENTOLIN  HFA) 108 (90 Base) MCG/ACT inhaler Inhale 2 puffs into the lungs every 6 (six) hours as  needed for wheezing or shortness of breath.   alendronate (FOSAMAX) 70 MG tablet Take 70 mg by mouth once a week.   ALPRAZolam  (XANAX ) 0.5 MG tablet Take 0.5 mg by mouth 2 (two) times daily.   atorvastatin  (LIPITOR ) 40 MG tablet Take 1 tablet (40 mg total) by mouth daily.   clopidogrel  (PLAVIX ) 75 MG tablet Take 1 tablet (75 mg total) by mouth daily. Do not start until you finish taking Effient    ENTRESTO  24-26 MG TAKE 1 TABLET BY MOUTH TWICE A DAY   furosemide  (LASIX ) 20 MG tablet Take 2 tablets (40 mg) for 3 days, then take 1 tablet (20 mg) daily.   HYDROcodone-acetaminophen  (NORCO/VICODIN) 5-325 MG tablet Take 1 tablet by mouth every 8 (eight) hours.   hydrocortisone cream 1 % Apply 1 application  topically daily as needed for itching.   levothyroxine  (SYNTHROID ) 25 MCG tablet Take 25 mcg by mouth daily.   metoprolol  succinate (TOPROL -XL) 25 MG 24 hr  tablet Take 1 tablet (25 mg total) by mouth daily with supper.   nitroGLYCERIN  (NITROSTAT ) 0.4 MG SL tablet Place 1 tablet (0.4 mg total) under the tongue every 5 (five) minutes as needed for chest pain.   spironolactone  (ALDACTONE ) 25 MG tablet Take 0.5 tablets (12.5 mg total) by mouth daily.   Tetrahydrozoline HCl (VISINE OP) Place 1 drop into both eyes daily as needed (dry eyes).   traZODone  (DESYREL ) 50 MG tablet Take 150 mg by mouth at bedtime.   venlafaxine  XR (EFFEXOR -XR) 150 MG 24 hr capsule Take 150 mg by mouth daily with breakfast.   "

## 2024-04-11 NOTE — Telephone Encounter (Signed)
 Pt of Dr. Anner. Please address this Pharmacy request.

## 2024-04-12 ENCOUNTER — Ambulatory Visit: Admitting: Cardiology

## 2024-04-12 ENCOUNTER — Encounter: Payer: Self-pay | Admitting: Cardiology

## 2024-04-12 VITALS — BP 110/78 | HR 66 | Ht <= 58 in | Wt 126.6 lb

## 2024-04-12 DIAGNOSIS — I2510 Atherosclerotic heart disease of native coronary artery without angina pectoris: Secondary | ICD-10-CM | POA: Diagnosis not present

## 2024-04-12 DIAGNOSIS — I10 Essential (primary) hypertension: Secondary | ICD-10-CM

## 2024-04-12 DIAGNOSIS — E785 Hyperlipidemia, unspecified: Secondary | ICD-10-CM | POA: Diagnosis not present

## 2024-04-12 DIAGNOSIS — I255 Ischemic cardiomyopathy: Secondary | ICD-10-CM | POA: Diagnosis not present

## 2024-04-12 NOTE — Patient Instructions (Signed)
 Medication Instructions:  Your physician recommends that you continue on your current medications as directed. Please refer to the Current Medication list given to you today.  *If you need a refill on your cardiac medications before your next appointment, please call your pharmacy*  Lab Work: NONE If you have labs (blood work) drawn today and your tests are completely normal, you will receive your results only by: MyChart Message (if you have MyChart) OR A paper copy in the mail If you have any lab test that is abnormal or we need to change your treatment, we will call you to review the results.  Testing/Procedures: NONE  Follow-Up: At [facility], you and your health needs are our priority.  As part of our continuing mission to provide you with exceptional heart care, our providers are all part of one team.  This team includes your primary Cardiologist (physician) and Advanced Practice Providers or APPs (Physician Assistants and Nurse Practitioners) who all work together to provide you with the care you need, when you need it.  Your next appointment:   1 year(s)  Provider:   Alm Clay, MD or Katlyn West, NP

## 2024-04-18 MED ORDER — SACUBITRIL-VALSARTAN 24-26 MG PO TABS: 1.0000 | ORAL_TABLET | Freq: Two times a day (BID) | ORAL | 3 refills | Status: DC

## 2024-04-18 NOTE — Telephone Encounter (Signed)
"   Irbesartan 75 mg - was refused . Patient is taking generic Entresto   24/26 mg twice  a day.    A new prescription was sent in form generic Entresto  24/26 mg  twice a day 90 day supply  with 3 refills. "

## 2024-05-11 ENCOUNTER — Emergency Department (HOSPITAL_COMMUNITY)

## 2024-05-11 ENCOUNTER — Emergency Department (HOSPITAL_COMMUNITY): Admitting: Registered Nurse

## 2024-05-11 ENCOUNTER — Inpatient Hospital Stay (HOSPITAL_COMMUNITY)
Admission: EM | Admit: 2024-05-11 | Discharge: 2024-05-14 | DRG: 062 | Disposition: A | Attending: Neurology | Admitting: Neurology

## 2024-05-11 ENCOUNTER — Inpatient Hospital Stay (HOSPITAL_COMMUNITY)

## 2024-05-11 ENCOUNTER — Encounter (HOSPITAL_COMMUNITY): Admission: EM | Payer: Self-pay | Source: Home / Self Care | Attending: Neurology

## 2024-05-11 DIAGNOSIS — Z8249 Family history of ischemic heart disease and other diseases of the circulatory system: Secondary | ICD-10-CM

## 2024-05-11 DIAGNOSIS — Z7983 Long term (current) use of bisphosphonates: Secondary | ICD-10-CM

## 2024-05-11 DIAGNOSIS — Z955 Presence of coronary angioplasty implant and graft: Secondary | ICD-10-CM

## 2024-05-11 DIAGNOSIS — Z87891 Personal history of nicotine dependence: Secondary | ICD-10-CM

## 2024-05-11 DIAGNOSIS — R29810 Facial weakness: Secondary | ICD-10-CM | POA: Diagnosis present

## 2024-05-11 DIAGNOSIS — I10 Essential (primary) hypertension: Secondary | ICD-10-CM | POA: Diagnosis not present

## 2024-05-11 DIAGNOSIS — R471 Dysarthria and anarthria: Secondary | ICD-10-CM | POA: Diagnosis present

## 2024-05-11 DIAGNOSIS — E785 Hyperlipidemia, unspecified: Secondary | ICD-10-CM | POA: Diagnosis not present

## 2024-05-11 DIAGNOSIS — Z96611 Presence of right artificial shoulder joint: Secondary | ICD-10-CM | POA: Diagnosis present

## 2024-05-11 DIAGNOSIS — I5032 Chronic diastolic (congestive) heart failure: Secondary | ICD-10-CM | POA: Diagnosis present

## 2024-05-11 DIAGNOSIS — I110 Hypertensive heart disease with heart failure: Secondary | ICD-10-CM | POA: Diagnosis present

## 2024-05-11 DIAGNOSIS — I63311 Cerebral infarction due to thrombosis of right middle cerebral artery: Principal | ICD-10-CM | POA: Diagnosis present

## 2024-05-11 DIAGNOSIS — Z7902 Long term (current) use of antithrombotics/antiplatelets: Secondary | ICD-10-CM

## 2024-05-11 DIAGNOSIS — W19XXXA Unspecified fall, initial encounter: Secondary | ICD-10-CM | POA: Diagnosis present

## 2024-05-11 DIAGNOSIS — Y92000 Kitchen of unspecified non-institutional (private) residence as  the place of occurrence of the external cause: Secondary | ICD-10-CM

## 2024-05-11 DIAGNOSIS — F32A Depression, unspecified: Secondary | ICD-10-CM | POA: Diagnosis present

## 2024-05-11 DIAGNOSIS — Z818 Family history of other mental and behavioral disorders: Secondary | ICD-10-CM

## 2024-05-11 DIAGNOSIS — I255 Ischemic cardiomyopathy: Secondary | ICD-10-CM | POA: Diagnosis not present

## 2024-05-11 DIAGNOSIS — I63411 Cerebral infarction due to embolism of right middle cerebral artery: Secondary | ICD-10-CM | POA: Diagnosis not present

## 2024-05-11 DIAGNOSIS — Z83438 Family history of other disorder of lipoprotein metabolism and other lipidemia: Secondary | ICD-10-CM

## 2024-05-11 DIAGNOSIS — I63511 Cerebral infarction due to unspecified occlusion or stenosis of right middle cerebral artery: Secondary | ICD-10-CM | POA: Diagnosis present

## 2024-05-11 DIAGNOSIS — Z823 Family history of stroke: Secondary | ICD-10-CM

## 2024-05-11 DIAGNOSIS — I2510 Atherosclerotic heart disease of native coronary artery without angina pectoris: Secondary | ICD-10-CM | POA: Diagnosis present

## 2024-05-11 DIAGNOSIS — R29707 NIHSS score 7: Secondary | ICD-10-CM | POA: Diagnosis not present

## 2024-05-11 DIAGNOSIS — E119 Type 2 diabetes mellitus without complications: Secondary | ICD-10-CM | POA: Diagnosis present

## 2024-05-11 DIAGNOSIS — F411 Generalized anxiety disorder: Secondary | ICD-10-CM | POA: Diagnosis present

## 2024-05-11 DIAGNOSIS — R29701 NIHSS score 1: Secondary | ICD-10-CM | POA: Diagnosis present

## 2024-05-11 DIAGNOSIS — J45909 Unspecified asthma, uncomplicated: Secondary | ICD-10-CM | POA: Diagnosis present

## 2024-05-11 DIAGNOSIS — I639 Cerebral infarction, unspecified: Principal | ICD-10-CM

## 2024-05-11 DIAGNOSIS — Z79899 Other long term (current) drug therapy: Secondary | ICD-10-CM

## 2024-05-11 DIAGNOSIS — I252 Old myocardial infarction: Secondary | ICD-10-CM

## 2024-05-11 DIAGNOSIS — Z90710 Acquired absence of both cervix and uterus: Secondary | ICD-10-CM

## 2024-05-11 DIAGNOSIS — R233 Spontaneous ecchymoses: Secondary | ICD-10-CM | POA: Diagnosis present

## 2024-05-11 DIAGNOSIS — Z79890 Hormone replacement therapy (postmenopausal): Secondary | ICD-10-CM

## 2024-05-11 DIAGNOSIS — E118 Type 2 diabetes mellitus with unspecified complications: Secondary | ICD-10-CM | POA: Diagnosis present

## 2024-05-11 DIAGNOSIS — Z87442 Personal history of urinary calculi: Secondary | ICD-10-CM

## 2024-05-11 DIAGNOSIS — B1920 Unspecified viral hepatitis C without hepatic coma: Secondary | ICD-10-CM | POA: Diagnosis present

## 2024-05-11 DIAGNOSIS — Z833 Family history of diabetes mellitus: Secondary | ICD-10-CM

## 2024-05-11 LAB — COMPREHENSIVE METABOLIC PANEL WITH GFR
ALT: 32 U/L (ref 0–44)
AST: 31 U/L (ref 15–41)
Albumin: 4.6 g/dL (ref 3.5–5.0)
Alkaline Phosphatase: 61 U/L (ref 38–126)
Anion gap: 14 (ref 5–15)
BUN: 20 mg/dL (ref 8–23)
CO2: 23 mmol/L (ref 22–32)
Calcium: 9.6 mg/dL (ref 8.9–10.3)
Chloride: 102 mmol/L (ref 98–111)
Creatinine, Ser: 1.05 mg/dL — ABNORMAL HIGH (ref 0.44–1.00)
GFR, Estimated: 57 mL/min — ABNORMAL LOW
Glucose, Bld: 154 mg/dL — ABNORMAL HIGH (ref 70–99)
Potassium: 4.1 mmol/L (ref 3.5–5.1)
Sodium: 139 mmol/L (ref 135–145)
Total Bilirubin: 1.8 mg/dL — ABNORMAL HIGH (ref 0.0–1.2)
Total Protein: 7.7 g/dL (ref 6.5–8.1)

## 2024-05-11 LAB — I-STAT CHEM 8, ED
BUN: 28 mg/dL — ABNORMAL HIGH (ref 8–23)
Calcium, Ion: 1.15 mmol/L (ref 1.15–1.40)
Chloride: 98 mmol/L (ref 98–111)
Creatinine, Ser: 1.1 mg/dL — ABNORMAL HIGH (ref 0.44–1.00)
Glucose, Bld: 176 mg/dL — ABNORMAL HIGH (ref 70–99)
HCT: 45 % (ref 36.0–46.0)
Hemoglobin: 15.3 g/dL — ABNORMAL HIGH (ref 12.0–15.0)
Potassium: 5.9 mmol/L — ABNORMAL HIGH (ref 3.5–5.1)
Sodium: 137 mmol/L (ref 135–145)
TCO2: 27 mmol/L (ref 22–32)

## 2024-05-11 LAB — DIFFERENTIAL
Abs Immature Granulocytes: 0.02 10*3/uL (ref 0.00–0.07)
Basophils Absolute: 0 10*3/uL (ref 0.0–0.1)
Basophils Relative: 1 %
Eosinophils Absolute: 0.2 10*3/uL (ref 0.0–0.5)
Eosinophils Relative: 3 %
Immature Granulocytes: 0 %
Lymphocytes Relative: 21 %
Lymphs Abs: 1.6 10*3/uL (ref 0.7–4.0)
Monocytes Absolute: 0.8 10*3/uL (ref 0.1–1.0)
Monocytes Relative: 10 %
Neutro Abs: 5.2 10*3/uL (ref 1.7–7.7)
Neutrophils Relative %: 65 %

## 2024-05-11 LAB — CBC
HCT: 45.6 % (ref 36.0–46.0)
Hemoglobin: 16.2 g/dL — ABNORMAL HIGH (ref 12.0–15.0)
MCH: 32.9 pg (ref 26.0–34.0)
MCHC: 35.5 g/dL (ref 30.0–36.0)
MCV: 92.7 fL (ref 80.0–100.0)
Platelets: 131 10*3/uL — ABNORMAL LOW (ref 150–400)
RBC: 4.92 MIL/uL (ref 3.87–5.11)
RDW: 12.4 % (ref 11.5–15.5)
WBC: 7.9 10*3/uL (ref 4.0–10.5)
nRBC: 0 % (ref 0.0–0.2)

## 2024-05-11 LAB — PROTIME-INR
INR: 1.1 (ref 0.8–1.2)
Prothrombin Time: 14.5 s (ref 11.4–15.2)

## 2024-05-11 LAB — APTT: aPTT: 26 s (ref 24–36)

## 2024-05-11 LAB — ETHANOL: Alcohol, Ethyl (B): <15 mg/dL

## 2024-05-11 LAB — CBG MONITORING, ED: Glucose-Capillary: 155 mg/dL — ABNORMAL HIGH (ref 70–99)

## 2024-05-11 MED ORDER — TENECTEPLASE 25 MG IV KIT
0.2500 mg/kg | PACK | Freq: Once | INTRAVENOUS | Status: AC
  Administered 2024-05-11: 14 mg via INTRAVENOUS

## 2024-05-11 MED ORDER — ACETAMINOPHEN 650 MG RE SUPP: 650.0000 mg | RECTAL | Status: DC | PRN

## 2024-05-11 MED ORDER — TENECTEPLASE 25 MG IV KIT
PACK | INTRAVENOUS | Status: AC
  Filled 2024-05-11: qty 5

## 2024-05-11 MED ORDER — SENNOSIDES-DOCUSATE SODIUM 8.6-50 MG PO TABS: 1.0000 | ORAL_TABLET | Freq: Every evening | ORAL | Status: DC | PRN

## 2024-05-11 MED ORDER — SODIUM CHLORIDE 0.9 % IV SOLN: INTRAVENOUS | Status: DC

## 2024-05-11 MED ORDER — IOHEXOL 350 MG/ML SOLN
100.0000 mL | Freq: Once | INTRAVENOUS | Status: AC | PRN
  Administered 2024-05-11: 100 mL via INTRAVENOUS

## 2024-05-11 MED ORDER — STROKE: EARLY STAGES OF RECOVERY BOOK
Freq: Once | Status: AC
  Filled 2024-05-11: qty 1

## 2024-05-11 MED ORDER — ACETAMINOPHEN 160 MG/5ML PO SOLN: 650.0000 mg | ORAL | Status: DC | PRN

## 2024-05-11 MED ORDER — FENTANYL CITRATE (PF) 100 MCG/2ML IJ SOLN
INTRAMUSCULAR | Status: AC
  Filled 2024-05-11: qty 2

## 2024-05-11 MED ORDER — ACETAMINOPHEN 325 MG PO TABS
650.0000 mg | ORAL_TABLET | ORAL | Status: DC | PRN
  Administered 2024-05-12 – 2024-05-13 (×5): 650 mg via ORAL
  Filled 2024-05-11 (×5): qty 2

## 2024-05-11 MED ORDER — PANTOPRAZOLE SODIUM 40 MG IV SOLR
40.0000 mg | Freq: Every day | INTRAVENOUS | Status: DC
  Administered 2024-05-11: 40 mg via INTRAVENOUS
  Filled 2024-05-11: qty 10

## 2024-05-11 NOTE — ED Provider Notes (Signed)
 " [place] EMERGENCY DEPARTMENT AT [facility] Provider Note   CSN: [id number] Arrival date & time: 05/11/24  2048  An emergency department physician performed an initial assessment on this suspected stroke patient at 2050.  Patient presents with: Code Stroke   Amber Peterson is a 69 y.o. female with past medical history notable for CAD status post PCI, HFpEF, hypertension, hyperlipidemia who presents to our facility for evaluation of a right M1 occlusion status post TNK.  Last known normal approximately 2015 where she developed slurred speech, left facial droop as well as left-sided weakness.  Was initially seen as a stroke alert at outside hospital with subsequent imaging finding showing distal right M1 occlusion, acute right MCA infarct with surrounding penumbra.  Initial NIH score of 7.  Decision was made to provide IV tenecteplase .  Patient was a place in dorsally for potential thrombectomy evaluation  HPI     Prior to Admission medications  Medication Sig Start Date End Date Taking? Authorizing Provider  acetaminophen  (TYLENOL ) 500 MG tablet Take 2,000 mg by mouth as needed for moderate pain.    [provider]  albuterol  (VENTOLIN  HFA) 108 (90 Base) MCG/ACT inhaler Inhale 2 puffs into the lungs every 6 (six) hours as needed for wheezing or shortness of breath.    [provider]  alendronate (FOSAMAX) 70 MG tablet Take 70 mg by mouth once a week. 11/27/22   [provider]  ALPRAZolam  (XANAX ) 0.5 MG tablet Take 0.5 mg by mouth 2 (two) times daily. 07/13/19   [provider]  Ascorbic Acid (VITAMIN C PO) Take 1 tablet by mouth daily. Patient not taking: Reported on 04/12/2024    [provider]  atorvastatin  (LIPITOR ) 40 MG tablet Take 1 tablet (40 mg total) by mouth daily. 03/21/24   Anner Alm ORN, MD  Biotin 1000 MCG tablet Take 1,000 mcg by mouth daily. Patient not taking: Reported on 04/12/2024    [provider]   budesonide-formoterol (SYMBICORT) 160-4.5 MCG/ACT inhaler Inhale 2 puffs into the lungs 2 (two) times daily. Patient not taking: Reported on 04/12/2024    [provider]  Cholecalciferol (DIALYVITE VITAMIN D 5000 PO) Take 5,000 Units by mouth daily. Patient not taking: Reported on 04/12/2024    [provider]  clopidogrel  (PLAVIX ) 75 MG tablet Take 1 tablet (75 mg total) by mouth daily. Do not start until you finish taking Effient  03/21/24   Anner Alm ORN, MD  furosemide  (LASIX ) 20 MG tablet Take 2 tablets (40 mg) for 3 days, then take 1 tablet (20 mg) daily. 09/09/21   Anner Alm ORN, MD  HYDROcodone-acetaminophen  (NORCO/VICODIN) 5-325 MG tablet Take 1 tablet by mouth every 8 (eight) hours. 04/12/21   [provider]  hydrocortisone cream 1 % Apply 1 application  topically daily as needed for itching.    [provider]  levothyroxine  (SYNTHROID ) 25 MCG tablet Take 25 mcg by mouth daily. 10/22/22   [provider]  metoprolol  succinate (TOPROL -XL) 25 MG 24 hr tablet Take 1 tablet (25 mg total) by mouth daily with supper. 04/15/23   Anner Alm ORN, MD  nitroGLYCERIN  (NITROSTAT ) 0.4 MG SL tablet Place 1 tablet (0.4 mg total) under the tongue every 5 (five) minutes as needed for chest pain. 01/13/23   Anner Alm ORN, MD  sacubitril -valsartan  (ENTRESTO ) 24-26 MG Take 1 tablet by mouth 2 (two) times daily. 04/18/24   Anner Alm ORN, MD  spironolactone  (ALDACTONE ) 25 MG tablet Take 0.5 tablets (12.5  mg total) by mouth daily. 04/15/23   Anner Alm ORN, MD  Tetrahydrozoline HCl (VISINE OP) Place 1 drop into both eyes daily as needed (dry eyes).    [provider]  traZODone  (DESYREL ) 50 MG tablet Take 150 mg by mouth at bedtime.    [provider]  venlafaxine  XR (EFFEXOR -XR) 150 MG 24 hr capsule Take 150 mg by mouth daily with breakfast.    [provider]  vitamin B-12 (CYANOCOBALAMIN ) 1000 MCG tablet Take 1,000 mcg by mouth  daily. Patient not taking: Reported on 04/12/2024    [provider]  zinc gluconate 50 MG tablet Take 50 mg by mouth daily. Patient not taking: Reported on 04/12/2024    [provider]    Allergies: Shellfish allergy, Other, Penicillins, Sulfa antibiotics, and Tape    Review of Systems  Updated Vital Signs BP 126/86 (BP Location: Right Arm)   Pulse 90   Resp 14   Ht 4' 10 (1.473 m)   Wt 55.4 kg   BMI 25.52 kg/m   Physical Exam HENT:     Head: Normocephalic.     Right Ear: External ear normal.     Left Ear: External ear normal.     Nose: Nose normal.     Mouth/Throat:     Mouth: Mucous membranes are moist.  Eyes:     Extraocular Movements: Extraocular movements intact.     Conjunctiva/sclera: Conjunctivae normal.     Pupils: Pupils are equal, round, and reactive to light.  Cardiovascular:     Rate and Rhythm: Normal rate.     Pulses: Normal pulses.  Pulmonary:     Effort: Pulmonary effort is normal.  Abdominal:     General: Abdomen is flat.  Musculoskeletal:        General: Normal range of motion.     Cervical back: Normal range of motion.  Skin:    General: Skin is warm.     Capillary Refill: Capillary refill takes less than 2 seconds.  Neurological:     General: No focal deficit present.     Mental Status: She is alert and oriented to person, place, and time.     Cranial Nerves: No cranial nerve deficit.     Motor: No weakness.  Psychiatric:        Mood and Affect: Mood normal.     (all labs ordered are listed, but only abnormal results are displayed) Labs Reviewed  CBC - Abnormal; Notable for the following components:      Result Value   Hemoglobin 16.2 (*)    Platelets 131 (*)    All other components within normal limits  COMPREHENSIVE METABOLIC PANEL WITH GFR - Abnormal; Notable for the following components:   Glucose, Bld 154 (*)    Creatinine, Ser 1.05 (*)    Total Bilirubin 1.8 (*)    GFR, Estimated 57 (*)    All other  components within normal limits  CBG MONITORING, ED - Abnormal; Notable for the following components:   Glucose-Capillary 155 (*)    All other components within normal limits  PROTIME-INR  APTT  DIFFERENTIAL  ETHANOL  URINE DRUG SCREEN  I-STAT CHEM 8, ED    EKG: None  Radiology: CT ANGIO HEAD NECK W WO CM (CODE STROKE) Result Date: 05/11/2024 CLINICAL DATA:  Initial evaluation for acute neuro deficit, stroke suspected. EXAM: CT ANGIOGRAPHY HEAD AND NECK CT PERFUSION BRAIN TECHNIQUE: Multidetector CT imaging of the head and neck was performed using the  standard protocol during bolus administration of intravenous contrast. Multiplanar CT image reconstructions and MIPs were obtained to evaluate the vascular anatomy. Carotid stenosis measurements (when applicable) are obtained utilizing NASCET criteria, using the distal internal carotid diameter as the denominator. Multiphase CT imaging of the brain was performed following IV bolus contrast injection. Subsequent parametric perfusion maps were calculated using RAPID software. RADIATION DOSE REDUCTION: This exam was performed according to the departmental dose-optimization program which includes automated exposure control, adjustment of the mA and/or kV according to patient size and/or use of iterative reconstruction technique. CONTRAST:  OMNIPAQUE  IOHEXOL  350 MG/ML SOLN COMPARISON:  Comparison made with head CT from earlier the same day. FINDINGS: CTA NECK FINDINGS Aortic arch: Aortic arch within normal limits for caliber with standard branch pattern. Mild aortic atherosclerosis. No stenosis about the origin the great vessels. Right carotid system: Right common and internal carotid arteries are patent without dissection. Moderate atheromatous change about the right carotid bulb/proximal cervical right ICA without hemodynamically significant greater than 50% stenosis. Left carotid system: Left common and internal carotid arteries are patent without  dissection. Moderate atheromatous change about the left carotid bulb without hemodynamically significant greater than 50% stenosis. Vertebral arteries: Both vertebral arteries arise from the subclavian arteries. Moderate atheromatous stenosis at the origin of the left vertebral artery. Vertebral arteries otherwise patent without stenosis or dissection. Skeleton: No worrisome osseous lesions. Reversal of the normal cervical lordosis with grade 1 anterolisthesis of C3 on C4 and C4 on C5. Moderate spondylosis at C5-6 and C6-7. Other neck: No other acute finding. Upper chest: No other acute finding. Review of the MIP images confirms the above findings CTA HEAD FINDINGS Anterior circulation: Atheromatous change about the carotid siphons bilaterally. Resultant moderate to severe stenosis at the para clinoid right ICA (series 6, image 127). No significant stenosis about the contralateral left siphon. A1 segments patent bilaterally. Normal anterior communicating complex. Anterior cerebral arteries patent without stenosis. Left M1 segment and distal left MCA branches are patent and well perfused. Focal severe stenosis versus short-segment subtotal occlusion of the mid-distal right M1 segment (series 8, image 110). A radiographic string sign is present. Right MCA and its branches remain patent and perfused distally. Posterior circulation: Both V4 segments patent without significant stenosis. Both PICA patent. Basilar patent without stenosis. Superior cerebral arteries patent bilaterally. Both PCAs primarily supplied via the basilar. Multifocal moderate stenoses about the right P1/P2 segments (series 12, image 21). Left PCA patent without significant stenosis. Patent allowing for timing the contrast bolus. Venous sinuses: None significant.  No aneurysm. Anatomic variants: 9 Review of the MIP images confirms the above findings CT Brain Perfusion Findings: ASPECTS: 9 CBF (<30%) Volume: 37mL Perfusion (Tmax>6.0s) volume: 83mL  Mismatch Volume: 46mL Infarction Location:Acute right MCA core infarct centered at the operculum. 46 mL surrounding ischemic penumbra, mismatch regio 2.2. IMPRESSION: 1. Focal severe stenosis versus short-segment subtotal occlusion of the mid-distal right M1 segment. A radiographic string sign is present. Right MCA and its branches remain patent and perfused distally. 2. Acute right MCA core infarct centered at the operculum, with 46 mL surrounding ischemic penumbra. 3. Atheromatous change about the right carotid siphon with associated moderate to severe stenosis at the para clinoid segment. 4. Moderate atheromatous stenosis at the origin of the left vertebral artery. 5. Multifocal moderate stenoses about the right P1/P2 segments. Aortic Atherosclerosis (ICD10-I70.0). Critical Value/emergent results were called by telephone at the time of interpretation on 05/11/2024 at 9:50 pm to provider Dr. Norlin, Who verbally acknowledged  these results. Electronically Signed   By: Morene Hoard M.D.   On: 05/11/2024 22:04   CT CEREBRAL PERFUSION W CONTRAST Result Date: 05/11/2024 CLINICAL DATA:  Initial evaluation for acute neuro deficit, stroke suspected. EXAM: CT ANGIOGRAPHY HEAD AND NECK CT PERFUSION BRAIN TECHNIQUE: Multidetector CT imaging of the head and neck was performed using the standard protocol during bolus administration of intravenous contrast. Multiplanar CT image reconstructions and MIPs were obtained to evaluate the vascular anatomy. Carotid stenosis measurements (when applicable) are obtained utilizing NASCET criteria, using the distal internal carotid diameter as the denominator. Multiphase CT imaging of the brain was performed following IV bolus contrast injection. Subsequent parametric perfusion maps were calculated using RAPID software. RADIATION DOSE REDUCTION: This exam was performed according to the departmental dose-optimization program which includes automated exposure control, adjustment of  the mA and/or kV according to patient size and/or use of iterative reconstruction technique. CONTRAST:  100mL OMNIPAQUE  IOHEXOL  350 MG/ML SOLN COMPARISON:  Comparison made with head CT from earlier the same day. FINDINGS: CTA NECK FINDINGS Aortic arch: Aortic arch within normal limits for caliber with standard branch pattern. Mild aortic atherosclerosis. No stenosis about the origin the great vessels. Right carotid system: Right common and internal carotid arteries are patent without dissection. Moderate atheromatous change about the right carotid bulb/proximal cervical right ICA without hemodynamically significant greater than 50% stenosis. Left carotid system: Left common and internal carotid arteries are patent without dissection. Moderate atheromatous change about the left carotid bulb without hemodynamically significant greater than 50% stenosis. Vertebral arteries: Both vertebral arteries arise from the subclavian arteries. Moderate atheromatous stenosis at the origin of the left vertebral artery. Vertebral arteries otherwise patent without stenosis or dissection. Skeleton: No worrisome osseous lesions. Reversal of the normal cervical lordosis with grade 1 anterolisthesis of C3 on C4 and C4 on C5. Moderate spondylosis at C5-6 and C6-7. Other neck: No other acute finding. Upper chest: No other acute finding. Review of the MIP images confirms the above findings CTA HEAD FINDINGS Anterior circulation: Atheromatous change about the carotid siphons bilaterally. Resultant moderate to severe stenosis at the para clinoid right ICA (series 6, image 127). No significant stenosis about the contralateral left siphon. A1 segments patent bilaterally. Normal anterior communicating complex. Anterior cerebral arteries patent without stenosis. Left M1 segment and distal left MCA branches are patent and well perfused. Focal severe stenosis versus short-segment subtotal occlusion of the mid-distal right M1 segment (series 8,  image 110). A radiographic string sign is present. Right MCA and its branches remain patent and perfused distally. Posterior circulation: Both V4 segments patent without significant stenosis. Both PICA patent. Basilar patent without stenosis. Superior cerebral arteries patent bilaterally. Both PCAs primarily supplied via the basilar. Multifocal moderate stenoses about the right P1/P2 segments (series 12, image 21). Left PCA patent without significant stenosis. Patent allowing for timing the contrast bolus. Venous sinuses: None significant.  No aneurysm. Anatomic variants: 9 Review of the MIP images confirms the above findings CT Brain Perfusion Findings: ASPECTS: 9 CBF (<30%) Volume: 37mL Perfusion (Tmax>6.0s) volume: 83mL Mismatch Volume: 46mL Infarction Location:Acute right MCA core infarct centered at the operculum. 46 mL surrounding ischemic penumbra, mismatch regio 2.2. IMPRESSION: 1. Focal severe stenosis versus short-segment subtotal occlusion of the mid-distal right M1 segment. A radiographic string sign is present. Right MCA and its branches remain patent and perfused distally. 2. Acute right MCA core infarct centered at the operculum, with 46 mL surrounding ischemic penumbra. 3. Atheromatous change about the right carotid siphon  with associated moderate to severe stenosis at the para clinoid segment. 4. Moderate atheromatous stenosis at the origin of the left vertebral artery. 5. Multifocal moderate stenoses about the right P1/P2 segments. Aortic Atherosclerosis (ICD10-I70.0). Critical Value/emergent results were called by telephone at the time of interpretation on 05/11/2024 at 9:50 pm to provider Dr. Norlin, Who verbally acknowledged these results. Electronically Signed   By: Morene Hoard M.D.   On: 05/11/2024 22:04   CT HEAD CODE STROKE WO CONTRAST (LKW 0-4.5h, LVO 0-24h) Result Date: 05/11/2024 CLINICAL DATA:  Code stroke. Initial evaluation for acute neuro deficit, stroke suspected. EXAM: CT  HEAD WITHOUT CONTRAST TECHNIQUE: Contiguous axial images were obtained from the base of the skull through the vertex without intravenous contrast. RADIATION DOSE REDUCTION: This exam was performed according to the departmental dose-optimization program which includes automated exposure control, adjustment of the mA and/or kV according to patient size and/or use of iterative reconstruction technique. COMPARISON:  Prior CT from 06/08/2020. FINDINGS: Brain: Cerebral volume within normal limits. No acute intracranial hemorrhage. Subtle loss of gray-white matter differentiation at the right lentiform nucleus, concerning for an acute right MCA territory infarct. Gray-white matter differentiation otherwise grossly maintained at this time. No mass lesion or midline shift. No hydrocephalus or extra-axial fluid collection. Vascular: Question asymmetric hyperdensity in the region of a proximal right M2 branch at the right sylvian fissure (series 3, image 11). Skull: Scalp soft tissues demonstrate no acute finding. Calvarium intact. Sinuses/Orbits: Right gaze preference noted. Visualized paranasal sinuses are clear. No significant mastoid effusion. Other: None. ASPECTS [facility] Stroke Program Early CT Score) - Ganglionic level infarction (caudate, lentiform nuclei, internal capsule, insula, M1-M3 cortex): 6 - Supraganglionic infarction (M4-M6 cortex): 3 Total score (0-10 with 10 being normal): 9 IMPRESSION: 1. Subtle loss of gray-white matter differentiation at the right lentiform nucleus, concerning for an acute right MCA territory infarct. No intracranial hemorrhage. 2. Aspects is 9. 3. Question asymmetric hyperdensity in the region of a proximal right M2 branch at the right Sylvian fissure, which could reflect thrombus. Correlation with dedicated CTA recommended. Results communicated by telephone at the time of interpretation on 05/11/2024 at 9:20 pm to provider Dr. Norlin, who verbally acknowledged these results.  Electronically Signed   By: Morene Hoard M.D.   On: 05/11/2024 21:24    Procedures   Medications Ordered in the ED  tenecteplase  (TNKASE ) injection for stroke 14 mg (14 mg Intravenous Given 05/11/24 2128)  iohexol  (OMNIPAQUE ) 350 MG/ML injection 100 mL (100 mLs Intravenous Contrast Given 05/11/24 2144)                                 Medical Decision Making Risk Prescription drug management. Decision regarding hospitalization.   Patient is a 69 year old female who presents today for evaluation of stroke evaluation with known right MCA stroke and M1 occlusion.  On my initial assessment patient was noted to be hemodynamically stable and afebrile.  Patient was resting comfortably.  No obvious focal deficits at this point in time.  Patient was evaluated by neurology.  No IR at this point in time and will admit to the neurology service for further management post TNK.  Patient did not require any additional intervention here in the emergency department.   Final diagnoses:  Acute CVA (cerebrovascular accident) [facility])    ED Discharge Orders     None          Laurita Sieving, MD 05/11/24  2329    Theadore Ozell HERO, MD 05/12/24 [phone]  "

## 2024-05-11 NOTE — Anesthesia Preprocedure Evaluation (Signed)
 "                                  Anesthesia Evaluation    Reviewed: Allergy & Precautions, Patient's Chart, lab work & pertinent test results, Unable to perform ROS - Chart review onlyPreop documentation limited or incomplete due to emergent nature of procedure.  Airway        Dental   Pulmonary shortness of breath, asthma , former smoker          Cardiovascular Exercise Tolerance: Good hypertension, Pt. on medications and Pt. on home beta blockers + CAD, + Past MI, + Cardiac Stents and +CHF    Echo 03/2021  1. Left ventricular ejection fraction, by estimation, is 55 to 60%. The left  ventricle has normal function. The left ventricle demonstrates regional  wall motion abnormalities. The apical septal segments and apex are akinetic.  The mid septal segments are  hypokinetic. There is mild concentric left  ventricular hypertrophy. Left ventricular diastolic parameters are consistent  with Grade I diastolic dysfunction (impaired relaxation).   2. Right ventricular systolic function is normal. The right ventricular size is  normal.   3. The mitral valve is normal in structure. Trivial mitral valve regurgitation.   4. The aortic valve is tricuspid. There is mild calcification of the aortic valve.  There is mild thickening of the aortic valve. Aortic valve regurgitation is not  visualized. Aortic valve sclerosis/calcification is present, without any  evidence of aortic stenosis.   5. The inferior vena cava is normal in size with greater than 50% respiratory  variability, suggesting right atrial pressure of 3 mmHg.   Comparison(s): Compared to prior TTE on 12/09/20, the LVEF has significantly  improved to 55-60% (previously 30-35%) with improved wall motion.   Echo 11/2020  1. Left ventricular ejection fraction, by estimation, is 30 to 35%. The  left ventricle has moderately decreased function. The left ventricle  demonstrates regional wall motion abnormalities with mid  to apical  anterior, anteroseptal, and inferoseptal akinesis, apical inferior akinesis,  apical lateral akinesis, akinesis of the true apex. There is mild left  ventricular hypertrophy. Left ventricular diastolic parameters are  consistent with Grade I diastolic dysfunction (impaired relaxation).   2. Right ventricular systolic function is normal. The right ventricular  size is normal.   3. Left atrial size was mildly dilated.   4. The mitral valve is normal in structure. No evidence of mitral valve  regurgitation. No evidence of mitral stenosis.   5. The aortic valve is tricuspid. Aortic valve regurgitation is not  visualized. No aortic stenosis is present.   6. The inferior vena cava is normal in size with greater than 50%  respiratory variability, suggesting right atrial pressure of 3 mmHg.   7. A small pericardial effusion is present.      Cath 11/2020   Ost Cx lesion is 30% stenosed.   Prox LAD lesion is 50% stenosed.   2nd Mrg lesion is 90% stenosed.   Mid Cx lesion is 99% stenosed.   Prox Cx to Mid Cx lesion is 25% stenosed.   Previously placed Prox LAD to Mid LAD stent (unknown type) is  widely patent.   A drug-eluting stent was successfully placed.   A stent was successfully placed.   Post intervention, there is a 0% residual stenosis.   Post intervention, there is a 20% residual stenosis.   Post intervention, there is  a 0% residual stenosis.   Short left main coronary artery which bifurcated into the LAD and left circumflex.  There is smooth 50% narrowing in the LAD proximal to the septal perforating artery and the previously placed LAD stent.  The LAD stent is widely patent.   There was initial ostial narrowing of the circumflex with ST elevation with initial catheter engagement resulting in need for XB 3.0 guide catheter with sideholes.  The circumflex gave rise to a large second marginal vessel with 90% stenosis.  The small AV groove circumflex was 99%  stenosed after marginal vessel takeoff.   Successful PTCA stenting of the circumflex into the OM 2 vessel with ultimate insertion of a 2.0 x 15 mm Onyx frontier stent postdilated to 2.3 mm.  PTCA of the subtotal AV groove circumflex with a 1.5 x 12 mm balloon.  There was thrombus formation in the AV vessel which had increased after stenting into the marginal vessel.  The AV groove circumflex was rewired and successfully re-dilated.   Patient was started on Aggrastat  bolus plus infusion with residual thrombus;  there was no evidence for dissection.     Neuro/Psych  Headaches PSYCHIATRIC DISORDERS Anxiety Depression    CVA    GI/Hepatic ,,,(+) Hepatitis -, C  Endo/Other  diabetes    Renal/GU K+ 4.2 Cr 0.8     Musculoskeletal  (+) Arthritis ,    Abdominal   Peds  Hematology Hgb 13.5 Plt 141   Anesthesia Other Findings All PC, Sulfa  Reproductive/Obstetrics                              Anesthesia Physical Anesthesia Plan  ASA: 3 and emergent  Anesthesia Plan: General   Post-op Pain Management:    Induction: Intravenous  PONV Risk Score and Plan: 4 or greater and Treatment may vary due to age or medical condition, Ondansetron , Dexamethasone  and Midazolam   Airway Management Planned: Oral ETT  Additional Equipment: None  Intra-op Plan:   Post-operative Plan: Post-operative intubation/ventilation  Informed Consent:      History available from chart only and Only emergency history available  Plan Discussed with:   Anesthesia Plan Comments: (GA w R ISB w exparel)         Anesthesia Quick Evaluation  "

## 2024-05-11 NOTE — ED Triage Notes (Signed)
 [facility] from home as a code stroke. Ems was called out for a LWK of 2015. Called code stroke in field at 2037 Patient was with family when they noticed that she started having slurred speech, left droop and weakness with a right gaze preference  Cbg 162  123/69 89 NSR 98% RA

## 2024-05-11 NOTE — H&P (Signed)
 NEUROLOGY H&P NOTE   Date of service: May 11, 2024 Patient Name: Amber Peterson MRN:  [id number] DOB:  [birth year] Chief Complaint: R gaze, L sided weakness, R MCA occlusion  History of Present Illness  Amber Peterson is a 69 y.o. female with hx of hepatitis C, hypertension, hyperlipidemia, CAD, ischemic cardiomyopathy who initially presented to [facility] ED by [facility] EMS for left-sided weakness and a right gaze deviation.  Last seen normal is 2015.  Patient was at home with her significant other.  He was fixing hinges in the kitchen cabinets and he heard the patient fall and noticed that her speech is slurred and she had significant trouble with standing up.  EMS was called and patient noted to have a left facial droop and left-sided weakness along with a right gaze preference and she was brought into [facility] ED as a code stroke.  Patient was eval by teleneurology and no clear contraindications to TNKase  were found.  CT head with an aspects of 9 with some early changes in the right basal ganglia but no clear ICH.  She was given TNKase  at 2128.  Patient subsequently had a CTA of the head and neck which shows subocclusive thrombus/stenosis in the distal right M1 along with right carotid bifurcation bulky plaque with a ? thrombus protruding into the lumen.  CT perfusion with a 37 mL core along with a 46 mL of penumbra with a mismatch ratio of 2.2.  Prior to TNKase , patient was noted to have a NIH stroke scale of 7 for left arm, left leg weakness along with a left facial droop and dysarthric speech.  Post TNKase , both the ED doctor and the teleneurologist evaluated the patient and the consensus was that there was no significant immediate improvement noted.  Case was discussed with me and Dr. Janjua and given disabling nature of her symptoms, thrombectomy was offered.  However, upon evaluation on arrival to [facility], ED, patient's symptoms had significantly improved  with only residual mild right gaze preference and NIH stroke scale of 1.  Her deficit at this time was deemed by me to be nondisabling.  Therefore, I discussed with Dr. Janjua and we canceled code IR and emergent thrombectomy.  Last known well: 2015 on 05/11/2024 Modified rankin score: 1-No significant post stroke disability and can perform usual duties with stroke symptoms IV Thrombolysis: Yes. Thrombectomy: Was initially offered, but had significant improvement on arrival to [facility] with a residual NIH stroke scale of 1.  Symptoms at this time are nondisabling and therefore code IR was canceled and patient was not deemed to be a candidate for thrombectomy at this time.  NIHSS components Score: Comment  1a Level of Conscious 0[x]  1[]  2[]  3[]      1b LOC Questions 0[x]  1[]  2[]       1c LOC Commands 0[x]  1[]  2[]       2 Best Gaze 0[]  1[x]  2[]       3 Visual 0[x]  1[]  2[]  3[]      4 Facial Palsy 0[x]  1[]  2[]  3[]      5a Motor Arm - left 0[x]  1[]  2[]  3[]  4[]  UN[]    5b Motor Arm - Right 0[x]  1[]  2[]  3[]  4[]  UN[]    6a Motor Leg - Left 0[x]  1[]  2[]  3[]  4[]  UN[]    6b Motor Leg - Right 0[x]  1[]  2[]  3[]  4[]  UN[]    7 Limb Ataxia 0[x]  1[]  2[]  UN[]      8 Sensory 0[x]  1[]  2[]  UN[]   9 Best Language 0[x]  1[]  2[]  3[]      10 Dysarthria 0[x]  1[]  2[]  UN[]      11 Extinct. and Inattention 0[x]  1[]  2[]       TOTAL: 1      ROS  Comprehensive ROS performed and pertinent positives documented in the HPI  Past History   Past Medical History:  Diagnosis Date   Acute ST elevation myocardial infarction (STEMI) due to occlusion of distal portion of left anterior descending (LAD) coronary artery (HCC) 12/08/2020   100 % prox-mid LAD - DES PCI (also staged PCI-PTCA of LCx-OM2 (stent)-AVGVCx (PTCA).  Initial EF 30-35% -> Improved to 55-60% on 4 month f/u Echo.   Anxiety    Arthritis    all major joint   Asthma 07/22/2017   Chronic headaches    Depression    Hepatitis C 07/22/2017   History of kidney stones     Hypertension    Ischemic cardiomyopathy - RESOLVED 12/10/2020   EF 30 to 35% with anterior, anterolateral and inferior hypokinesis to akinesis consistent with LAD infarct. -> 4 month Post-MI / PCI Echo - EF 55-60%.   ~2 Vessel CORONARY ARTERY DISEASE -  LAD & LCx-OM2 12/08/2020   12/08/2020: 100 % mLAD - DES PCI Onyx Frontier DES 2.5 x 26; 80%-99% bifurcation LCx-OM/AVG Cx (Sttaged PCI)> DES PCI Prox-Mid Cx 25%->OM2 90% -> DES PCI ONYX FRONTIER 2.0 x 15 -> 2.3 mm; PTCA of AVG LCx (thrombotic 99%) -> 1.5 mm Balloon PTCA (through stent) - 20% residual.   Past Surgical History:  Procedure Laterality Date   ABDOMINAL HYSTERECTOMY  1995   ANKLE FRACTURE SURGERY Left 01/26/2021   and leg   CORONARY BALLOON ANGIOPLASTY N/A 12/10/2020   Procedure: CORONARY BALLOON ANGIOPLASTY;  Surgeon: Burnard Debby LABOR, MD;  Location: [facility] INVASIVE CV LAB;  Service: Cardiovascular;; STAGED Bifurcation LCx-OM2-AVGCx: PTCA of AVG LCx (thrombotic 99%) -> 1.5 mm Balloon PTCA (through stent into OM2) - 20% residual.   CORONARY STENT INTERVENTION N/A 12/08/2020   Procedure: CORONARY STENT INTERVENTION;  Surgeon: Court Dorn PARAS, MD;  Location: [facility] INVASIVE CV LAB;  Service: Cardiovascular;; Prox-Mid LAD 100% (DES PCI ONYX FRONTIER 2.5 x 26 mm -2.6 mm   CORONARY STENT INTERVENTION N/A 12/10/2020   Procedure: CORONARY STENT INTERVENTION;  Surgeon: Burnard Debby LABOR, MD;  Location: [facility] INVASIVE CV LAB;  Service: Cardiovascular;; STAGED DES PCI Prox-Mid Cx 25%->OM2 90% -> DES PCI ONYX FRONTIER 2.0 x 15 -> 2.3 mm   CORONARY/GRAFT ACUTE MI REVASCULARIZATION N/A 12/08/2020   Procedure: Coronary/Graft Acute MI Revascularization;  Surgeon: Court Dorn PARAS, MD;  Location: [facility] INVASIVE CV LAB;  Service: Cardiovascular;  Laterality: N/A;   LEFT HEART CATH N/A 12/10/2020   Procedure: Left Heart Cath;  Surgeon: Burnard Debby LABOR, MD;  Location: [facility] INVASIVE CV LAB;  Service: Cardiovascular; Catheter spasm of LM on initial Guide placement. ->  Smooth ~50% prox LAD (pre-stent), patent stent.  30% Ost LCx, 25% m LCx, Ost OM2 90% & thrombotic AVG LCx 99% LVEDP 13 mmHg   LEFT HEART CATH AND CORONARY ANGIOGRAPHY N/A 12/08/2020   Procedure: LEFT HEART CATH AND CORONARY ANGIOGRAPHY;  Surgeon: Court Dorn PARAS, MD;  Location: [facility] INVASIVE CV LAB;  Service: Cardiovascular; (Ant STEMI): Prox RCA 30%. Short LM. Prox-mid LCx 80% (actually into OM2) & AVG LCVx 99%; Prox-Mid LAD 100%  (DES PCI LAD, Staged PCI of LCx)   REVERSE SHOULDER ARTHROPLASTY Right 03/22/2019   Procedure: REVERSE SHOULDER ARTHROPLASTY;  Surgeon: Cristy Bonner DASEN, MD;  Location: [facility] ORS;  Service: Orthopedics;  Laterality: Right;   TONSILLECTOMY  2000   TRANSTHORACIC ECHOCARDIOGRAM  12/09/2020   (Ant STEMI on 12/08/2020: EF 30 to 35%.  Moderate reduced EF.  Mid-apical anterior, anteroseptal and inferoseptal AK.  Also apical inferior, apical lateral and apical akinesis.  GR 1 DD.  Mild LA dilation.  Normal RV size, function and RVP, RAP.SABRA  Normal valves.   TRANSTHORACIC ECHOCARDIOGRAM  03/24/2021   (3 months post Anterior STEMI with LAD and LCx PCI w/ initial) EF 30-35%) : EF IMPROVED to 55-60%. LV with regional wall motion abnormalities. Apical septal segments and apex are akinetic. Mid segments are hypokinetic. Mild concentric LVH. Grade 1 DD.   Family History  Problem Relation Age of Onset   Angina Mother    Depression Mother    Heart attack Father    Stroke Father    Diabetes Sister    Heart disease Maternal Grandmother    Hyperlipidemia Maternal Grandfather    Heart disease Paternal Grandmother    Diabetes Maternal Aunt    Social History   Socioeconomic History   Marital status: Divorced    Spouse name: Not on file   Number of children: 1   Years of education: Not on file   Highest education level: Not on file  Occupational History   Occupation: retired  Tobacco Use   Smoking status: Former    Current packs/day: 0.00    Types: Cigarettes    Quit date: 1988     Years since quitting: 38.1   Smokeless tobacco: Never  Vaping Use   Vaping status: Never Used  Substance and Sexual Activity   Alcohol use: Not Currently    Comment: occasional   Drug use: Never   Sexual activity: Not on file  Other Topics Concern   Not on file  Social History Narrative   Not on file   Social Drivers of Health   Tobacco Use: Medium Risk (04/12/2024)   Patient History    Smoking Tobacco Use: Former    Smokeless Tobacco Use: Never    Passive Exposure: Not on Actuary Strain: Not on file  Food Insecurity: Not on file  Transportation Needs: Not on file  Physical Activity: Not on file  Stress: Not on file  Social Connections: Not on file  Depression (PHQ2-9): Medium Risk (06/09/2021)   Depression (PHQ2-9)    PHQ-2 Score: 7  Alcohol Screen: Not on file  Housing: Not on file  Utilities: Not on file  Health Literacy: Not on file   Allergies[1]  Medications  Current Medications[2]   Vitals   Vitals:   05/11/24 2106 05/11/24 2118 05/11/24 2120 05/11/24 2257  BP:   126/86 128/73  Pulse:   90 72  Resp:   14 16  Temp:    98.1 F (36.7 C)  TempSrc:    Oral  SpO2:    98%  Weight:  55.4 kg    Height: 4' 10 (1.473 m)        Body mass index is 25.52 kg/m.  Physical Exam   General: Laying comfortably in bed; in no acute distress.  HENT: Normal oropharynx and mucosa. Normal external appearance of ears and nose.  Neck: Supple, no pain or tenderness  CV: No JVD. No peripheral edema.  Pulmonary: Symmetric Chest rise. Normal respiratory effort.  Abdomen: Soft to touch, non-tender.  Ext: No cyanosis, edema, or deformity  Skin: No rash. Normal palpation of skin.   Musculoskeletal: Normal  digits and nails by inspection. No clubbing.   Neurologic Examination  Mental status/Cognition: Alert, oriented to self, place, month and year, good attention.  Speech/language: Fluent, comprehension intact, object naming intact, repetition intact.   Cranial nerves:   CN II Pupils equal and reactive to light, no VF deficits    CN III,IV,VI EOM intact, no nystagmus. Has a R gaze preference but easily crosses midline.   CN V normal sensation in V1, V2, and V3 segments bilaterally    CN VII no asymmetry, no nasolabial fold flattening    CN VIII normal hearing to speech    CN IX & X normal palatal elevation, no uvular deviation    CN XI 5/5 head turn and 5/5 shoulder shrug bilaterally    CN XII midline tongue protrusion    Motor:  Muscle bulk: normal, tone normal, pronator drift none, tremor none Mvmt Root Nerve  Muscle Right Left Comments  SA C5/6 Ax Deltoid 5 5   EF C5/6 Mc Biceps 5 5   EE C6/7/8 Rad Triceps 5 5   WF C6/7 Med FCR     WE C7/8 PIN ECU     F Ab C8/T1 U ADM/FDI 5 5   HF L1/2/3 Fem Illopsoas 5 5   KE L2/3/4 Fem Quad 5 5   DF L4/5 D Peron Tib Ant 5 5   PF S1/2 Tibial Grc/Sol 5 5    Sensation:  Light touch Intact throughout   Pin prick    Temperature    Vibration   Proprioception    Coordination/Complex Motor:  - Finger to Nose intact bilaterally - Heel to shin intact bilaterally - Rapid alternating movement are normal - Gait: Deferred for patient's safety. Labs   CBC:  Recent Labs  Lab 05/11/24 2055 05/11/24 2330  WBC 7.9  --   NEUTROABS 5.2  --   HGB 16.2* 15.3*  HCT 45.6 45.0  MCV 92.7  --   PLT 131*  --    Basic Metabolic Panel:  Lab Results  Component Value Date   NA 137 05/11/2024   K 5.9 (H) 05/11/2024   CO2 23 05/11/2024   GLUCOSE 176 (H) 05/11/2024   BUN 28 (H) 05/11/2024   CREATININE 1.10 (H) 05/11/2024   CALCIUM  9.6 05/11/2024   GFRNONAA 57 (L) 05/11/2024   GFRAA >60 03/15/2019   Lipid Panel:  Lab Results  Component Value Date   LDLCALC 45 01/21/2021   HgbA1c:  Lab Results  Component Value Date   HGBA1C 5.6 12/08/2020   Urine Drug Screen: No results found for: LABOPIA, COCAINSCRNUR, LABBENZ, AMPHETMU, THCU, LABBARB  Alcohol Level     Component Value  Date/Time   [facility] <15 05/11/2024 2055   INR  Lab Results  Component Value Date   INR 1.1 05/11/2024   APTT  Lab Results  Component Value Date   APTT 26 05/11/2024     CT Head without contrast(Personally reviewed): 1. Subtle loss of gray-white matter differentiation at the right lentiform nucleus, concerning for an acute right MCA territory infarct. No intracranial hemorrhage. 2. Aspects is 9.  CT angio Head and Neck with contrast(Personally reviewed): 1. Focal severe stenosis versus short-segment subtotal occlusion of the mid-distal right M1 segment. A radiographic string sign is present. Right MCA and its branches remain patent and perfused distally. 2. Acute right MCA core infarct centered at the operculum, with 46 mL surrounding ischemic penumbra. 3. Atheromatous change about the right carotid siphon with associated moderate to severe stenosis  at the para clinoid segment. 4. Moderate atheromatous stenosis at the origin of the left vertebral artery. 5. Multifocal moderate stenoses about the right P1/P2 segments.  CT perfusion(personally reviewed): Penumbra of 46 mL, core of 37 mL.  Mismatch ratio of 2.2.  MRI Brain(Personally reviewed): Pending.  Assessment   Amber Peterson is a 69 y.o. female with hx of hepatitis C, hypertension, hyperlipidemia, CAD, ischemic cardiomyopathy who initially presented to [facility] ED by [facility] EMS for left-sided weakness and a right gaze deviation.  Symptoms concerning for a right MCA infarct.  She was offered and given TNKase  at [facility] ED.  She was found to have a subocclusive stenosis/thrombus in the right distal M1 with a core of 37 mL and a penumbra of 46 mL and a mismatch ratio of 2.2.  Post TNKase , there was no noted immediate improvement and therefore she was offered thrombectomy.  However, upon arrival to [facility], ED, she was noted to have had significant improvement in her symptoms with now complete  resolution of the early and noted left-sided weakness and only a mild residual right gaze preference.  Her NIH stroke scale is a 1.  Given significant improvement and only mild nondisabling deficits, thrombectomy was canceled.  Patient will be admitted to the neuro ICU for close monitoring and post TNKase  checks.  As for the etiology of her infarcts, I suspect this could either be embolic from noted bulky plaque in the right ICA at the bifurcation or potentially cardioembolic.  Full stroke workup is pending.  Primary Diagnosis:  Cerebral infarction due to embolism of  right middle cerebral artery.   Secondary Diagnosis: Essential (primary) hypertension, hyperlipidemia, ischemic cardiomyopathy status post PCI and stenting of LAD.  Recommendations  - Frequent NeuroChecks for post tPA care per stroke unit protocol: - Initial CTH demonstrated no acute hemorrhage or mass - MRI Brain -pending - CTA -right MCA distal M1 subocclusive stenosis/thrombus. - CT Perfusion -37 mL core and 46 mL penumbra. - TTE -pending - Lipid Panel: LDL -pending  - Statin: Will continue home atorvastatin . - HbA1c: Pending - Antithrombotic: Start ASA 81 mg daily if 24 h CTH does not show acute hemorrhage - DVT prophylaxis: SCDs. Pharmacologic prophylaxis if 24 h CTH does not demonstrate acute hemorrhage - Systolic Blood Pressure goal: < 180 mm Hg - Telemetry monitoring for arrhythmia: 72 hours - Swallow screen - ordered - PT/OT/SLP consults - She quit smoking and marijuana several years ago.  Ischemic cardiomyopathy status post PCI and stenting of LAD: - Will hold home antiplatelet given patient is post TNKase . - Will use metoprolol  as needed for tonight only if heart rate goes above 120.  Hyperlipidemia: LDL is pending. Continue home atorvastatin .  Essential hypertension: Will hold home antihypertensives in the setting of post TNKase . Will use as needed labetalol  and Cleviprex for SBP above  goal.  ______________________________________________________________________  Plan of care was discussed with patient, her daughter, significant other at the bedside, as well as sister over the phone.  Allergies were verified and updated.  Patient is full code.  This patient is critically ill and at significant risk of neurological worsening, death and care requires constant monitoring of vital signs, hemodynamics,respiratory and cardiac monitoring, neurological assessment, discussion with family, other specialists and medical decision making of high complexity. I spent 60 minutes of neurocritical care time  in the care of  this patient. This was time spent independent of any time provided by nurse practitioner or PA.  Medea Deines [facility] 05/12/2024  12:02 AM   Signed, Jesus Nevills, MD [facility]      [1]  Allergies Allergen Reactions   Shellfish Allergy Anaphylaxis   Other Other (See Comments)    Allergic to hackleberrys per allergy test Walnuts cause asthma/shortness of breath   Penicillins Hives and Itching    Did it involve swelling of the face/tongue/throat, SOB, or low BP? No Did it involve sudden or severe rash/hives, skin peeling, or any reaction on the inside of your mouth or nose? No Did you need to seek medical attention at a hospital or doctor's office? No When did it last happen?      69 years old If all above answers are NO, may proceed with cephalosporin use.     Sulfa Antibiotics Hives and Itching   Tape     Causes blisters   [2]  Current Facility-Administered Medications:    [START ON 05/12/2024]  stroke: early stages of recovery book, , Does not apply, Once, Emmalene Kattner, MD   0.9 %  sodium chloride  infusion, , Intravenous, Continuous, Shakai Dolley, MD, Last Rate: 40 mL/hr at 05/11/24 2317, New Bag at 05/11/24 2317   acetaminophen  (TYLENOL ) tablet 650 mg, 650 mg, Oral, Q4H PRN **OR** acetaminophen   (TYLENOL ) 160 MG/5ML solution 650 mg, 650 mg, Per Tube, Q4H PRN **OR** acetaminophen  (TYLENOL ) suppository 650 mg, 650 mg, Rectal, Q4H PRN, Vishruth Seoane, MD   pantoprazole  (PROTONIX ) injection 40 mg, 40 mg, Intravenous, QHS, Alizeh Madril, MD, 40 mg at 05/11/24 2315   senna-docusate (Senokot-S) tablet 1 tablet, 1 tablet, Oral, QHS PRN, Neeko Pharo, MD  Current Outpatient Medications:    acetaminophen  (TYLENOL ) 500 MG tablet, Take 2,000 mg by mouth as needed for moderate pain., Disp: , Rfl:    albuterol  (VENTOLIN  HFA) 108 (90 Base) MCG/ACT inhaler, Inhale 2 puffs into the lungs every 6 (six) hours as needed for wheezing or shortness of breath., Disp: , Rfl:    alendronate (FOSAMAX) 70 MG tablet, Take 70 mg by mouth once a week., Disp: , Rfl:    ALPRAZolam  (XANAX ) 0.5 MG tablet, Take 0.5 mg by mouth 2 (two) times daily., Disp: , Rfl:    Ascorbic Acid (VITAMIN C PO), Take 1 tablet by mouth daily. (Patient not taking: Reported on 04/12/2024), Disp: , Rfl:    atorvastatin  (LIPITOR ) 40 MG tablet, Take 1 tablet (40 mg total) by mouth daily., Disp: 90 tablet, Rfl: 0   Biotin 1000 MCG tablet, Take 1,000 mcg by mouth daily. (Patient not taking: Reported on 04/12/2024), Disp: , Rfl:    budesonide-formoterol (SYMBICORT) 160-4.5 MCG/ACT inhaler, Inhale 2 puffs into the lungs 2 (two) times daily. (Patient not taking: Reported on 04/12/2024), Disp: , Rfl:    Cholecalciferol (DIALYVITE VITAMIN D 5000 PO), Take 5,000 Units by mouth daily. (Patient not taking: Reported on 04/12/2024), Disp: , Rfl:    clopidogrel  (PLAVIX ) 75 MG tablet, Take 1 tablet (75 mg total) by mouth daily. Do not start until you finish taking Effient , Disp: 90 tablet, Rfl: 0   furosemide  (LASIX ) 20 MG tablet, Take 2 tablets (40 mg) for 3 days, then take 1 tablet (20 mg) daily., Disp: 60 tablet, Rfl: 1   HYDROcodone-acetaminophen  (NORCO/VICODIN) 5-325 MG tablet, Take 1 tablet by mouth every 8 (eight) hours., Disp: , Rfl:     hydrocortisone cream 1 %, Apply 1 application  topically daily as needed for itching., Disp: , Rfl:    levothyroxine  (SYNTHROID ) 25 MCG tablet, Take 25 mcg by  mouth daily., Disp: , Rfl:    metoprolol  succinate (TOPROL -XL) 25 MG 24 hr tablet, Take 1 tablet (25 mg total) by mouth daily with supper., Disp: 90 tablet, Rfl: 2   nitroGLYCERIN  (NITROSTAT ) 0.4 MG SL tablet, Place 1 tablet (0.4 mg total) under the tongue every 5 (five) minutes as needed for chest pain., Disp: 25 tablet, Rfl: 3   sacubitril -valsartan  (ENTRESTO ) 24-26 MG, Take 1 tablet by mouth 2 (two) times daily., Disp: 180 tablet, Rfl: 3   spironolactone  (ALDACTONE ) 25 MG tablet, Take 0.5 tablets (12.5 mg total) by mouth daily., Disp: 45 tablet, Rfl: 2   Tetrahydrozoline HCl (VISINE OP), Place 1 drop into both eyes daily as needed (dry eyes)., Disp: , Rfl:    traZODone  (DESYREL ) 50 MG tablet, Take 150 mg by mouth at bedtime., Disp: , Rfl:    venlafaxine  XR (EFFEXOR -XR) 150 MG 24 hr capsule, Take 150 mg by mouth daily with breakfast., Disp: , Rfl:    vitamin B-12 (CYANOCOBALAMIN ) 1000 MCG tablet, Take 1,000 mcg by mouth daily. (Patient not taking: Reported on 04/12/2024), Disp: , Rfl:    zinc gluconate 50 MG tablet, Take 50 mg by mouth daily. (Patient not taking: Reported on 04/12/2024), Disp: , Rfl:

## 2024-05-11 NOTE — ED Triage Notes (Signed)
 Pt bib carelink from [place]. LKW was 2015. Patients family complained of left side weakness, left facial droop, and right gaze preference. Head CT from  showed possible stroke, pt received tn. CTA showed thrombus. Initial NIH of 11, carelink NIH of 2. axo 4.   Bp 130/60 NSR

## 2024-05-11 NOTE — ED Provider Notes (Signed)
 " [facility] AT [facility] Provider Note   CSN: [id number] Arrival date & time: 05/11/24  2048  An emergency department physician performed an initial assessment on this suspected stroke patient at 2050.  Patient presents with: Code Stroke   Amber Peterson is a 69 y.o. female.   69 year old female presents as a prehospital stroke alert.  Last known well was 9 PM.  Patient developed facial droop, some garbled speech and left arm and leg weakness per EMS report.  On arrival patient does have slurred speech and a facial droop as well as some gazing to the right but she does cross midline.  Can move her left leg but seems weak on that side and is not moving her left arm.  Telestroke at bedside to evaluate the patient.  History obtained entirely from EMS.        Prior to Admission medications  Medication Sig Start Date End Date Taking? Authorizing Provider  acetaminophen  (TYLENOL ) 500 MG tablet Take 2,000 mg by mouth as needed for moderate pain.    [provider]  albuterol  (VENTOLIN  HFA) 108 (90 Base) MCG/ACT inhaler Inhale 2 puffs into the lungs every 6 (six) hours as needed for wheezing or shortness of breath.    [provider]  alendronate (FOSAMAX) 70 MG tablet Take 70 mg by mouth once a week. 11/27/22   [provider]  ALPRAZolam  (XANAX ) 0.5 MG tablet Take 0.5 mg by mouth 2 (two) times daily. 07/13/19   [provider]  Ascorbic Acid (VITAMIN C PO) Take 1 tablet by mouth daily. Patient not taking: Reported on 04/12/2024    [provider]  atorvastatin  (LIPITOR ) 40 MG tablet Take 1 tablet (40 mg total) by mouth daily. 03/21/24   Anner Alm ORN, MD  Biotin 1000 MCG tablet Take 1,000 mcg by mouth daily. Patient not taking: Reported on 04/12/2024    [provider]  budesonide-formoterol (SYMBICORT) 160-4.5 MCG/ACT inhaler Inhale 2 puffs into the lungs 2 (two) times daily. Patient not taking: Reported on  04/12/2024    [provider]  Cholecalciferol (DIALYVITE VITAMIN D 5000 PO) Take 5,000 Units by mouth daily. Patient not taking: Reported on 04/12/2024    [provider]  clopidogrel  (PLAVIX ) 75 MG tablet Take 1 tablet (75 mg total) by mouth daily. Do not start until you finish taking Effient  03/21/24   Anner Alm ORN, MD  furosemide  (LASIX ) 20 MG tablet Take 2 tablets (40 mg) for 3 days, then take 1 tablet (20 mg) daily. 09/09/21   Anner Alm ORN, MD  HYDROcodone-acetaminophen  (NORCO/VICODIN) 5-325 MG tablet Take 1 tablet by mouth every 8 (eight) hours. 04/12/21   [provider]  hydrocortisone cream 1 % Apply 1 application  topically daily as needed for itching.    [provider]  levothyroxine  (SYNTHROID ) 25 MCG tablet Take 25 mcg by mouth daily. 10/22/22   [provider]  metoprolol  succinate (TOPROL -XL) 25 MG 24 hr tablet Take 1 tablet (25 mg total) by mouth daily with supper. 04/15/23   Anner Alm ORN, MD  nitroGLYCERIN  (NITROSTAT ) 0.4 MG SL tablet Place 1 tablet (0.4 mg total) under the tongue every 5 (five) minutes as needed for chest pain. 01/13/23   Anner Alm ORN, MD  sacubitril -valsartan  (ENTRESTO ) 24-26 MG Take 1 tablet by mouth 2 (two) times daily. 04/18/24   Anner Alm ORN, MD  spironolactone  (ALDACTONE ) 25 MG tablet Take 0.5 tablets (12.5 mg total) by mouth daily. 04/15/23  Anner Alm ORN, MD  Tetrahydrozoline HCl (VISINE OP) Place 1 drop into both eyes daily as needed (dry eyes).    [provider]  traZODone  (DESYREL ) 50 MG tablet Take 150 mg by mouth at bedtime.    [provider]  venlafaxine  XR (EFFEXOR -XR) 150 MG 24 hr capsule Take 150 mg by mouth daily with breakfast.    [provider]  vitamin B-12 (CYANOCOBALAMIN ) 1000 MCG tablet Take 1,000 mcg by mouth daily. Patient not taking: Reported on 04/12/2024    [provider]  zinc gluconate 50 MG tablet Take 50 mg by mouth daily. Patient  not taking: Reported on 04/12/2024    [provider]    Allergies: Shellfish allergy, Other, Penicillins, Sulfa antibiotics, and Tape    Review of Systems  Reason unable to perform ROS: Poor historian, acuity of condition.    Updated Vital Signs BP 128/73 (BP Location: Left Arm)   Pulse 72   Temp 98.1 F (36.7 C) (Oral)   Resp 16   Ht 4' 10 (1.473 m)   Wt 55.4 kg   SpO2 98%   BMI 25.52 kg/m   Physical Exam Vitals and nursing note reviewed.  Constitutional:      General: She is not in acute distress.    Appearance: She is well-developed.  HENT:     Head: Normocephalic and atraumatic.  Eyes:     Conjunctiva/sclera: Conjunctivae normal.  Cardiovascular:     Rate and Rhythm: Normal rate and regular rhythm.     Heart sounds: No murmur heard. Pulmonary:     Effort: Pulmonary effort is normal. No respiratory distress.     Breath sounds: Normal breath sounds.  Abdominal:     Palpations: Abdomen is soft.     Tenderness: There is no abdominal tenderness.  Musculoskeletal:        General: No swelling.     Cervical back: Neck supple.  Skin:    General: Skin is warm and dry.     Capillary Refill: Capillary refill takes less than 2 seconds.  Neurological:     Mental Status: She is alert.     Comments: Slightly slurred speech, facial droop, patient gazes to the right but does cross midline, muscle strength in left lower extremity is 3 out of 5 and 0 out of 5 in left upper extremity, full strength in right upper and lower extremity  Psychiatric:        Mood and Affect: Mood normal.     (all labs ordered are listed, but only abnormal results are displayed) Labs Reviewed  CBC - Abnormal; Notable for the following components:      Result Value   Hemoglobin 16.2 (*)    Platelets 131 (*)    All other components within normal limits  COMPREHENSIVE METABOLIC PANEL WITH GFR - Abnormal; Notable for the following components:   Glucose, Bld 154 (*)    Creatinine, Ser  1.05 (*)    Total Bilirubin 1.8 (*)    GFR, Estimated 57 (*)    All other components within normal limits  CBG MONITORING, ED - Abnormal; Notable for the following components:   Glucose-Capillary 155 (*)    All other components within normal limits  PROTIME-INR  APTT  DIFFERENTIAL  ETHANOL  URINE DRUG SCREEN  HIV ANTIBODY (ROUTINE TESTING W REFLEX)  LIPID PANEL  HEMOGLOBIN A1C  I-STAT CHEM 8, ED    EKG: None  Radiology: CT ANGIO HEAD NECK W WO CM (CODE STROKE)  Result Date: 05/11/2024 CLINICAL DATA:  Initial evaluation for acute neuro deficit, stroke suspected. EXAM: CT ANGIOGRAPHY HEAD AND NECK CT PERFUSION BRAIN TECHNIQUE: Multidetector CT imaging of the head and neck was performed using the standard protocol during bolus administration of intravenous contrast. Multiplanar CT image reconstructions and MIPs were obtained to evaluate the vascular anatomy. Carotid stenosis measurements (when applicable) are obtained utilizing NASCET criteria, using the distal internal carotid diameter as the denominator. Multiphase CT imaging of the brain was performed following IV bolus contrast injection. Subsequent parametric perfusion maps were calculated using RAPID software. RADIATION DOSE REDUCTION: This exam was performed according to the departmental dose-optimization program which includes automated exposure control, adjustment of the mA and/or kV according to patient size and/or use of iterative reconstruction technique. CONTRAST:  OMNIPAQUE  IOHEXOL  350 MG/ML SOLN COMPARISON:  Comparison made with head CT from earlier the same day. FINDINGS: CTA NECK FINDINGS Aortic arch: Aortic arch within normal limits for caliber with standard branch pattern. Mild aortic atherosclerosis. No stenosis about the origin the great vessels. Right carotid system: Right common and internal carotid arteries are patent without dissection. Moderate atheromatous change about the right carotid bulb/proximal cervical right  ICA without hemodynamically significant greater than 50% stenosis. Left carotid system: Left common and internal carotid arteries are patent without dissection. Moderate atheromatous change about the left carotid bulb without hemodynamically significant greater than 50% stenosis. Vertebral arteries: Both vertebral arteries arise from the subclavian arteries. Moderate atheromatous stenosis at the origin of the left vertebral artery. Vertebral arteries otherwise patent without stenosis or dissection. Skeleton: No worrisome osseous lesions. Reversal of the normal cervical lordosis with grade 1 anterolisthesis of C3 on C4 and C4 on C5. Moderate spondylosis at C5-6 and C6-7. Other neck: No other acute finding. Upper chest: No other acute finding. Review of the MIP images confirms the above findings CTA HEAD FINDINGS Anterior circulation: Atheromatous change about the carotid siphons bilaterally. Resultant moderate to severe stenosis at the para clinoid right ICA (series 6, image 127). No significant stenosis about the contralateral left siphon. A1 segments patent bilaterally. Normal anterior communicating complex. Anterior cerebral arteries patent without stenosis. Left M1 segment and distal left MCA branches are patent and well perfused. Focal severe stenosis versus short-segment subtotal occlusion of the mid-distal right M1 segment (series 8, image 110). A radiographic string sign is present. Right MCA and its branches remain patent and perfused distally. Posterior circulation: Both V4 segments patent without significant stenosis. Both PICA patent. Basilar patent without stenosis. Superior cerebral arteries patent bilaterally. Both PCAs primarily supplied via the basilar. Multifocal moderate stenoses about the right P1/P2 segments (series 12, image 21). Left PCA patent without significant stenosis. Patent allowing for timing the contrast bolus. Venous sinuses: None significant.  No aneurysm. Anatomic variants: 9 Review  of the MIP images confirms the above findings CT Brain Perfusion Findings: ASPECTS: 9 CBF (<30%) Volume: 37mL Perfusion (Tmax>6.0s) volume: 83mL Mismatch Volume: 46mL Infarction Location:Acute right MCA core infarct centered at the operculum. 46 mL surrounding ischemic penumbra, mismatch regio 2.2. IMPRESSION: 1. Focal severe stenosis versus short-segment subtotal occlusion of the mid-distal right M1 segment. A radiographic string sign is present. Right MCA and its branches remain patent and perfused distally. 2. Acute right MCA core infarct centered at the operculum, with 46 mL surrounding ischemic penumbra. 3. Atheromatous change about the right carotid siphon with associated moderate to severe stenosis at the para clinoid segment. 4. Moderate atheromatous stenosis at the origin of the left vertebral artery.  5. Multifocal moderate stenoses about the right P1/P2 segments. Aortic Atherosclerosis (ICD10-I70.0). Critical Value/emergent results were called by telephone at the time of interpretation on 05/11/2024 at 9:50 pm to provider Dr. Norlin, Who verbally acknowledged these results. Electronically Signed   By: Morene Hoard M.D.   On: 05/11/2024 22:04   CT CEREBRAL PERFUSION W CONTRAST Result Date: 05/11/2024 CLINICAL DATA:  Initial evaluation for acute neuro deficit, stroke suspected. EXAM: CT ANGIOGRAPHY HEAD AND NECK CT PERFUSION BRAIN TECHNIQUE: Multidetector CT imaging of the head and neck was performed using the standard protocol during bolus administration of intravenous contrast. Multiplanar CT image reconstructions and MIPs were obtained to evaluate the vascular anatomy. Carotid stenosis measurements (when applicable) are obtained utilizing NASCET criteria, using the distal internal carotid diameter as the denominator. Multiphase CT imaging of the brain was performed following IV bolus contrast injection. Subsequent parametric perfusion maps were calculated using RAPID software. RADIATION DOSE  REDUCTION: This exam was performed according to the departmental dose-optimization program which includes automated exposure control, adjustment of the mA and/or kV according to patient size and/or use of iterative reconstruction technique. CONTRAST:  OMNIPAQUE  IOHEXOL  350 MG/ML SOLN COMPARISON:  Comparison made with head CT from earlier the same day. FINDINGS: CTA NECK FINDINGS Aortic arch: Aortic arch within normal limits for caliber with standard branch pattern. Mild aortic atherosclerosis. No stenosis about the origin the great vessels. Right carotid system: Right common and internal carotid arteries are patent without dissection. Moderate atheromatous change about the right carotid bulb/proximal cervical right ICA without hemodynamically significant greater than 50% stenosis. Left carotid system: Left common and internal carotid arteries are patent without dissection. Moderate atheromatous change about the left carotid bulb without hemodynamically significant greater than 50% stenosis. Vertebral arteries: Both vertebral arteries arise from the subclavian arteries. Moderate atheromatous stenosis at the origin of the left vertebral artery. Vertebral arteries otherwise patent without stenosis or dissection. Skeleton: No worrisome osseous lesions. Reversal of the normal cervical lordosis with grade 1 anterolisthesis of C3 on C4 and C4 on C5. Moderate spondylosis at C5-6 and C6-7. Other neck: No other acute finding. Upper chest: No other acute finding. Review of the MIP images confirms the above findings CTA HEAD FINDINGS Anterior circulation: Atheromatous change about the carotid siphons bilaterally. Resultant moderate to severe stenosis at the para clinoid right ICA (series 6, image 127). No significant stenosis about the contralateral left siphon. A1 segments patent bilaterally. Normal anterior communicating complex. Anterior cerebral arteries patent without stenosis. Left M1 segment and distal left MCA  branches are patent and well perfused. Focal severe stenosis versus short-segment subtotal occlusion of the mid-distal right M1 segment (series 8, image 110). A radiographic string sign is present. Right MCA and its branches remain patent and perfused distally. Posterior circulation: Both V4 segments patent without significant stenosis. Both PICA patent. Basilar patent without stenosis. Superior cerebral arteries patent bilaterally. Both PCAs primarily supplied via the basilar. Multifocal moderate stenoses about the right P1/P2 segments (series 12, image 21). Left PCA patent without significant stenosis. Patent allowing for timing the contrast bolus. Venous sinuses: None significant.  No aneurysm. Anatomic variants: 9 Review of the MIP images confirms the above findings CT Brain Perfusion Findings: ASPECTS: 9 CBF (<30%) Volume: 37mL Perfusion (Tmax>6.0s) volume: 83mL Mismatch Volume: 46mL Infarction Location:Acute right MCA core infarct centered at the operculum. 46 mL surrounding ischemic penumbra, mismatch regio 2.2. IMPRESSION: 1. Focal severe stenosis versus short-segment subtotal occlusion of the mid-distal right M1 segment. A radiographic string sign  is present. Right MCA and its branches remain patent and perfused distally. 2. Acute right MCA core infarct centered at the operculum, with 46 mL surrounding ischemic penumbra. 3. Atheromatous change about the right carotid siphon with associated moderate to severe stenosis at the para clinoid segment. 4. Moderate atheromatous stenosis at the origin of the left vertebral artery. 5. Multifocal moderate stenoses about the right P1/P2 segments. Aortic Atherosclerosis (ICD10-I70.0). Critical Value/emergent results were called by telephone at the time of interpretation on 05/11/2024 at 9:50 pm to provider Dr. Norlin, Who verbally acknowledged these results. Electronically Signed   By: Morene Hoard M.D.   On: 05/11/2024 22:04   CT HEAD CODE STROKE WO CONTRAST  (LKW 0-4.5h, LVO 0-24h) Result Date: 05/11/2024 CLINICAL DATA:  Code stroke. Initial evaluation for acute neuro deficit, stroke suspected. EXAM: CT HEAD WITHOUT CONTRAST TECHNIQUE: Contiguous axial images were obtained from the base of the skull through the vertex without intravenous contrast. RADIATION DOSE REDUCTION: This exam was performed according to the departmental dose-optimization program which includes automated exposure control, adjustment of the mA and/or kV according to patient size and/or use of iterative reconstruction technique. COMPARISON:  Prior CT from 06/08/2020. FINDINGS: Brain: Cerebral volume within normal limits. No acute intracranial hemorrhage. Subtle loss of gray-white matter differentiation at the right lentiform nucleus, concerning for an acute right MCA territory infarct. Gray-white matter differentiation otherwise grossly maintained at this time. No mass lesion or midline shift. No hydrocephalus or extra-axial fluid collection. Vascular: Question asymmetric hyperdensity in the region of a proximal right M2 branch at the right sylvian fissure (series 3, image 11). Skull: Scalp soft tissues demonstrate no acute finding. Calvarium intact. Sinuses/Orbits: Right gaze preference noted. Visualized paranasal sinuses are clear. No significant mastoid effusion. Other: None. ASPECTS [facility] Stroke Program Early CT Score) - Ganglionic level infarction (caudate, lentiform nuclei, internal capsule, insula, M1-M3 cortex): 6 - Supraganglionic infarction (M4-M6 cortex): 3 Total score (0-10 with 10 being normal): 9 IMPRESSION: 1. Subtle loss of gray-white matter differentiation at the right lentiform nucleus, concerning for an acute right MCA territory infarct. No intracranial hemorrhage. 2. Aspects is 9. 3. Question asymmetric hyperdensity in the region of a proximal right M2 branch at the right Sylvian fissure, which could reflect thrombus. Correlation with dedicated CTA recommended. Results  communicated by telephone at the time of interpretation on 05/11/2024 at 9:20 pm to provider Dr. Norlin, who verbally acknowledged these results. Electronically Signed   By: Morene Hoard M.D.   On: 05/11/2024 21:24     .Critical Care  Performed by: Gennaro Duwaine CROME, DO Authorized by: Gennaro Duwaine CROME, DO   Critical care provider statement:    Critical care time (minutes):  45   Critical care was necessary to treat or prevent imminent or life-threatening deterioration of the following conditions:  CNS failure or compromise   Critical care was time spent personally by me on the following activities:  Development of treatment plan with patient or surrogate, discussions with consultants, evaluation of patient's response to treatment, examination of patient, ordering and review of laboratory studies, ordering and review of radiographic studies, ordering and performing treatments and interventions, pulse oximetry, re-evaluation of patient's condition, review of old charts and obtaining history from patient or surrogate   Care discussed with: admitting provider      Medications Ordered in the ED   stroke: early stages of recovery book (has no administration in time range)  0.9 %  sodium chloride  infusion (has no administration in time range)  acetaminophen  (TYLENOL ) tablet 650 mg (has no administration in time range)    Or  acetaminophen  (TYLENOL ) 160 MG/5ML solution 650 mg (has no administration in time range)    Or  acetaminophen  (TYLENOL ) suppository 650 mg (has no administration in time range)  senna-docusate (Senokot-S) tablet 1 tablet (has no administration in time range)  pantoprazole  (PROTONIX ) injection 40 mg (has no administration in time range)  tenecteplase  (TNKASE ) injection for stroke 14 mg (14 mg Intravenous Given 05/11/24 2128)  iohexol  (OMNIPAQUE ) 350 MG/ML injection 100 mL (100 mLs Intravenous Contrast Given 05/11/24 2144)                                    Medical  Decision Making Cardiac monitor interpretation: Sinus rhythm, no ectopy  Patient here for acute stroke.  Was given TNK and CT scanner without much improvement.  Had large right MCA stroke with left-sided deficits.  Neuroteam was immediately available and teleneuro at bedside on patient's arrival and I discussed the case further with them on the phone as below and they recommended transfer to [facility] for further stroke workup and they will plan for immediate IR intervention for thrombectomy.  I discussed all this with patient and family at bedside and they are agreeable with plan for transfer.    Problems Addressed: Acute CVA (cerebrovascular accident) [facility]): acute illness or injury that poses a threat to life or bodily functions  Amount and/or Complexity of Data Reviewed Independent Historian: EMS    Details: EMS provided history due to patient's acuity of condition.  Last known normal was around 9 PM, and patient has new facial droop slurred speech and weakness on the left side External Data Reviewed: notes.    Details: No prior ER records for review Labs: ordered. Decision-making details documented in ED Course.    Details: Ordered and unremarkable Radiology: ordered and independent interpretation performed. ECG/medicine tests: ordered and independent interpretation performed. Decision-making details documented in ED Course.    Details: Ordered and interpreted by me in the absence of cardiology and shows sinus rhythm, no STEMI, or significant change when compared to prior EKG Discussion of management or test interpretation with external provider(s): Dr. Neta, Dr. Vanessa -neurology-they gave TNK in the CT scanner and then recommended transfer to [facility] for stroke team workup, patient will also have thrombectomy there  Dr. Elisa -neuro IR-he was on the phone with neurology at night and he will plan for intervention, patient will be transferred to the ER at [facility]  Risk Prescription drug management. Drug therapy requiring intensive monitoring for toxicity. Decision regarding hospitalization. Minor surgery with identified risk factors. Risk Details: CRITICAL CARE Performed by: Duwaine LITTIE Fusi   Total critical care time: 45 minutes  Critical care time was exclusive of separately billable procedures and treating other patients.  Critical care was necessary to treat or prevent imminent or life-threatening deterioration.  Critical care was time spent personally by me on the following activities: development of treatment plan with patient and/or surrogate as well as nursing, discussions with consultants, evaluation of patient's response to treatment, examination of patient, obtaining history from patient or surrogate, ordering and performing treatments and interventions, ordering and review of laboratory studies, ordering and review of radiographic studies, pulse oximetry and re-evaluation of patient's condition.    Critical Care Total time providing critical care: 45 minutes    Final diagnoses:  Acute CVA (cerebrovascular  accident) [facility])    ED Discharge Orders     None          Gennaro Duwaine CROME, DO 05/11/24 2314  "

## 2024-05-11 NOTE — Consult Note (Signed)
 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 Risks/benefits of IV thrombolytics Tenecteplase  (TNK) were reviewed with patient. I quoted a 6% risk of symptomatic intracranial hemorrhage and a 3% risk of death from symptomatic intracranial hemorrhage. I did also discuss with the patient the alternative treatment option of loading with aspirin  +/- Plavix  (if patient passes bedside dysphagia screen to ensure that patient can swallow safely), which has a lower risk of symptomatic intracranial hemorrhage but that may not be as effective as the IV thrombolytic. Given the patients persistently severe and disabling symptoms, I recommended treatment with intravenous thrombolytic. After going through the risks and benefits of both options, patient wished to proceed with the intravenous thrombolytic; and patient provided informed consent for intravenous thrombolytic administration.    Discussed case with [facility] neurologist, Dr. Vanessa, and neurointerventionalist, Dr. Janjua. Reevaluation of patient at 2100 shows no significant improvement in neuro exam with ongoing disabling symptoms. As such, they are in agreement that patient may benefit from emergent transfer to [facility] for  consideration of endovascular treatment. ED physician, Dr. Gennaro was also on the call and is aware of the plan.    Plan: intravenous thrombolytic administration. Maintain BP <180/105 with BP checks per intravenous thrombolytic protocol and cardene drip as needed. If any new or worsening neuro symptoms, obtain a STAT repeat CT head to evaluate for hemorrhage/hemorrhagic transformation. Patient will need to be admitted to ICU for neuro checks per TNK protocol. No antiplatelets or anticoagulation for 24 hours post-thrombolytic. Will need repeat surveillance CT head done 24 hour post-intravenous thrombolytic to ensure no hemorrhagic transformation. Will need stroke workup with MRI brain, echocardiogram, tele monitoring, labs for risk factor stratification (lipid panel, HbA1c, TSH with free T4), PT/OT/speech eval.    Plan discussed with patient/family (daughter and patient's significant other), and ED team, including ED physician, who are all in agreement with plan. All questions were answered to the best of my ability.  Our recommendations are outlined below. Recommendations: IV Tenecteplase  recommended.  I confirmed the following. (Patient name, DOB, MRN, Blood Pressure, dose of Thrombolytic and waste, weight completed by stretcher/scale not stated weight, have ED staff inform ED MD of thrombolytic decision)  IV Tenecteplase  Total Dose - 13.8 mg (Dose Rounding Per Facility Protocol)   Routine post Thrombolytic monitoring including neuro checks and blood pressure control during/after treatment Monitor blood pressure Check blood pressure and neuro assessment every 15 min for 2 h, then every 30 min for 6 h, and finally every hour for 16 h.  Manage Blood Pressure per post Thrombolytic protocol.        Follow designated hospital protocol for admission and post  thrombolytic care       CT brain 24 hours post Thrombolytic       NPO until swallowing screen performed and passed       No antiplatelet  agents or anticoagulants (including heparin  for DVT prophylaxis) in first 24 hours       No Foley catheter, nasogastric tube, arterial catheter or central venous catheter for 24 hr, unless absolutely necessary       Telemetry       Bedside swallow evaluation       HOB less than 30 degrees       Euglycemia       Avoid hyperthermia, PRN acetaminophen        DVT prophylaxis       Inpatient Neurology Consultation       Stroke evaluation as per inpatient neurology recommendations  Discussed with ED physician    ------------------------------------------------------------------------------  Advanced Imaging: CTA Head and Neck Completed.  CTP Completed.  LVO:Yes  Discussed with NIR :Yes  Initial Call Time To NIR : 05/11/2024 20:44:00  NIR Accept/Decision Time : 05/11/2024 20:53:00  Discussed with NIR Time : 05/11/2024 21:09:00  Discussed with NIR Text : Discussed case with [facility] neurologist, Dr. Vanessa, and neurointerventionalist, Dr. Rosslyn. Reevaluation of patient at 2100 shows no significant improvement in neuro exam with ongoing disabling symptoms. As such, they are in agreement that patient may benefit from emergent transfer to [facility] for consideration of endovascular treatment. ED physician, Dr. Gennaro was also on the call and is aware of the plan. Discussed with patient and family who are aware and in agreement with plan.  Neurointerventionalist Accepted Case : Case accepted for intervention.  Metrics: Last Known Well: 05/11/2024 20:15:00 Arrival Time: 05/11/2024 20:48:00 Activation Time: 05/11/2024 20:46:50 Initial Response Time: 05/11/2024 20:49:30 ETA Time reported by hospital: 2 Minutes. Symptoms: slurred speech, left facial droop and left-sided weakness with unsteadiness. Initial patient interaction: 05/11/2024 20:53:13 NIHSS Assessment Completed: 05/11/2024 20:58:53 Patient is a candidate for Thrombolytic. Thrombolytic Medical  Decision: 05/11/2024 21:09:52 Needle Time: 05/11/2024 21:28:15 Weight Noted by Staff: 55.4 kg  CT Head: I personally reviewed all the CT images that were available to me and it showed: Imaging was personally reviewed. CTH is negative for bleed or early ischemic changes. There is an MCA dot sign on the right, which is concerning for a right M2 occlusion. CTA head/neck shows scattered atherosclerotic plaques with a subocclusive thrombus in the distal right M1 segment of the middle cerebral artery. CTP shows a large 83 cc perfusion deficit in the right hemisphere with a 37 cc core with a 46 cc penumbra (mismatch ratio 2.2).  Primary Provider Notified of Diagnostic Impression and Management Plan on: 05/11/2024 21:30:19    ------------------------------------------------------------------------------  Thrombolytic Contraindications:  Last Known Well > 4.5 hours: No CT Head showing hemorrhage: No Ischemic stroke within 3 months: No Severe head trauma within 3 months: No Intracranial/intraspinal surgery within 3 months: No History of intracranial hemorrhage: No Symptoms and signs consistent with an SAH: No GI malignancy or GI bleed within 21 days: No Coagulopathy: Platelets <100 000 /mm3, INR >1.7, aPTT>40 s, or PT >15 s: No Treatment dose of LMWH within the previous 24 hrs: No Use of NOACs in past 48 hours: No Glycoprotein IIb/IIIa receptor inhibitors use: No Symptoms consistent with infective endocarditis: No Suspected aortic arch dissection: No Intra-axial intracranial neoplasm: No  Thrombolytic Decision and Management Plan: Management with thrombolytic treatment was explained to the Patient as was  risks and benefits and alternatives to the treatment. Patient agrees with the decision to proceed with thrombolytic treatment. . All questions were answered and the Patient expressed understanding of the treatment plan.   History of Present Illness: Patient is a 69 year old  Female.  Patient was brought by EMS for symptoms of slurred speech, left facial droop and left-sided weakness with unsteadiness. 69 yo woman with a history of hypertension, hyperlipidemia, coronary artery disease, anterior ST-elevation myocardial infarction (11/2020) s/p percutaneous coronary intervention with coronary angioplasty and stenting, ischemic cardiomyopathy with improved ejection fraction to 55-60% (nadir 30-35% in 2022), who presents to the ED via EMS with slurred speech, left facial droop and left-sided weakness with unsteadiness. LKW/onset 2015. Per EMS, she appeared to have a right gaze preference with slurred speech and left-sided weakness.  vitals per EMS: blood pressure 123/69 with heart rate 89.  At baseline, patient ambulates independently without assistive device.  The patient is not on anticoagulation, no past intracranial hemorrhage or recent known stroke/MI in the past 3 months. No recent systemic bleeding diathesis or major surgery or major trauma.    Past Medical History:      Hypertension      Hyperlipidemia      Coronary Artery Disease      There is no history of Stroke Other PMH:  myocardial infarction (2022)  Medications:  No Anticoagulant use  Antiplatelet use: Yes plavix  75 mg daily Reviewed EMR for current medications  Allergies:  Reviewed  Social History: Smoking: No  Family History:  There is no family history of premature cerebrovascular disease pertinent to this consultation  ROS : 14 Points Review of Systems was performed and was negative except mentioned in HPI.  Past Surgical History: There Is No Surgical History Contributory To Todays Visit    Examination: BP(126/86), Pulse(90), Blood Glucose(155) 1A: Level of Consciousness - Alert; keenly responsive + 0 1B: Ask Month and Age - Both Questions Right + 0 1C: Blink Eyes & Squeeze Hands - Performs Both Tasks + 0 2: Test Horizontal Extraocular Movements - Normal + 0 3: Test Visual  Fields - No Visual Loss + 0 4: Test Facial Palsy (Use Grimace if Obtunded) - Partial paralysis (lower face) + 2 5A: Test Left Arm Motor Drift - Some Effort Against Gravity + 2 5B: Test Right Arm Motor Drift - No Drift for 10 Seconds + 0 6A: Test Left Leg Motor Drift - Some Effort Against Gravity + 2 6B: Test Right Leg Motor Drift - No Drift for 5 Seconds + 0 7: Test Limb Ataxia (FNF/Heel-Shin) - No Ataxia + 0 8: Test Sensation - Normal; No sensory loss + 0 9: Test Language/Aphasia - Normal; No aphasia + 0 10: Test Dysarthria - Mild-Moderate Dysarthria: Slurring but can be understood + 1 11: Test Extinction/Inattention - No abnormality + 0  NIHSS Score: 7 NIHSS Free Text : Patient is encephalopathic appearing with increased latency to respond. paucity of spontaneous speech but when prompted patient will respond in brief phrases with slow and mildly hypophonic speech but without dysarthria. Oriented to age and month. Able to follow some simple commands with prompting/mimic. Able to name objects and read grammatically complex sentences. Left facial droop. Eyes conjugate on primary gaze. Full EOM in the horizontal plane in both directions. No visual field cut to finger wiggle. Sensation intact to light touch in face and extremities bilaterally. Antigravity in the right upper/lower extremities without drift. Antigravity with drift in the left upper extremity. Antigravity  in the right lower extremity. Antigravity with drift in the left lower extremities.    On my repeat exam at 2100 (post-Tenecteplase : NIHSS 6 with just some mild improvement in the left upper extremity but still scoring 1 point for drift.      Pre-Morbid Modified Rankin Scale: 1 Points = No significant disability despite symptoms; able to carry out all usual duties and activities  Spoke with : Dr. Gennaro I reviewed the available imaging via Rapid and initiated discussion with the primary provider  This consult was conducted in  real time using interactive audio and video technology. Patient was informed of the technology being used for this visit and agreed to proceed. Patient located in hospital and provider located at home/office setting.   Patient is being evaluated for possible acute neurologic impairment and high probability of imminent or life-threatening deterioration. I spent total of 56 minutes providing care to this patient, including time for face to face visit via telemedicine, review of medical records, imaging studies and discussion of findings with providers, the patient and/or family.     Dr Genora Register   [facility] For Inpatient follow-up with [facility] physician please call [facility] at [phone]. As we are not an outpatient service for any post hospital discharge needs please contact the hospital for assistance.  If you have any questions for the [facility] physicians or need to reconsult for clinical or diagnostic changes please contact us  via [facility] at [phone].  Non-radiologist review of imaging performed to assist with emergent clinical decision-making. Remote physician workstations do not possess the same resolution, calibration, or diagnostic capabilities as hospital-based radiology reading stations, and formal radiologist read is necessary.   Signature : Kadeisha Betsch

## 2024-05-12 ENCOUNTER — Inpatient Hospital Stay (HOSPITAL_COMMUNITY)

## 2024-05-12 DIAGNOSIS — I6389 Other cerebral infarction: Secondary | ICD-10-CM | POA: Diagnosis not present

## 2024-05-12 DIAGNOSIS — E119 Type 2 diabetes mellitus without complications: Secondary | ICD-10-CM | POA: Diagnosis not present

## 2024-05-12 DIAGNOSIS — Z87891 Personal history of nicotine dependence: Secondary | ICD-10-CM | POA: Diagnosis not present

## 2024-05-12 DIAGNOSIS — I10 Essential (primary) hypertension: Secondary | ICD-10-CM | POA: Diagnosis not present

## 2024-05-12 DIAGNOSIS — R29701 NIHSS score 1: Secondary | ICD-10-CM | POA: Diagnosis not present

## 2024-05-12 DIAGNOSIS — E785 Hyperlipidemia, unspecified: Secondary | ICD-10-CM | POA: Diagnosis not present

## 2024-05-12 DIAGNOSIS — I63411 Cerebral infarction due to embolism of right middle cerebral artery: Secondary | ICD-10-CM | POA: Diagnosis not present

## 2024-05-12 LAB — ECHOCARDIOGRAM COMPLETE
AR max vel: 2.13 cm2
AV Area VTI: 2.18 cm2
AV Area mean vel: 2.23 cm2
AV Mean grad: 3 mmHg
AV Peak grad: 6.9 mmHg
Ao pk vel: 1.31 m/s
Area-P 1/2: 2.94 cm2
Calc EF: 62.5 %
Height: 58 in
MV VTI: 2.47 cm2
S' Lateral: 2.35 cm
Single Plane A2C EF: 63.4 %
Single Plane A4C EF: 61.5 %
Weight: 1953.6 [oz_av]

## 2024-05-12 LAB — LIPID PANEL
Cholesterol: 116 mg/dL (ref 0–200)
HDL: 57 mg/dL
LDL Cholesterol: 45 mg/dL (ref 0–99)
Total CHOL/HDL Ratio: 2 ratio
Triglycerides: 72 mg/dL
VLDL: 14 mg/dL (ref 0–40)

## 2024-05-12 LAB — GLUCOSE, CAPILLARY
Glucose-Capillary: 149 mg/dL — ABNORMAL HIGH (ref 70–99)
Glucose-Capillary: 147 mg/dL — ABNORMAL HIGH (ref 70–99)
Glucose-Capillary: 139 mg/dL — ABNORMAL HIGH (ref 70–99)
Glucose-Capillary: 107 mg/dL — ABNORMAL HIGH (ref 70–99)

## 2024-05-12 LAB — MRSA NEXT GEN BY PCR, NASAL: MRSA by PCR Next Gen: NOT DETECTED

## 2024-05-12 LAB — HIV ANTIBODY (ROUTINE TESTING W REFLEX): HIV Screen 4th Generation wRfx: NONREACTIVE

## 2024-05-12 LAB — HEMOGLOBIN A1C
Hgb A1c MFr Bld: 6.5 % — ABNORMAL HIGH (ref 4.8–5.6)
Mean Plasma Glucose: 139.85 mg/dL

## 2024-05-12 MED ORDER — INSULIN ASPART 100 UNIT/ML IJ SOLN
0.0000 [IU] | Freq: Three times a day (TID) | INTRAMUSCULAR | Status: DC
  Administered 2024-05-12 – 2024-05-13 (×3): 1 [IU] via SUBCUTANEOUS
  Administered 2024-05-13: 2 [IU] via SUBCUTANEOUS
  Filled 2024-05-12: qty 1
  Filled 2024-05-12: qty 2

## 2024-05-12 MED ORDER — VENLAFAXINE HCL ER 75 MG PO CP24
225.0000 mg | ORAL_CAPSULE | Freq: Every day | ORAL | Status: DC
  Administered 2024-05-13 – 2024-05-14 (×2): 225 mg via ORAL
  Filled 2024-05-12 (×2): qty 1

## 2024-05-12 MED ORDER — INSULIN ASPART 100 UNIT/ML IJ SOLN: 0.0000 [IU] | Freq: Every day | INTRAMUSCULAR | Status: DC

## 2024-05-12 MED ORDER — METOPROLOL TARTRATE 25 MG PO TABS
12.5000 mg | ORAL_TABLET | Freq: Two times a day (BID) | ORAL | Status: DC
  Administered 2024-05-12 – 2024-05-14 (×5): 12.5 mg via ORAL
  Filled 2024-05-12 (×5): qty 1

## 2024-05-12 MED ORDER — CHLORHEXIDINE GLUCONATE CLOTH 2 % EX PADS
6.0000 | MEDICATED_PAD | Freq: Every day | CUTANEOUS | Status: DC
  Administered 2024-05-12 – 2024-05-13 (×3): 6 via TOPICAL

## 2024-05-12 MED ORDER — ORAL CARE MOUTH RINSE
15.0000 mL | OROMUCOSAL | Status: DC | PRN
  Administered 2024-05-12 (×2): 15 mL via OROMUCOSAL

## 2024-05-12 MED ORDER — GADOBUTROL 1 MMOL/ML IV SOLN
8.0000 mL | Freq: Once | INTRAVENOUS | Status: AC | PRN
  Administered 2024-05-12: 8 mL via INTRAVENOUS

## 2024-05-12 MED ORDER — LEVOTHYROXINE SODIUM 50 MCG PO TABS
50.0000 ug | ORAL_TABLET | Freq: Every day | ORAL | Status: DC
  Administered 2024-05-13 – 2024-05-14 (×2): 50 ug via ORAL
  Filled 2024-05-12 (×2): qty 1

## 2024-05-12 MED ORDER — SODIUM CHLORIDE 0.9 % IV SOLN: INTRAVENOUS | Status: DC

## 2024-05-12 MED ORDER — TRAZODONE HCL 50 MG PO TABS
200.0000 mg | ORAL_TABLET | Freq: Every day | ORAL | Status: DC
  Administered 2024-05-12 – 2024-05-13 (×2): 200 mg via ORAL
  Filled 2024-05-12 (×2): qty 4

## 2024-05-12 MED ORDER — ASPIRIN 81 MG PO TBEC
81.0000 mg | DELAYED_RELEASE_TABLET | Freq: Every day | ORAL | Status: DC
  Administered 2024-05-12 – 2024-05-14 (×3): 81 mg via ORAL
  Filled 2024-05-12 (×3): qty 1

## 2024-05-12 MED ORDER — PERFLUTREN LIPID MICROSPHERE
1.0000 mL | INTRAVENOUS | Status: AC | PRN
  Administered 2024-05-12: 3 mL via INTRAVENOUS

## 2024-05-12 MED ORDER — ATORVASTATIN CALCIUM 40 MG PO TABS
40.0000 mg | ORAL_TABLET | Freq: Every day | ORAL | Status: DC
  Administered 2024-05-12 – 2024-05-14 (×3): 40 mg via ORAL
  Filled 2024-05-12 (×3): qty 1

## 2024-05-12 NOTE — Progress Notes (Addendum)
 STROKE TEAM PROGRESS NOTE    SIGNIFICANT HOSPITAL EVENTS  1/29: BIB EMS to [facility] or left-sided weakness and a right gaze deviation.  Symptoms concerning for a right MCA infarct, given TNKase . Found to have a subocclusive stenosis/thrombus in the right distal M1 with a core of 37 mL and a penumbra of 46 mL and a mismatch ratio of 2.2.  Post TNKase , there was no noted immediate improvement and therefore she was offered thrombectomy.  However, on arrival to [facility], she was noted to have had significant improvement in her symptoms with now complete resolution of the early and noted left-sided weakness and only a mild residual right gaze preference. NIH 1. Given significant improvement and only mild nondisabling deficits, thrombectomy was canceled.   INTERIM HISTORY/SUBJECTIVE  Patient sitting up in bed. Sister at bedside. RN at bedside.   Left gaze preference improved. No other focal deficits on exam.  Pending 24hour post-TNK imaging. Will remain in ICU for post-TNK monitoring. PT/OT, OOB as able. Home meds restarted.   OBJECTIVE    CBC    Component Value Date/Time   WBC 7.9 05/11/2024 2055   RBC 4.92 05/11/2024 2055   HGB 15.3 (H) 05/11/2024 2330   HGB 12.5 12/18/2020 1529   HCT 45.0 05/11/2024 2330   HCT 35.6 12/18/2020 1529   PLT 131 (L) 05/11/2024 2055   PLT 222 12/18/2020 1529   MCV 92.7 05/11/2024 2055   MCV 98 (H) 12/18/2020 1529   MCH 32.9 05/11/2024 2055   MCHC 35.5 05/11/2024 2055   RDW 12.4 05/11/2024 2055   RDW 13.5 12/18/2020 1529   LYMPHSABS 1.6 05/11/2024 2055   MONOABS 0.8 05/11/2024 2055   EOSABS 0.2 05/11/2024 2055   BASOSABS 0.0 05/11/2024 2055    BMET    Component Value Date/Time   NA 137 05/11/2024 2330   NA 141 09/23/2021 1521   K 5.9 (H) 05/11/2024 2330   CL 98 05/11/2024 2330   CO2 23 05/11/2024 2055   GLUCOSE 176 (H) 05/11/2024 2330   BUN 28 (H) 05/11/2024 2330   BUN 8 09/23/2021 1521   CREATININE 1.10 (H) 05/11/2024 2330   CREATININE  0.85 12/26/2018 1004   CALCIUM  9.6 05/11/2024 2055   EGFR 67 09/23/2021 1521   GFRNONAA 57 (L) 05/11/2024 2055   GFRNONAA 72 12/26/2018 1004    IMAGING past 24 hours CT ANGIO HEAD NECK W WO CM (CODE STROKE) Result Date: 05/11/2024 CLINICAL DATA:  Initial evaluation for acute neuro deficit, stroke suspected. EXAM: CT ANGIOGRAPHY HEAD AND NECK CT PERFUSION BRAIN TECHNIQUE: Multidetector CT imaging of the head and neck was performed using the standard protocol during bolus administration of intravenous contrast. Multiplanar CT image reconstructions and MIPs were obtained to evaluate the vascular anatomy. Carotid stenosis measurements (when applicable) are obtained utilizing NASCET criteria, using the distal internal carotid diameter as the denominator. Multiphase CT imaging of the brain was performed following IV bolus contrast injection. Subsequent parametric perfusion maps were calculated using RAPID software. RADIATION DOSE REDUCTION: This exam was performed according to the departmental dose-optimization program which includes automated exposure control, adjustment of the mA and/or kV according to patient size and/or use of iterative reconstruction technique. CONTRAST:  OMNIPAQUE  IOHEXOL  350 MG/ML SOLN COMPARISON:  Comparison made with head CT from earlier the same day. FINDINGS: CTA NECK FINDINGS Aortic arch: Aortic arch within normal limits for caliber with standard branch pattern. Mild aortic atherosclerosis. No stenosis about the origin the great vessels. Right carotid system: Right  common and internal carotid arteries are patent without dissection. Moderate atheromatous change about the right carotid bulb/proximal cervical right ICA without hemodynamically significant greater than 50% stenosis. Left carotid system: Left common and internal carotid arteries are patent without dissection. Moderate atheromatous change about the left carotid bulb without hemodynamically significant greater than 50%  stenosis. Vertebral arteries: Both vertebral arteries arise from the subclavian arteries. Moderate atheromatous stenosis at the origin of the left vertebral artery. Vertebral arteries otherwise patent without stenosis or dissection. Skeleton: No worrisome osseous lesions. Reversal of the normal cervical lordosis with grade 1 anterolisthesis of C3 on C4 and C4 on C5. Moderate spondylosis at C5-6 and C6-7. Other neck: No other acute finding. Upper chest: No other acute finding. Review of the MIP images confirms the above findings CTA HEAD FINDINGS Anterior circulation: Atheromatous change about the carotid siphons bilaterally. Resultant moderate to severe stenosis at the para clinoid right ICA (series 6, image 127). No significant stenosis about the contralateral left siphon. A1 segments patent bilaterally. Normal anterior communicating complex. Anterior cerebral arteries patent without stenosis. Left M1 segment and distal left MCA branches are patent and well perfused. Focal severe stenosis versus short-segment subtotal occlusion of the mid-distal right M1 segment (series 8, image 110). A radiographic string sign is present. Right MCA and its branches remain patent and perfused distally. Posterior circulation: Both V4 segments patent without significant stenosis. Both PICA patent. Basilar patent without stenosis. Superior cerebral arteries patent bilaterally. Both PCAs primarily supplied via the basilar. Multifocal moderate stenoses about the right P1/P2 segments (series 12, image 21). Left PCA patent without significant stenosis. Patent allowing for timing the contrast bolus. Venous sinuses: None significant.  No aneurysm. Anatomic variants: 9 Review of the MIP images confirms the above findings CT Brain Perfusion Findings: ASPECTS: 9 CBF (<30%) Volume: 37mL Perfusion (Tmax>6.0s) volume: 83mL Mismatch Volume: 46mL Infarction Location:Acute right MCA core infarct centered at the operculum. 46 mL surrounding ischemic  penumbra, mismatch regio 2.2. IMPRESSION: 1. Focal severe stenosis versus short-segment subtotal occlusion of the mid-distal right M1 segment. A radiographic string sign is present. Right MCA and its branches remain patent and perfused distally. 2. Acute right MCA core infarct centered at the operculum, with 46 mL surrounding ischemic penumbra. 3. Atheromatous change about the right carotid siphon with associated moderate to severe stenosis at the para clinoid segment. 4. Moderate atheromatous stenosis at the origin of the left vertebral artery. 5. Multifocal moderate stenoses about the right P1/P2 segments. Aortic Atherosclerosis (ICD10-I70.0). Critical Value/emergent results were called by telephone at the time of interpretation on 05/11/2024 at 9:50 pm to provider Dr. Norlin, Who verbally acknowledged these results. Electronically Signed   By: Morene Hoard M.D.   On: 05/11/2024 22:04   CT CEREBRAL PERFUSION W CONTRAST Result Date: 05/11/2024 CLINICAL DATA:  Initial evaluation for acute neuro deficit, stroke suspected. EXAM: CT ANGIOGRAPHY HEAD AND NECK CT PERFUSION BRAIN TECHNIQUE: Multidetector CT imaging of the head and neck was performed using the standard protocol during bolus administration of intravenous contrast. Multiplanar CT image reconstructions and MIPs were obtained to evaluate the vascular anatomy. Carotid stenosis measurements (when applicable) are obtained utilizing NASCET criteria, using the distal internal carotid diameter as the denominator. Multiphase CT imaging of the brain was performed following IV bolus contrast injection. Subsequent parametric perfusion maps were calculated using RAPID software. RADIATION DOSE REDUCTION: This exam was performed according to the departmental dose-optimization program which includes automated exposure control, adjustment of the mA and/or kV according to patient  size and/or use of iterative reconstruction technique. CONTRAST:  OMNIPAQUE   IOHEXOL  350 MG/ML SOLN COMPARISON:  Comparison made with head CT from earlier the same day. FINDINGS: CTA NECK FINDINGS Aortic arch: Aortic arch within normal limits for caliber with standard branch pattern. Mild aortic atherosclerosis. No stenosis about the origin the great vessels. Right carotid system: Right common and internal carotid arteries are patent without dissection. Moderate atheromatous change about the right carotid bulb/proximal cervical right ICA without hemodynamically significant greater than 50% stenosis. Left carotid system: Left common and internal carotid arteries are patent without dissection. Moderate atheromatous change about the left carotid bulb without hemodynamically significant greater than 50% stenosis. Vertebral arteries: Both vertebral arteries arise from the subclavian arteries. Moderate atheromatous stenosis at the origin of the left vertebral artery. Vertebral arteries otherwise patent without stenosis or dissection. Skeleton: No worrisome osseous lesions. Reversal of the normal cervical lordosis with grade 1 anterolisthesis of C3 on C4 and C4 on C5. Moderate spondylosis at C5-6 and C6-7. Other neck: No other acute finding. Upper chest: No other acute finding. Review of the MIP images confirms the above findings CTA HEAD FINDINGS Anterior circulation: Atheromatous change about the carotid siphons bilaterally. Resultant moderate to severe stenosis at the para clinoid right ICA (series 6, image 127). No significant stenosis about the contralateral left siphon. A1 segments patent bilaterally. Normal anterior communicating complex. Anterior cerebral arteries patent without stenosis. Left M1 segment and distal left MCA branches are patent and well perfused. Focal severe stenosis versus short-segment subtotal occlusion of the mid-distal right M1 segment (series 8, image 110). A radiographic string sign is present. Right MCA and its branches remain patent and perfused distally. Posterior  circulation: Both V4 segments patent without significant stenosis. Both PICA patent. Basilar patent without stenosis. Superior cerebral arteries patent bilaterally. Both PCAs primarily supplied via the basilar. Multifocal moderate stenoses about the right P1/P2 segments (series 12, image 21). Left PCA patent without significant stenosis. Patent allowing for timing the contrast bolus. Venous sinuses: None significant.  No aneurysm. Anatomic variants: 9 Review of the MIP images confirms the above findings CT Brain Perfusion Findings: ASPECTS: 9 CBF (<30%) Volume: 37mL Perfusion (Tmax>6.0s) volume: 83mL Mismatch Volume: 46mL Infarction Location:Acute right MCA core infarct centered at the operculum. 46 mL surrounding ischemic penumbra, mismatch regio 2.2. IMPRESSION: 1. Focal severe stenosis versus short-segment subtotal occlusion of the mid-distal right M1 segment. A radiographic string sign is present. Right MCA and its branches remain patent and perfused distally. 2. Acute right MCA core infarct centered at the operculum, with 46 mL surrounding ischemic penumbra. 3. Atheromatous change about the right carotid siphon with associated moderate to severe stenosis at the para clinoid segment. 4. Moderate atheromatous stenosis at the origin of the left vertebral artery. 5. Multifocal moderate stenoses about the right P1/P2 segments. Aortic Atherosclerosis (ICD10-I70.0). Critical Value/emergent results were called by telephone at the time of interpretation on 05/11/2024 at 9:50 pm to provider Dr. Norlin, Who verbally acknowledged these results. Electronically Signed   By: Morene Hoard M.D.   On: 05/11/2024 22:04   CT HEAD CODE STROKE WO CONTRAST (LKW 0-4.5h, LVO 0-24h) Result Date: 05/11/2024 CLINICAL DATA:  Code stroke. Initial evaluation for acute neuro deficit, stroke suspected. EXAM: CT HEAD WITHOUT CONTRAST TECHNIQUE: Contiguous axial images were obtained from the base of the skull through the vertex without  intravenous contrast. RADIATION DOSE REDUCTION: This exam was performed according to the departmental dose-optimization program which includes automated exposure control, adjustment of the  mA and/or kV according to patient size and/or use of iterative reconstruction technique. COMPARISON:  Prior CT from 06/08/2020. FINDINGS: Brain: Cerebral volume within normal limits. No acute intracranial hemorrhage. Subtle loss of gray-white matter differentiation at the right lentiform nucleus, concerning for an acute right MCA territory infarct. Gray-white matter differentiation otherwise grossly maintained at this time. No mass lesion or midline shift. No hydrocephalus or extra-axial fluid collection. Vascular: Question asymmetric hyperdensity in the region of a proximal right M2 branch at the right sylvian fissure (series 3, image 11). Skull: Scalp soft tissues demonstrate no acute finding. Calvarium intact. Sinuses/Orbits: Right gaze preference noted. Visualized paranasal sinuses are clear. No significant mastoid effusion. Other: None. ASPECTS [facility] Stroke Program Early CT Score) - Ganglionic level infarction (caudate, lentiform nuclei, internal capsule, insula, M1-M3 cortex): 6 - Supraganglionic infarction (M4-M6 cortex): 3 Total score (0-10 with 10 being normal): 9 IMPRESSION: 1. Subtle loss of gray-white matter differentiation at the right lentiform nucleus, concerning for an acute right MCA territory infarct. No intracranial hemorrhage. 2. Aspects is 9. 3. Question asymmetric hyperdensity in the region of a proximal right M2 branch at the right Sylvian fissure, which could reflect thrombus. Correlation with dedicated CTA recommended. Results communicated by telephone at the time of interpretation on 05/11/2024 at 9:20 pm to provider Dr. Norlin, who verbally acknowledged these results. Electronically Signed   By: Morene Hoard M.D.   On: 05/11/2024 21:24    Vitals:   05/12/24 0630 05/12/24 0700 05/12/24 0714  05/12/24 0742  BP: 134/77 (!) 192/151 (!) 150/83   Pulse: 74 75    Resp: 16 18    Temp:    98.1 F (36.7 C)  TempSrc:    Oral  SpO2: 96% 98%    Weight:      Height:         PHYSICAL EXAM General:  Alert, well-nourished, well-developed patient in no acute distress Psych:  Mood and affect appropriate for situation CV: Regular rate and rhythm on monitor Respiratory:  Regular, unlabored respirations on room air GI: Abdomen soft and nontender   NEURO:  Mental Status: AA&Ox3, patient is able to give clear and coherent history Speech/Language: speech is without dysarthria or aphasia.  Naming, repetition, fluency, and comprehension intact.  Cranial Nerves:  II: PERRL. Visual fields full.  III, IV, VI: Left gaze preference, but improved, does consistently cross midline.  V: Sensation is intact to light touch and symmetrical to face.  VII: Face is symmetrical resting and smiling VIII: hearing intact to voice. IX, X: Palate elevates symmetrically. Phonation is normal.  KP:Dynloizm shrug 5/5. XII: tongue is midline without fasciculations. Motor: 5/5 strength to all muscle groups tested.  Tone: is normal and bulk is normal Sensation- Intact to light touch bilaterally. Extinction absent to light touch to DSS.   Coordination: FTN intact bilaterally, HKS: no ataxia in BLE.No drift.  Gait- deferred  Most Recent NIH 1   ASSESSMENT/PLAN  Ms. Amber Peterson is a 69 y.o. female  with hx of hepatitis C, hypertension, hyperlipidemia, CAD, ischemic cardiomyopathy who initially presented to [facility] ED by [facility] for left-sided weakness and a right gaze deviation.  Symptoms concerning for a right MCA infarct.  She was offered and given TNKase  at [facility] ED.  She was found to have a subocclusive stenosis/thrombus in the right distal M1 with a core of 37 mL and a penumbra of 46 mL and a mismatch ratio of 2.2.  Post TNKase , there  was no noted immediate improvement  and therefore she was offered thrombectomy.  However, upon arrival to [facility], ED, she was noted to have had significant improvement in her symptoms with now complete resolution of the early and noted left-sided weakness and only a mild residual right gaze preference.  Her NIH stroke scale is a 1.  Given significant improvement and only mild nondisabling deficits, thrombectomy was canceled. NIH on Admission 1  Acute Ischemic Infarct:  right MCA territory s/p TNK Etiology:  embolic  Code Stroke CT head   Subtle loss of gray-white matter differentiation at the right lentiform nucleus, concerning for an acute right MCA territory infarct. No intracranial hemorrhage. Aspects is 9. CTA head & neck  Focal severe stenosis versus short-segment subtotal occlusion of the mid-distal right M1 segment. A radiographic string sign is present. Right MCA and its branches remain patent and perfused distally. Acute right MCA core infarct centered at the operculum, with 46 mL surrounding ischemic penumbra. Atheromatous change about the right carotid siphon with associated moderate to severe stenosis at the para clinoid segment. Moderate atheromatous stenosis at the origin of the left vertebral artery. Multifocal moderate stenoses about the right P1/P2 segments. CT perfusion  Penumbra of 46 mL, core of 37 mL. Mismatch ratio of 2.2.  MRI: Pending, scheduled for 24hours post-TNK Cardiac MRI: PENDING 2D Echo: EF 60-65%, Akinesis of apical anterior wall ? clot LDL 45 HgbA1c 6.5 VTE prophylaxis - SCDs.  clopidogrel  75 mg daily prior to admission, now on No antithrombotic pending 24hour post-TNK imaging.  Therapy recommendations:  Outpatient PT/OT/ST Disposition:  pending  Hx of STEMI 2022 S/p DES  Hypertension CAD Home Meds: Plavix , Lipitor , Toprol -XL, Spirinolactone, furosemide  Continue telemetry monitoring Begin anticoagulation with--HOLD pending 24hour post-TNK imaging  Blood Pressure Goal: BP less than  180/105   Hyperlipidemia Home meds:  Lipitor  40mg , continue LDL 45, goal < 70 Continue statin at discharge  Diabetes type II, Pre-Diabetic Home meds:  none HgbA1c 6.5, goal < 7.0 CBGs SSI Recommend close follow-up with PCP for better DM control  Tobacco Abuse Former cigarette smoker   Hospital day # 1   Pt seen by Neuro NP/APP with MD. Note/plan to be edited by MD as needed.    Amber JAYSON Likes, DNP [facility] Please use AMION for contact information & EPIC for messaging.  I have personally obtained history,examined this patient, reviewed notes, independently viewed imaging studies, participated in medical decision making and plan of care.ROS completed by me personally and pertinent positives fully documented  I have made any additions or clarifications directly to the above note. Agree with note above.  Patient presented with sudden onset of left hemiaplasia and right gaze deviation and received IV TNK and is doing much better..  Continue close neurological observation and strict blood pressure control as per post TNK protocol.  Mobilize out of bed.  Therapy consults.  Continue ongoing stroke workup.  2D echo shows LV apex of sinuses versus clot and will evaluate with this further by checking cardiac MRI.  Long discussion patient and her sister and answered questions. This patient is critically ill and at significant risk of neurological worsening, death and care requires constant monitoring of vital signs, hemodynamics,respiratory and cardiac monitoring, extensive review of multiple databases, frequent neurological assessment, discussion with family, other specialists and medical decision making of high complexity.I have made any additions or clarifications directly to the above note.This critical care time does not reflect procedure time, or teaching time or supervisory time  of PA/NP/Med Resident etc but could involve care discussion time.  I spent 30 minutes of  neurocritical care time  in the care of  this patient.      Eather Popp, MD Medical Director [facility] Stroke Center Pager: [phone] 05/12/2024 2:44 PM   To contact Stroke Continuity provider, please refer to [email or web address]. After hours, contact General Neurology

## 2024-05-12 NOTE — TOC Initial Note (Signed)
 Transition of Care [facility]) - Initial/Assessment Note    Patient Details  Name: Amber Peterson MRN: [id number] Date of Birth: 11-02-1955  Transition of Care [facility] CM/SW Contact:    Marites Nath M, RN Phone Number: 05/12/2024, 5:22 PM  Clinical Narrative:                 69 yo F adm 05/11/24 with fall, Rt gaze, Lt weakness, Rt MCA occlusion s/p TNK. PMhx: HTn, HLD, CAD, hep C, ICM PTA, pt independent and driving; lives with significant other.  Family able to provide 24h assistance at dc.  PT and OT recommending OP follow up; will refer to [facility] for follow up.            Expected Discharge Plan: OP Rehab Barriers to Discharge: Continued Medical Work up   Patient Goals and CMS Choice            Expected Discharge Plan and Services   Discharge Planning Services: CM Consult   Living arrangements for the past 2 months: Single Family Home                                      Prior Living Arrangements/Services Living arrangements for the past 2 months: Single Family Home Lives with:: Spouse Patient language and need for interpreter reviewed:: Yes        Need for Family Participation in Patient Care: Yes (Comment) Care giver support system in place?: Yes (comment)   Criminal Activity/Legal Involvement Pertinent to Current Situation/Hospitalization: No - Comment as needed  Activities of Daily Living   ADL Screening (condition at time of admission) Independently performs ADLs?: No Does the patient have a NEW difficulty with bathing/dressing/toileting/self-feeding that is expected to last >3 days?: Yes (Initiates electronic notice to provider for possible OT consult) Does the patient have a NEW difficulty with getting in/out of bed, walking, or climbing stairs that is expected to last >3 days?: Yes (Initiates electronic notice to provider for possible PT consult) Does the patient have a NEW difficulty with communication that is expected to last >3 days?:  Yes (Initiates electronic notice to provider for possible SLP consult) Is the patient deaf or have difficulty hearing?: No Does the patient have difficulty seeing, even when wearing glasses/contacts?: No Does the patient have difficulty concentrating, remembering, or making decisions?: No   Affect (typically observed): Accepting Orientation: : Oriented to Self, Oriented to Place, Oriented to  Time, Oriented to Situation      Admission diagnosis:  Acute right MCA stroke (HCC) [I63.511] Acute CVA (cerebrovascular accident) [facility]) [I63.9] Patient Active Problem List   Diagnosis Date Noted   Acute right MCA stroke (HCC) 05/11/2024   Orthostatic hypotension 01/18/2023   DM (diabetes mellitus), type 2 with complications (HCC) 01/18/2023   Preoperative clearance 02/01/2022   Atherosclerosis of aorta 04/24/2021   Shortness of breath 04/24/2021   Ischemic cardiomyopathy 12/10/2020   Essential hypertension 12/09/2020   Presence of drug coated stent in LAD coronary artery 12/09/2020   Hyperlipidemia with target low density lipoprotein (LDL) cholesterol less than 55 mg/dL 91/70/7977   GAD (generalized anxiety disorder) - with depression 12/09/2020   ST elevation myocardial infarction (STEMI) of anterior wall, subsequent episode of care (HCC) 12/08/2020   2 Vessel CAD: s/p DES PCI of LAD & LCx-OM2 12/08/2020   Glenoid fracture of shoulder, right, closed, initial encounter 03/22/2019   Colon cancer  screening 12/26/2018   Hepatitis C 07/22/2017   Asthma 07/22/2017   PCP:  Shona Norleen PEDLAR, MD Pharmacy:   [facility] [phone] - [place], [place] - [address] AT [facility] [address] [place] [address] Phone: [phone] Fax: [phone]  [facility] - [place], [place] - [address]  [address]  [address] [place] [address] Phone: [phone] Fax: [phone]     Social Drivers of Health (SDOH) Social History: SDOH Screenings   Depression (PHQ2-9): Medium Risk (06/09/2021)   Tobacco Use: Medium Risk (04/12/2024)   SDOH Interventions:     Readmission Risk Interventions     No data to display         Mliss MICAEL Fass, RN, BSN  Trauma/Neuro ICU Case Manager [phone]

## 2024-05-12 NOTE — Evaluation (Signed)
 Physical Therapy Evaluation Patient Details Name: Amber Peterson MRN: [id number] DOB: Nov 22, 1955 Today's Date: 05/12/2024  History of Present Illness  69 yo F adm 05/11/24 with fall, Rt gaze, Lt weakness, Rt MCA occlusion s/p TNK. PMhx: HTn, HLD, CAD, hep C, ICM  Clinical Impression  Pt pleasant and reporting she is LHD and always looks to the right at baseline which sister present denies. Pt with maintained Rt gaze, left inattention, mild LUE weakness and decreased problem solving/awareness. Pt lives with significant other who can provide supervision/assist at D/C. Gattis is able to walk with assist for cognition and wayfinding with assist for attention to left and moved visitor chair to pt left to increase attention to left. Pt will benefit from acute therapy to maximize mobility, safety and balance and OPPT recommended.         If plan is discharge home, recommend the following: A little help with walking and/or transfers;A little help with bathing/dressing/bathroom;Assistance with cooking/housework;Assist for transportation;Supervision due to cognitive status   Can travel by private vehicle        Equipment Recommendations None recommended by PT  Recommendations for Other Services  OT consult    Functional Status Assessment Patient has had a recent decline in their functional status and demonstrates the ability to make significant improvements in function in a reasonable and predictable amount of time.     Precautions / Restrictions Precautions Precautions: Other (comment) Recall of Precautions/Restrictions: Impaired Precaution/Restrictions Comments: left inattention, Rt gaze      Mobility  Bed Mobility Overal bed mobility: Modified Independent                  Transfers Overall transfer level: Modified independent                      Ambulation/Gait Ambulation/Gait assistance: Supervision Gait Distance (Feet): 300 Feet Assistive device: None Gait  Pattern/deviations: WFL(Within Functional Limits)   Gait velocity interpretation: >2.62 ft/sec, indicative of community ambulatory   General Gait Details: pt with steady gait able to dual task during gait but unable to wayfind without cues due to left inattention and room numbers on left, cues for obstacles to left  Stairs            Wheelchair Mobility     Tilt Bed    Modified Rankin (Stroke Patients Only) Modified Rankin (Stroke Patients Only) Pre-Morbid Rankin Score: No symptoms Modified Rankin: Moderately severe disability     Balance Overall balance assessment: Mild deficits observed, not formally tested                                           Pertinent Vitals/Pain Pain Assessment Pain Assessment: No/denies pain    Home Living Family/patient expects to be discharged to:: Private residence Living Arrangements: Spouse/significant other Available Help at Discharge: Family;Available 24 hours/day Type of Home: House Home Access: Stairs to enter   [employer] of Steps: 3   Home Layout: One level Home Equipment: None      Prior Function Prior Level of Function : Independent/Modified Independent;Driving                     Extremity/Trunk Assessment   Upper Extremity Assessment Upper Extremity Assessment: Defer to OT evaluation    Lower Extremity Assessment Lower Extremity Assessment: Overall WFL for tasks assessed    Cervical /  Trunk Assessment Cervical / Trunk Assessment: Normal  Communication   Communication Communication: No apparent difficulties    Cognition Arousal: Alert Behavior During Therapy: WFL for tasks assessed/performed   PT - Cognitive impairments: Safety/Judgement, Problem solving, Awareness                       PT - Cognition Comments: left inattention, Rt gaze, difficulty with wayfinding Following commands: Intact       Cueing Cueing Techniques: Verbal cues     General  Comments      Exercises     Assessment/Plan    PT Assessment Patient needs continued PT services  PT Problem List Decreased activity tolerance;Decreased mobility;Decreased cognition       PT Treatment Interventions Gait training;Stair training;Functional mobility training;Cognitive remediation;Therapeutic activities;Patient/family education;Neuromuscular re-education    PT Goals (Current goals can be found in the Care Plan section)  Acute Rehab PT Goals Patient Stated Goal: return home PT Goal Formulation: With patient/family Time For Goal Achievement: 05/26/24 Potential to Achieve Goals: Good    Frequency Min 2X/week     Co-evaluation               AM-PAC PT 6 Clicks Mobility  Outcome Measure Help needed turning from your back to your side while in a flat bed without using bedrails?: None Help needed moving from lying on your back to sitting on the side of a flat bed without using bedrails?: None Help needed moving to and from a bed to a chair (including a wheelchair)?: None Help needed standing up from a chair using your arms (e.g., wheelchair or bedside chair)?: A Little Help needed to walk in hospital room?: A Little Help needed climbing 3-5 steps with a railing? : A Little 6 Click Score: 21    End of Session Equipment Utilized During Treatment: Gait belt Activity Tolerance: Patient tolerated treatment well Patient left: in chair;with call bell/phone within reach;with family/visitor present;with chair alarm set Nurse Communication: Mobility status PT Visit Diagnosis: Other abnormalities of gait and mobility (R26.89);Difficulty in walking, not elsewhere classified (R26.2)    Time: 8980-8966 PT Time Calculation (min) (ACUTE ONLY): 14 min   Charges:   PT Evaluation $PT Eval Moderate Complexity: 1 Mod   PT General Charges $$ ACUTE PT VISIT: 1 Visit         Lenoard SQUIBB, PT Acute Rehabilitation Services Office: [phone]   Nykolas Bacallao B  Alverto Shedd 05/12/2024, 11:01 AM

## 2024-05-12 NOTE — Evaluation (Signed)
 Occupational Therapy Evaluation Patient Details Name: Amber Peterson MRN: [id number] DOB: 04-10-1956 Today's Date: 05/12/2024   History of Present Illness   69 yo F adm 05/11/24 with fall, Rt gaze, Lt weakness, Rt MCA occlusion s/p TNK. PMhx: HTn, HLD, CAD, hep C, ICM     Clinical Impressions Pt seen for OT evaluation this AM, agreeable for visit. Sister at bedside. PTA, pt lived with her boyfriend and was fully independent, + driving, managed her own meds via pillbox. Sister confirmed pt will have 24/7 assistance upon d/c between herself, boyfriend, and pt's daughter. Pt today presented with intact BUE strength, sensation, coordination. Notable R gaze preference and L inattention. Continues to look to R side even when MD and OT are speaking to pt from the L. Cognitive deficits related to general insight, awareness, and problem solving. Functionally, she was supervision for LB ADLs and SBA during UB ADLs with min sequencing cues. Observed pt brushing teeth with R hand (note: pt is LHD), poor problem solving how to dispense paper towels, disorganized grooming environment. However, she does locate all grooming supplies on L countertop. Strongly recommend against driving given visual perceptual deficits. VSS.  Pt is currently functioning below baseline and would benefit from ongoing acute OT services to progress towards safe discharge and to facilitate return to prior level of function. Current recommendation is home with outpatient OT and initial 24/7 supervision & assistance as detailed below.     If plan is discharge home, recommend the following:   Assistance with cooking/housework;Direct supervision/assist for medications management;Direct supervision/assist for financial management;Assist for transportation     Functional Status Assessment   Patient has had a recent decline in their functional status and demonstrates the ability to make significant improvements in function in a reasonable  and predictable amount of time.     Equipment Recommendations   None recommended by OT     Recommendations for Other Services         Precautions/Restrictions   Precautions Precautions: Fall Recall of Precautions/Restrictions: Impaired Precaution/Restrictions Comments: L inattention, R gaze pref Restrictions Weight Bearing Restrictions Per Provider Order: No     Mobility Bed Mobility Overal bed mobility: Needs Assistance Bed Mobility: Supine to Sit, Sit to Supine     Supine to sit: Supervision Sit to supine: Supervision   General bed mobility comments: incr effort/time to come to EOB, reports due to weak core; cued pt through log roll and able to sit up easier (suspect due to pt's short body mechanics and inflated ICU mattress)    Transfers Overall transfer level: Needs assistance   Transfers: Sit to/from Stand Sit to Stand: Supervision                  Balance Overall balance assessment: Mild deficits observed, not formally tested                                         ADL either performed or assessed with clinical judgement   ADL Overall ADL's : Needs assistance/impaired Eating/Feeding: Independent   Grooming: Contact guard assist;Cueing for sequencing;Standing Grooming Details (indicate cue type and reason): sinkside; pt using R hand to brush teeth (even though she is LHD), poor problem solving how to dispense paper towels from dispenser             Lower Body Dressing: Set up;Sitting/lateral leans Lower Body Dressing Details (indicate cue  type and reason): adjusting B socks via figure four method without LOB Toilet Transfer: Supervision/safety;Ambulation;Regular Toilet;Grab bars   Toileting- Clothing Manipulation and Hygiene: Supervision/safety;Sitting/lateral lean;Sit to/from stand Toileting - Clothing Manipulation Details (indicate cue type and reason): performed peri hygiene on toilet after voiding     Functional  mobility during ADLs: Supervision/safety;Contact guard assist       Vision Baseline Vision/History: 1 Wears glasses (has Rx glasses but reports her vision is unchanged without them; wears readers) Ability to See in Adequate Light: 0 Adequate Patient Visual Report: No change from baseline       Perception Perception: Impaired Preception Impairment Details: Inattention/Neglect Perception-Other Comments: L inattention   Praxis         Pertinent Vitals/Pain Pain Assessment Pain Assessment: No/denies pain     Extremity/Trunk Assessment Upper Extremity Assessment Upper Extremity Assessment: Overall WFL for tasks assessed;Left hand dominant   Lower Extremity Assessment Lower Extremity Assessment: Defer to PT evaluation   Cervical / Trunk Assessment Cervical / Trunk Assessment: Normal   Communication Communication Communication: No apparent difficulties   Cognition Arousal: Alert Behavior During Therapy: WFL for tasks assessed/performed Cognition: Cognition impaired   Orientation impairments:  (AOX4) Awareness: Online awareness impaired   Attention impairment (select first level of impairment): Sustained attention Executive functioning impairment (select all impairments): Reasoning, Problem solving, Organization OT - Cognition Comments: reduced insight into deficits, pt with poor problem solving skills evident during ADLs; not aware she had head turned to the R when MD and OT speaking to her from the L; very tangential                 Following commands: Intact       Cueing  General Comments   Cueing Techniques: Verbal cues  supportive sister, Baker, present   Exercises     Shoulder Instructions      Home Living Family/patient expects to be discharged to:: Private residence Living Arrangements: Spouse/significant other Available Help at Discharge: Family;Available 24 hours/day Type of Home: House Home Access: Stairs to enter [employer] of  Steps: 3   Home Layout: One level     Bathroom Shower/Tub: Producer, Television/film/video: Standard     Home Equipment: None          Prior Functioning/Environment Prior Level of Function : Independent/Modified Independent;Driving             Mobility Comments: no AD PTA ADLs Comments: pt reports she cares for her 2 cats, does not enjoy cooking, uses a pillbox to manage her medications    OT Problem List: Impaired vision/perception;Decreased cognition;Decreased safety awareness;Pain   OT Treatment/Interventions: Self-care/ADL training;Therapeutic activities;Cognitive remediation/compensation;Visual/perceptual remediation/compensation;Patient/family education      OT Goals(Current goals can be found in the care plan section)   Acute Rehab OT Goals Patient Stated Goal: go home and do outpatient therapy OT Goal Formulation: With patient/family Time For Goal Achievement: 05/26/24 Potential to Achieve Goals: Good   OT Frequency:  Min 2X/week    Co-evaluation              AM-PAC OT 6 Clicks Daily Activity     Outcome Measure Help from another person eating meals?: None Help from another person taking care of personal grooming?: A Little Help from another person toileting, which includes using toliet, bedpan, or urinal?: None Help from another person bathing (including washing, rinsing, drying)?: A Little Help from another person to put on and taking off regular upper body clothing?: None Help  from another person to put on and taking off regular lower body clothing?: None 6 Click Score: 22   End of Session Equipment Utilized During Treatment: Gait belt Nurse Communication: Mobility status  Activity Tolerance: Patient tolerated treatment well Patient left: in bed;with call bell/phone within reach;with nursing/sitter in room;with family/visitor present  OT Visit Diagnosis: Unsteadiness on feet (R26.81);Other abnormalities of gait and mobility (R26.89)                 Time: 8864-8785 OT Time Calculation (min): 39 min Charges:  OT General Charges $OT Visit: 1 Visit OT Evaluation $OT Eval Moderate Complexity: 1 Mod OT Treatments $Self Care/Home Management : 8-22 mins  Magaly Pollina M. [place], OTR/L [facility] Acute Rehabilitation Services [phone] Secure Chat Preferred  Daryan Buell 05/12/2024, 2:41 PM

## 2024-05-12 NOTE — Progress Notes (Signed)
 OT Cancellation Note  Patient Details Name: Amber Peterson MRN: [id number] DOB: Sep 13, 1955   Cancelled Treatment:    Reason Eval/Treat Not Completed: Active bedrest order (Pt received TNK 1/29 at 2128, will hold until > 24 hrs post-TNK per departmental protocol.)   Keria Widrig M. [place], OTR/L [facility] Acute Rehabilitation Services [phone] Secure Chat Preferred  [name] 05/12/2024, 7:35 AM

## 2024-05-12 NOTE — Progress Notes (Signed)
 PT Cancellation Note  Patient Details Name: Amber Peterson MRN: [id number] DOB: 01-20-1956   Cancelled Treatment:    Reason Eval/Treat Not Completed: Active bedrest order   Lenoard NOVAK Icholas Irby 05/12/2024, 7:38 AM Lenoard SQUIBB, PT Acute Rehabilitation Services Office: [phone]

## 2024-05-13 ENCOUNTER — Other Ambulatory Visit: Payer: Self-pay

## 2024-05-13 DIAGNOSIS — E785 Hyperlipidemia, unspecified: Secondary | ICD-10-CM | POA: Diagnosis not present

## 2024-05-13 DIAGNOSIS — I779 Disorder of arteries and arterioles, unspecified: Secondary | ICD-10-CM | POA: Diagnosis not present

## 2024-05-13 DIAGNOSIS — I63511 Cerebral infarction due to unspecified occlusion or stenosis of right middle cerebral artery: Secondary | ICD-10-CM

## 2024-05-13 DIAGNOSIS — R233 Spontaneous ecchymoses: Secondary | ICD-10-CM | POA: Diagnosis not present

## 2024-05-13 DIAGNOSIS — Z87891 Personal history of nicotine dependence: Secondary | ICD-10-CM | POA: Diagnosis not present

## 2024-05-13 DIAGNOSIS — Z7902 Long term (current) use of antithrombotics/antiplatelets: Secondary | ICD-10-CM | POA: Diagnosis not present

## 2024-05-13 DIAGNOSIS — R7303 Prediabetes: Secondary | ICD-10-CM

## 2024-05-13 DIAGNOSIS — I10 Essential (primary) hypertension: Secondary | ICD-10-CM | POA: Diagnosis not present

## 2024-05-13 DIAGNOSIS — R29701 NIHSS score 1: Secondary | ICD-10-CM | POA: Diagnosis not present

## 2024-05-13 LAB — BASIC METABOLIC PANEL WITH GFR
Anion gap: 12 (ref 5–15)
BUN: 16 mg/dL (ref 8–23)
CO2: 24 mmol/L (ref 22–32)
Calcium: 9.1 mg/dL (ref 8.9–10.3)
Chloride: 102 mmol/L (ref 98–111)
Creatinine, Ser: 0.79 mg/dL (ref 0.44–1.00)
GFR, Estimated: >60 mL/min
Glucose, Bld: 166 mg/dL — ABNORMAL HIGH (ref 70–99)
Potassium: 4.1 mmol/L (ref 3.5–5.1)
Sodium: 138 mmol/L (ref 135–145)

## 2024-05-13 LAB — CBC
HCT: 41.5 % (ref 36.0–46.0)
Hemoglobin: 15 g/dL (ref 12.0–15.0)
MCH: 33.8 pg (ref 26.0–34.0)
MCHC: 36.1 g/dL — ABNORMAL HIGH (ref 30.0–36.0)
MCV: 93.5 fL (ref 80.0–100.0)
Platelets: 103 10*3/uL — ABNORMAL LOW (ref 150–400)
RBC: 4.44 MIL/uL (ref 3.87–5.11)
RDW: 12.4 % (ref 11.5–15.5)
WBC: 9.2 10*3/uL (ref 4.0–10.5)
nRBC: 0 % (ref 0.0–0.2)

## 2024-05-13 LAB — GLUCOSE, CAPILLARY
Glucose-Capillary: 152 mg/dL — ABNORMAL HIGH (ref 70–99)
Glucose-Capillary: 123 mg/dL — ABNORMAL HIGH (ref 70–99)
Glucose-Capillary: 114 mg/dL — ABNORMAL HIGH (ref 70–99)
Glucose-Capillary: 117 mg/dL — ABNORMAL HIGH (ref 70–99)

## 2024-05-13 MED ORDER — TICAGRELOR 90 MG PO TABS
90.0000 mg | ORAL_TABLET | Freq: Two times a day (BID) | ORAL | Status: DC
  Administered 2024-05-13 – 2024-05-14 (×3): 90 mg via ORAL
  Filled 2024-05-13 (×3): qty 1

## 2024-05-13 MED ORDER — ENOXAPARIN SODIUM 40 MG/0.4ML IJ SOSY
40.0000 mg | PREFILLED_SYRINGE | INTRAMUSCULAR | Status: DC
  Administered 2024-05-13 – 2024-05-14 (×2): 40 mg via SUBCUTANEOUS
  Filled 2024-05-13 (×2): qty 0.4

## 2024-05-13 MED ORDER — SIMETHICONE 40 MG/0.6ML PO SUSP
40.0000 mg | Freq: Four times a day (QID) | ORAL | Status: DC | PRN
  Administered 2024-05-13: 40 mg via ORAL
  Filled 2024-05-13 (×2): qty 0.6

## 2024-05-13 MED ORDER — NAPHAZOLINE-PHENIRAMINE 0.025-0.3 % OP SOLN: 1.0000 [drp] | Freq: Every day | OPHTHALMIC | Status: DC | PRN

## 2024-05-13 NOTE — Discharge Instructions (Signed)

## 2024-05-13 NOTE — Care Management (Signed)
 SDOH resources added to AVS

## 2024-05-13 NOTE — Progress Notes (Addendum)
 STROKE TEAM PROGRESS NOTE    SIGNIFICANT HOSPITAL EVENTS  1/29: BIB EMS to [facility] or left-sided weakness and a right gaze deviation.  Symptoms concerning for a right MCA infarct, given TNKase . Found to have a subocclusive stenosis/thrombus in the right distal M1 with a core of 37 mL and a penumbra of 46 mL and a mismatch ratio of 2.2. offered thrombectomy.  When transferred to [facility], NIH 1. Given significant improvement and only mild nondisabling deficits, thrombectomy was canceled. 1/30: 24-hour post TNK imaging negative for hemorrhage.  Started on aspirin  and Plavix .  TTE showed? Clot. Cardiac MRI completed, no thrombus.  INTERIM HISTORY/SUBJECTIVE  Patient sitting up in bed.  No focal deficits on exam. Labs/vitals stable.   Will start patient on aspirin  and Brilinta  for 1 month, then back to Plavix  daily.  Will transfer out of ICU.  OBJECTIVE    CBC    Component Value Date/Time   WBC 9.2 05/13/2024 0426   RBC 4.44 05/13/2024 0426   HGB 15.0 05/13/2024 0426   HGB 12.5 12/18/2020 1529   HCT 41.5 05/13/2024 0426   HCT 35.6 12/18/2020 1529   PLT 103 (L) 05/13/2024 0426   PLT 222 12/18/2020 1529   MCV 93.5 05/13/2024 0426   MCV 98 (H) 12/18/2020 1529   MCH 33.8 05/13/2024 0426   MCHC 36.1 (H) 05/13/2024 0426   RDW 12.4 05/13/2024 0426   RDW 13.5 12/18/2020 1529   LYMPHSABS 1.6 05/11/2024 2055   MONOABS 0.8 05/11/2024 2055   EOSABS 0.2 05/11/2024 2055   BASOSABS 0.0 05/11/2024 2055    BMET    Component Value Date/Time   NA 138 05/13/2024 0426   NA 141 09/23/2021 1521   K 4.1 05/13/2024 0426   CL 102 05/13/2024 0426   CO2 24 05/13/2024 0426   GLUCOSE 166 (H) 05/13/2024 0426   BUN 16 05/13/2024 0426   BUN 8 09/23/2021 1521   CREATININE 0.79 05/13/2024 0426   CREATININE 0.85 12/26/2018 1004   CALCIUM  9.1 05/13/2024 0426   EGFR 67 09/23/2021 1521   GFRNONAA >60 05/13/2024 0426   GFRNONAA 72 12/26/2018 1004    IMAGING past 24 hours MR CARDIAC MORPHOLOGY W WO  CONTRAST Result Date: 05/12/2024 CLINICAL DATA:  55F with CAD, ICM p/w acute CVA. Echo concerning for possible apical thrombus. Anterior STEMI in 11/2020. EXAM: CARDIAC MRI TECHNIQUE: The patient was scanned on a 1.5 Tesla Siemens magnet. A dedicated cardiac coil was used. Functional imaging was done using Fiesta sequences. 2,3, and 4 chamber views were done to assess for RWMA's. Modified Simpson's rule using a short axis stack was used to calculate an ejection fraction on a dedicated work [employer]. The patient received 8 cc of Gadavist . After 10 minutes inversion recovery sequences were used to assess for infiltration and scar tissue. Phase contrast velocity mapping was performed above the aortic and pulmonic valves CONTRAST:  8 cc  of Gadavist  FINDINGS: Left ventricle: -Small size -No hypertrophy -Normal global systolic function.  Apical aneurysm -Normal ECV (26%) -Normal T2 values -Subendocardial LGE consistent with prior infarct in mid anteroseptal, apical anterior/septal walls, and apex. LGE is greater than 50% transmural suggesting nonviability in this region -No LV thrombus seen LV EF:  58% (Normal 52-79%) Absolute volumes: LV EDV: 70mL (Normal 78-167 mL) LV ESV: 29mL (Normal 21-64 mL) LV SV: 40mL (Normal 52-114 mL) CO: 3.0L/min (Normal 2.7-6.3 L/min) Indexed volumes: LV EDV: 19mL/sq-m (Normal 50-96 mL/sq-m) LV ESV: 32mL/sq-m (Normal 10-40 mL/sq-m) LV SV:  82mL/sq-m (Normal 33-64 mL/sq-m) CI: 2.0L/min/sq-m (Normal 1.9-3.9 L/min/sq-m) Right ventricle: Small size with normal systolic function RV EF: 68% (Normal 52-80%) Absolute volumes: RV EDV: 51mL (Normal 79-175 mL) RV ESV: 17mL (Normal 13-75 mL) RV SV: 35mL (Normal 56-110 mL) CO: 2.6L/min (Normal 2.7-6 L/min) Indexed volumes: RV EDV: 79mLsq-m (Normal 51-97 mL/sq-m) RV ESV: 51mL/sq-m (Normal 9-42 mL/sq-m) RV SV: 48mL/sq-m (Normal 35-61 mL/sq-m) CI: 1.7L/min/sq-m (Normal 1.8-3.8 L/min/sq-m) Left atrium: Normal size Right atrium: Normal size  Mitral valve: Trivial regurgitation Aortic valve: No regurgitation Tricuspid valve: Trivial regurgitation Pulmonic valve: No regurgitation Aorta: Normal proximal ascending aorta Pericardium: Normal IMPRESSION: 1.  No LV thrombus seen 2. Subendocardial LGE consistent with prior infarct in mid anteroseptal, apical anterior/septal walls, and apex. LGE is greater than 50% transmural suggesting nonviability in this region 3. Small LV size, no hypertrophy, and normal global systolic function (EF 58%). Apical aneurysm 4.  Small RV size with normal systolic function (EF 68%) Electronically Signed   By: Lonni Nanas M.D.   On: 05/12/2024 21:19   MR CARDIAC VELOCITY FLOW MAP Result Date: 05/12/2024 CLINICAL DATA:  89F with CAD, ICM p/w acute CVA. Echo concerning for possible apical thrombus. Anterior STEMI in 11/2020. EXAM: CARDIAC MRI TECHNIQUE: The patient was scanned on a 1.5 Tesla Siemens magnet. A dedicated cardiac coil was used. Functional imaging was done using Fiesta sequences. 2,3, and 4 chamber views were done to assess for RWMA's. Modified Simpson's rule using a short axis stack was used to calculate an ejection fraction on a dedicated work [employer]. The patient received 8 cc of Gadavist . After 10 minutes inversion recovery sequences were used to assess for infiltration and scar tissue. Phase contrast velocity mapping was performed above the aortic and pulmonic valves CONTRAST:  8 cc  of Gadavist  FINDINGS: Left ventricle: -Small size -No hypertrophy -Normal global systolic function.  Apical aneurysm -Normal ECV (26%) -Normal T2 values -Subendocardial LGE consistent with prior infarct in mid anteroseptal, apical anterior/septal walls, and apex. LGE is greater than 50% transmural suggesting nonviability in this region -No LV thrombus seen LV EF:  58% (Normal 52-79%) Absolute volumes: LV EDV: 70mL (Normal 78-167 mL) LV ESV: 29mL (Normal 21-64 mL) LV SV: 40mL (Normal 52-114 mL) CO:  3.0L/min (Normal 2.7-6.3 L/min) Indexed volumes: LV EDV: 57mL/sq-m (Normal 50-96 mL/sq-m) LV ESV: 37mL/sq-m (Normal 10-40 mL/sq-m) LV SV: 20mL/sq-m (Normal 33-64 mL/sq-m) CI: 2.0L/min/sq-m (Normal 1.9-3.9 L/min/sq-m) Right ventricle: Small size with normal systolic function RV EF: 68% (Normal 52-80%) Absolute volumes: RV EDV: 51mL (Normal 79-175 mL) RV ESV: 17mL (Normal 13-75 mL) RV SV: 35mL (Normal 56-110 mL) CO: 2.6L/min (Normal 2.7-6 L/min) Indexed volumes: RV EDV: 67mLsq-m (Normal 51-97 mL/sq-m) RV ESV: 81mL/sq-m (Normal 9-42 mL/sq-m) RV SV: 76mL/sq-m (Normal 35-61 mL/sq-m) CI: 1.7L/min/sq-m (Normal 1.8-3.8 L/min/sq-m) Left atrium: Normal size Right atrium: Normal size Mitral valve: Trivial regurgitation Aortic valve: No regurgitation Tricuspid valve: Trivial regurgitation Pulmonic valve: No regurgitation Aorta: Normal proximal ascending aorta Pericardium: Normal IMPRESSION: 1.  No LV thrombus seen 2. Subendocardial LGE consistent with prior infarct in mid anteroseptal, apical anterior/septal walls, and apex. LGE is greater than 50% transmural suggesting nonviability in this region 3. Small LV size, no hypertrophy, and normal global systolic function (EF 58%). Apical aneurysm 4.  Small RV size with normal systolic function (EF 68%) Electronically Signed   By: Lonni Nanas M.D.   On: 05/12/2024 21:19   MR CARDIAC VELOCITY FLOW MAP Result Date: 05/12/2024 CLINICAL DATA:  89F with CAD, ICM p/w  acute CVA. Echo concerning for possible apical thrombus. Anterior STEMI in 11/2020. EXAM: CARDIAC MRI TECHNIQUE: The patient was scanned on a 1.5 Tesla Siemens magnet. A dedicated cardiac coil was used. Functional imaging was done using Fiesta sequences. 2,3, and 4 chamber views were done to assess for RWMA's. Modified Simpson's rule using a short axis stack was used to calculate an ejection fraction on a dedicated work [employer]. The patient received 8 cc of Gadavist . After 10 minutes inversion  recovery sequences were used to assess for infiltration and scar tissue. Phase contrast velocity mapping was performed above the aortic and pulmonic valves CONTRAST:  8 cc  of Gadavist  FINDINGS: Left ventricle: -Small size -No hypertrophy -Normal global systolic function.  Apical aneurysm -Normal ECV (26%) -Normal T2 values -Subendocardial LGE consistent with prior infarct in mid anteroseptal, apical anterior/septal walls, and apex. LGE is greater than 50% transmural suggesting nonviability in this region -No LV thrombus seen LV EF:  58% (Normal 52-79%) Absolute volumes: LV EDV: 70mL (Normal 78-167 mL) LV ESV: 29mL (Normal 21-64 mL) LV SV: 40mL (Normal 52-114 mL) CO: 3.0L/min (Normal 2.7-6.3 L/min) Indexed volumes: LV EDV: 41mL/sq-m (Normal 50-96 mL/sq-m) LV ESV: 58mL/sq-m (Normal 10-40 mL/sq-m) LV SV: 66mL/sq-m (Normal 33-64 mL/sq-m) CI: 2.0L/min/sq-m (Normal 1.9-3.9 L/min/sq-m) Right ventricle: Small size with normal systolic function RV EF: 68% (Normal 52-80%) Absolute volumes: RV EDV: 51mL (Normal 79-175 mL) RV ESV: 17mL (Normal 13-75 mL) RV SV: 35mL (Normal 56-110 mL) CO: 2.6L/min (Normal 2.7-6 L/min) Indexed volumes: RV EDV: 54mLsq-m (Normal 51-97 mL/sq-m) RV ESV: 14mL/sq-m (Normal 9-42 mL/sq-m) RV SV: 40mL/sq-m (Normal 35-61 mL/sq-m) CI: 1.7L/min/sq-m (Normal 1.8-3.8 L/min/sq-m) Left atrium: Normal size Right atrium: Normal size Mitral valve: Trivial regurgitation Aortic valve: No regurgitation Tricuspid valve: Trivial regurgitation Pulmonic valve: No regurgitation Aorta: Normal proximal ascending aorta Pericardium: Normal IMPRESSION: 1.  No LV thrombus seen 2. Subendocardial LGE consistent with prior infarct in mid anteroseptal, apical anterior/septal walls, and apex. LGE is greater than 50% transmural suggesting nonviability in this region 3. Small LV size, no hypertrophy, and normal global systolic function (EF 58%). Apical aneurysm 4.  Small RV size with normal systolic function (EF 68%) Electronically  Signed   By: Lonni Nanas M.D.   On: 05/12/2024 21:19   MR BRAIN WO CONTRAST Result Date: 05/12/2024 EXAM: MRI BRAIN WITHOUT CONTRAST 05/12/2024 07:06:43 PM TECHNIQUE: Multiplanar multisequence MRI of the head/brain was performed without the administration of intravenous contrast. COMPARISON: Abnormal CT of the head. CLINICAL HISTORY: Neuro deficit, acute, stroke suspected. Acute neurologic deficit; suspected stroke. Acute onset of left sided weakness which near completely improved following administration of tnk. FINDINGS: BRAIN AND VENTRICLES: Acute subacute infarct of the right frontal operculum. Acute infarct is also noted in the right lentiform nucleus and caudate. A punctate focus of acute infarct is present in the right subinsular white matter. Petechial hemorrhage is present within the basal ganglia infarcts as well as the right frontal operculum infarct. Mass effect is present with effacement of the sulci and partial effacement of the right lateral ventricle. Periventricular white matter changes are present bilaterally separate from the infarct. No midline shift. No hydrocephalus. The sella is unremarkable. Normal flow voids. ORBITS: No significant abnormality. SINUSES AND MASTOIDS: No significant abnormality. BONES AND SOFT TISSUES: Normal marrow signal. No soft tissue abnormality. IMPRESSION: 1. Acute/subacute infarcts in the right frontal operculum, right lentiform nucleus, caudate, and right subinsular white matter, with associated petechial hemorrhage and mass effect. 2. Chronic bilateral periventricular white matter changes,  separate from the infarcts. Electronically signed by: Lonni Necessary MD 05/12/2024 07:30 PM EST RP Workstation: [id number]    Vitals:   05/13/24 0842 05/13/24 1000 05/13/24 1200 05/13/24 1235  BP:   (!) 110/99   Pulse: 73 81 67 68  Resp: 16 (!) 21 17 (!) 21  Temp: 98.9 F (37.2 C)   98.6 F (37 C)  TempSrc: Oral   Oral  SpO2: 94% 93% 95% 95%  Weight:       Height:       PHYSICAL EXAM General:  Alert, well-nourished, well-developed patient in no acute distress Psych:  Mood and affect appropriate for situation CV: Regular rate and rhythm on monitor Respiratory:  Regular, unlabored respirations on room air GI: Abdomen soft and nontender  NEURO:  Mental Status: AA&Ox3, patient is able to give clear and coherent history Speech/Language: speech is without dysarthria or aphasia.  Naming, repetition, fluency, and comprehension intact.  Cranial Nerves:  II: PERRL. Visual fields full.  III, IV, VI: EOMI. No preference.  V: Sensation is intact to light touch and symmetrical to face.  VII: Face is symmetrical resting and smiling VIII: hearing intact to voice. IX, X: Palate elevates symmetrically. Phonation is normal.  KP:Dynloizm shrug 5/5. XII: tongue is midline without fasciculations. Motor: 5/5 strength to all muscle groups tested.  Tone: is normal and bulk is normal Sensation- Intact to light touch bilaterally. Extinction absent to light touch to DSS.   Coordination: FTN intact bilaterally, HKS: no ataxia in BLE.No drift.  Gait- deferred  Most Recent NIH 1   ASSESSMENT/PLAN  Amber Peterson is a 69 y.o. female  with hx of hepatitis C, hypertension, hyperlipidemia, CAD, ischemic cardiomyopathy who initially presented to [facility] ED by [facility] for left-sided weakness and a right gaze deviation.  Symptoms concerning for a right MCA infarct.  She was offered and given TNKase  at [facility] ED.  She was found to have a subocclusive stenosis/thrombus in the right distal M1 with a core of 37 mL and a penumbra of 46 mL and a mismatch ratio of 2.2.  Post TNKase , there was no noted immediate improvement and therefore she was offered thrombectomy.  However, upon arrival to [facility], ED, she was noted to have had significant improvement in her symptoms with now complete resolution of the early and noted left-sided  weakness and only a mild residual right gaze preference.  Her NIH stroke scale is a 1.  Given significant improvement and only mild nondisabling deficits, thrombectomy was canceled. NIH on Admission 1  Stroke:  right MCA territory infarcts s/p TNK, etiology:  large vessel disease with R M1 severe stenosis   Code Stroke CT head  Subtle loss of gray-white matter differentiation at the right lentiform nucleus, concerning for an acute right MCA territory infarct. No intracranial hemorrhage. Aspects is 9. CTA head & neck Focal severe stenosis versus short-segment subtotal occlusion of the mid-distal right M1 segment. A radiographic string sign is present. Right MCA and its branches remain patent and perfused distally. Atheromatous change about the right carotid siphon with associated moderate to severe stenosis at the para clinoid segment. Moderate atheromatous stenosis at the origin of the left vertebral artery. Multifocal moderate stenoses about the right P1/P2 segments. CT perfusion Penumbra of 46 mL, core of 37 mL. Mismatch ratio of 2.2.  MRI: Acute/subacute infarcts in the right frontal operculum, right lentiform nucleus, caudate, and right subinsular white matter, with associated petechial hemorrhage and  mass effect.  2D Echo: EF 60-65%, Akinesis of apical anterior wall ? clot Cardiac MRI: No LV thrombus.  Subendocardial LGE consistent with prior infarct in mid anteroseptal, apical anterior/septal walls and apex.  Small LV size, no hypertrophy.  EF 58% LDL 45 HgbA1c 6.5 UDS pending VTE prophylaxis - lovenox  clopidogrel  75 mg daily prior to admission, now on aspirin  and Brilinta  for 1 month, then back to Plavix  daily. Pt declined CAPTIVA trial Therapy recommendations:  Outpatient PT/OT/ST Disposition:  pending  HTN Home Meds: Plavix , Lipitor , Toprol -XL, Spirinolactone, furosemide  Recommend close follow-up with cardiology for medication review, patient endorses dizzy spells and low BP.  BP stable  now Now on metoprolol  only  Avoid hypotension Long term BP goal 130-150 given left M1 string sign  Hyperlipidemia Home meds:  Lipitor  40mg , continue LDL 45, goal < 70 Continue statin at discharge  Pre-Diabetic Home meds:  none HgbA1c 6.5, goal < 7.0 CBGs SSI Recommend close follow-up with PCP for better DM control  Other stroke risk factors Former cigarette smoker Advanced age CAD/MI in 2022 s/p stenting Cardiomyopathy on GDMT home meds  Other medical issues Hep C  Hospital day # 2   Pt seen by Neuro NP/APP with MD. Note/plan to be edited by MD as needed.    Rocky JAYSON Likes, DNP [facility] Please use AMION for contact information & EPIC for messaging.  ATTENDING NOTE: I reviewed above note and agree with the assessment and plan. Pt was seen and examined.   No family at the bedside. Pt lying in bed, neuro intact on exam. BP on the low end, started home low dose metoprolol . Pt does have left M1 string sign, avoid low BP, recommend home BP monitoring. Only resumed home BP meds metoprolol . Close follow up with cardiology to adjust BP meds to avoid low BP at home. On ASA and brilinta  as well as lipitor  40. PT and OT recommend outpt.  For detailed assessment and plan, please refer to above as I have made changes wherever appropriate.   Ary Cummins, MD PhD Stroke Neurology 05/13/2024 11:40 PM  This patient is critically ill due to stroke with high grade left M1 stenosis, BP management and at significant risk of neurological worsening, death form recurrent stroke, hemorrhagic conversion, heart failure. This patient's care requires constant monitoring of vital signs, hemodynamics, respiratory and cardiac monitoring, review of multiple databases, neurological assessment, discussion with family, other specialists and medical decision making of high complexity. I spent 35 minutes of neurocritical care time in the care of this patient.

## 2024-05-13 NOTE — Evaluation (Signed)
 Speech Language Pathology Evaluation Patient Details Name: Amber Peterson MRN: [id number] DOB: 04-29-1955 Today's Date: 05/13/2024 Time: 1050-1100 SLP Time Calculation (min) (ACUTE ONLY): 10 min  Problem List:  Patient Active Problem List   Diagnosis Date Noted   Acute right MCA stroke (HCC) 05/11/2024   Orthostatic hypotension 01/18/2023   DM (diabetes mellitus), type 2 with complications (HCC) 01/18/2023   Preoperative clearance 02/01/2022   Atherosclerosis of aorta 04/24/2021   Shortness of breath 04/24/2021   Ischemic cardiomyopathy 12/10/2020   Essential hypertension 12/09/2020   Presence of drug coated stent in LAD coronary artery 12/09/2020   Hyperlipidemia with target low density lipoprotein (LDL) cholesterol less than 55 mg/dL 91/70/7977   GAD (generalized anxiety disorder) - with depression 12/09/2020   ST elevation myocardial infarction (STEMI) of anterior wall, subsequent episode of care (HCC) 12/08/2020   2 Vessel CAD: s/p DES PCI of LAD & LCx-OM2 12/08/2020   Glenoid fracture of shoulder, right, closed, initial encounter 03/22/2019   Colon cancer screening 12/26/2018   Hepatitis C 07/22/2017   Asthma 07/22/2017   Past Medical History:  Past Medical History:  Diagnosis Date   Acute ST elevation myocardial infarction (STEMI) due to occlusion of distal portion of left anterior descending (LAD) coronary artery (HCC) 12/08/2020   100 % prox-mid LAD - DES PCI (also staged PCI-PTCA of LCx-OM2 (stent)-AVGVCx (PTCA).  Initial EF 30-35% -> Improved to 55-60% on 4 month f/u Echo.   Anxiety    Arthritis    all major joint   Asthma 07/22/2017   Chronic headaches    Depression    Hepatitis C 07/22/2017   History of kidney stones    Hypertension    Ischemic cardiomyopathy - RESOLVED 12/10/2020   EF 30 to 35% with anterior, anterolateral and inferior hypokinesis to akinesis consistent with LAD infarct. -> 4 month Post-MI / PCI Echo - EF 55-60%.   ~2 Vessel CORONARY ARTERY  DISEASE -  LAD & LCx-OM2 12/08/2020   12/08/2020: 100 % mLAD - DES PCI Onyx Frontier DES 2.5 x 26; 80%-99% bifurcation LCx-OM/AVG Cx (Sttaged PCI)> DES PCI Prox-Mid Cx 25%->OM2 90% -> DES PCI ONYX FRONTIER 2.0 x 15 -> 2.3 mm; PTCA of AVG LCx (thrombotic 99%) -> 1.5 mm Balloon PTCA (through stent) - 20% residual.   Past Surgical History:  Past Surgical History:  Procedure Laterality Date   ABDOMINAL HYSTERECTOMY  1995   ANKLE FRACTURE SURGERY Left 01/26/2021   and leg   CORONARY BALLOON ANGIOPLASTY N/A 12/10/2020   Procedure: CORONARY BALLOON ANGIOPLASTY;  Surgeon: Burnard Debby LABOR, MD;  Location: [facility];  Service: Cardiovascular;; STAGED Bifurcation LCx-OM2-AVGCx: PTCA of AVG LCx (thrombotic 99%) -> 1.5 mm Balloon PTCA (through stent into OM2) - 20% residual.   CORONARY STENT INTERVENTION N/A 12/08/2020   Procedure: CORONARY STENT INTERVENTION;  Surgeon: Court Dorn PARAS, MD;  Location: [facility];  Service: Cardiovascular;; Prox-Mid LAD 100% (DES PCI ONYX FRONTIER 2.5 x 26 mm -2.6 mm   CORONARY STENT INTERVENTION N/A 12/10/2020   Procedure: CORONARY STENT INTERVENTION;  Surgeon: Burnard Debby LABOR, MD;  Location: [facility];  Service: Cardiovascular;; STAGED DES PCI Prox-Mid Cx 25%->OM2 90% -> DES PCI ONYX FRONTIER 2.0 x 15 -> 2.3 mm   CORONARY/GRAFT ACUTE MI REVASCULARIZATION N/A 12/08/2020   Procedure: Coronary/Graft Acute MI Revascularization;  Surgeon: Court Dorn PARAS, MD;  Location: [facility];  Service: Cardiovascular;  Laterality: N/A;   LEFT HEART CATH N/A 12/10/2020  Procedure: Left Heart Cath;  Surgeon: Burnard Debby LABOR, MD;  Location: [facility];  Service: Cardiovascular; Catheter spasm of LM on initial Guide placement. -> Smooth ~50% prox LAD (pre-stent), patent stent.  30% Ost LCx, 25% m LCx, Ost OM2 90% & thrombotic AVG LCx 99% LVEDP 13 mmHg   LEFT HEART CATH AND CORONARY ANGIOGRAPHY N/A 12/08/2020   Procedure: LEFT HEART CATH AND CORONARY  ANGIOGRAPHY;  Surgeon: Court Dorn PARAS, MD;  Location: [facility];  Service: Cardiovascular; (Ant STEMI): Prox RCA 30%. Short LM. Prox-mid LCx 80% (actually into OM2) & AVG LCVx 99%; Prox-Mid LAD 100%  (DES PCI LAD, Staged PCI of LCx)   REVERSE SHOULDER ARTHROPLASTY Right 03/22/2019   Procedure: REVERSE SHOULDER ARTHROPLASTY;  Surgeon: Cristy Bonner DASEN, MD;  Location: [facility];  Service: Orthopedics;  Laterality: Right;   TONSILLECTOMY  2000   TRANSTHORACIC ECHOCARDIOGRAM  12/09/2020   (Ant STEMI on 12/08/2020: EF 30 to 35%.  Moderate reduced EF.  Mid-apical anterior, anteroseptal and inferoseptal AK.  Also apical inferior, apical lateral and apical akinesis.  GR 1 DD.  Mild LA dilation.  Normal RV size, function and RVP, RAP.SABRA  Normal valves.   TRANSTHORACIC ECHOCARDIOGRAM  03/24/2021   (3 months post Anterior STEMI with LAD and LCx PCI w/ initial) EF 30-35%) : EF IMPROVED to 55-60%. LV with regional wall motion abnormalities. Apical septal segments and apex are akinetic. Mid segments are hypokinetic. Mild concentric LVH. Grade 1 DD.   HPI:  69 yo F adm 05/11/24 with fall, Rt gaze, Lt weakness, Rt MCA occlusion s/p TNK. PMhx: HTn, HLD, CAD, hep C, ICM   Assessment / Plan / Recommendation Clinical Impression  Pt is independent at baseline and has 24/7 supervision available at home. She exhibits significantly decreased awareness and attention with deficits also noted related to problem solving and reasoning. She keeps her head turned to the R but tracks past midline with cueing. Emerging awareness is noted at times but is inconsistent. Memory appears generally WFL but impaired attention is anticipated to affect this with functional tasks. Attention and pragmatics also affect participation in conversation with impairments related to topic appropriateness and maintenance in addition to turn taking, which is more consistent with a R hemisphere infarct. Recommend ongoing SLP f/u acutely and on an OP  basis to target these cognitive impairments.     SLP Assessment  SLP Recommendation/Assessment: Patient needs continued Speech Language Pathology Services SLP Visit Diagnosis: Cognitive communication deficit (R41.841)     Assistance Recommended at Discharge  Frequent or constant Supervision/Assistance  Functional Status Assessment Patient has had a recent decline in their functional status and demonstrates the ability to make significant improvements in function in a reasonable and predictable amount of time.  Frequency and Duration min 2x/week  2 weeks      SLP Evaluation Cognition  Overall Cognitive Status: Impaired/Different from baseline Arousal/Alertness: Awake/alert Orientation Level: Oriented X4 Attention: Sustained Sustained Attention: Impaired Sustained Attention Impairment: Verbal basic;Functional basic Memory: Appears intact Awareness: Impaired Awareness Impairment: Intellectual impairment Problem Solving: Impaired Problem Solving Impairment: Verbal basic;Functional basic Executive Function: Reasoning Reasoning: Impaired Reasoning Impairment: Verbal complex       Comprehension  Auditory Comprehension Overall Auditory Comprehension: Appears within functional limits for tasks assessed    Expression Expression Primary Mode of Expression: Verbal Verbal Expression Overall Verbal Expression: Impaired Initiation: No impairment Level of Generative/Spontaneous Verbalization: Conversation Repetition: No impairment Naming: No impairment Pragmatics: Impairment Impairments: Topic appropriateness;Topic maintenance;Turn Taking Written Expression  Dominant Hand: Left   Oral / Motor  Oral Motor/Sensory Function Overall Oral Motor/Sensory Function: Within functional limits Motor Speech Overall Motor Speech: Appears within functional limits for tasks assessed            Damien Blumenthal, M.A., CCC-SLP Speech Language Pathology, Acute Rehabilitation Services  Secure Chat  preferred [phone]  05/13/2024, 12:52 PM

## 2024-05-14 ENCOUNTER — Other Ambulatory Visit (HOSPITAL_COMMUNITY): Payer: Self-pay

## 2024-05-14 DIAGNOSIS — I63511 Cerebral infarction due to unspecified occlusion or stenosis of right middle cerebral artery: Secondary | ICD-10-CM | POA: Diagnosis not present

## 2024-05-14 DIAGNOSIS — R29700 NIHSS score 0: Secondary | ICD-10-CM

## 2024-05-14 LAB — BASIC METABOLIC PANEL WITH GFR
Anion gap: 14 (ref 5–15)
BUN: 20 mg/dL (ref 8–23)
CO2: 21 mmol/L — ABNORMAL LOW (ref 22–32)
Calcium: 9 mg/dL (ref 8.9–10.3)
Chloride: 101 mmol/L (ref 98–111)
Creatinine, Ser: 0.85 mg/dL (ref 0.44–1.00)
GFR, Estimated: >60 mL/min
Glucose, Bld: 110 mg/dL — ABNORMAL HIGH (ref 70–99)
Potassium: 3.6 mmol/L (ref 3.5–5.1)
Sodium: 136 mmol/L (ref 135–145)

## 2024-05-14 LAB — CBC
HCT: 40.7 % (ref 36.0–46.0)
Hemoglobin: 14.9 g/dL (ref 12.0–15.0)
MCH: 33.6 pg (ref 26.0–34.0)
MCHC: 36.6 g/dL — ABNORMAL HIGH (ref 30.0–36.0)
MCV: 91.7 fL (ref 80.0–100.0)
Platelets: 98 10*3/uL — ABNORMAL LOW (ref 150–400)
RBC: 4.44 MIL/uL (ref 3.87–5.11)
RDW: 12.4 % (ref 11.5–15.5)
WBC: 7.5 10*3/uL (ref 4.0–10.5)
nRBC: 0 % (ref 0.0–0.2)

## 2024-05-14 LAB — GLUCOSE, CAPILLARY
Glucose-Capillary: 118 mg/dL — ABNORMAL HIGH (ref 70–99)
Glucose-Capillary: 142 mg/dL — ABNORMAL HIGH (ref 70–99)

## 2024-05-14 MED ORDER — ATORVASTATIN CALCIUM 40 MG PO TABS
40.0000 mg | ORAL_TABLET | Freq: Every day | ORAL | 0 refills | Status: AC
  Filled 2024-05-14: qty 90, 90d supply, fill #0

## 2024-05-14 MED ORDER — SACUBITRIL-VALSARTAN 24-26 MG PO TABS
1.0000 | ORAL_TABLET | Freq: Two times a day (BID) | ORAL | 0 refills | Status: AC
  Filled 2024-05-14: qty 60, 30d supply, fill #0

## 2024-05-14 MED ORDER — TICAGRELOR 90 MG PO TABS
90.0000 mg | ORAL_TABLET | Freq: Two times a day (BID) | ORAL | 0 refills | Status: AC
  Filled 2024-05-14: qty 60, 30d supply, fill #0

## 2024-05-14 MED ORDER — METOPROLOL TARTRATE 25 MG PO TABS
12.5000 mg | ORAL_TABLET | Freq: Two times a day (BID) | ORAL | 0 refills | Status: AC
  Filled 2024-05-14: qty 60, 60d supply, fill #0

## 2024-05-14 MED ORDER — ASPIRIN 81 MG PO TBEC
81.0000 mg | DELAYED_RELEASE_TABLET | Freq: Every day | ORAL | 12 refills | Status: AC
  Filled 2024-05-14: qty 30, 30d supply, fill #0

## 2024-05-14 MED ORDER — CLOPIDOGREL BISULFATE 75 MG PO TABS
75.0000 mg | ORAL_TABLET | Freq: Every day | ORAL | 0 refills | Status: AC
  Filled 2024-05-14: qty 90, 90d supply, fill #0

## 2024-05-14 NOTE — Plan of Care (Signed)
 Alert and oriented.  NIH is 0.  Tolerating activity.  Tolerating diet.

## 2024-05-14 NOTE — TOC Transition Note (Signed)
 Transition of Care (TOC) - Discharge Note Rayfield Gobble RN, BSN Inpatient Care Management Unit 4E- RN Case Manager See Treatment Team for direct phone # Weekend cross coverage  Patient Details  Name: Amber Peterson MRN: [id number] Date of Birth: 12-13-1955  Transition [facility] Care [facility]) CM/SW Contact:  Gobble Rayfield Hurst, RN Phone Number: 05/14/2024, 12:05 PM   Clinical Narrative:    Pt stable for transition home today, Consult received for outpt neuro rehab-   Referral made to [facility] Neuro rehab via Epic for outpt PT/OT/SLP- ref# [id number]  Info on AVS and pt will follow up to schedule  No further ICM needs noted.    Final next level of care: OP Rehab Barriers to Discharge: Barriers Resolved   Patient Goals and CMS Choice Patient states their goals for this hospitalization and ongoing recovery are:: return home          Discharge Placement                 Home      Discharge Plan and Services Additional resources added to the After Visit Summary for     Discharge Planning Services: CM Consult Post Acute Care Choice: NA          DME Arranged: N/A DME Agency: NA       HH Arranged: NA HH Agency: NA        Social Drivers of Health (SDOH) Interventions SDOH Screenings   Food Insecurity: No Food Insecurity (05/13/2024)  Housing: Low Risk (05/13/2024)  Transportation Needs: No Transportation Needs (05/13/2024)  Utilities: Not At Risk (05/13/2024)  Depression (PHQ2-9): Medium Risk (06/09/2021)  Social Connections: Socially Isolated (05/13/2024)  Tobacco Use: Medium Risk (04/12/2024)     Readmission Risk Interventions    05/14/2024   12:05 PM  Readmission Risk Prevention Plan  Post Dischage Appt Complete  Medication Screening Complete  Transportation Screening Complete

## 2024-05-14 NOTE — Progress Notes (Signed)
 Dc instructions given at this time.  Pt verbalized understanding of all instructions.  No s/s of any acute distress.  Assisted pt via wc to private vehicle at this time.

## 2024-05-14 NOTE — Discharge Summary (Addendum)
 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 No Did it involve sudden or severe rash/hives, skin peeling, or any reaction on the inside of your mouth or nose? No Did you need to seek medical attention at a hospital or doctor's office? No When did it last happen?      69 years old If all above answers are NO, may proceed with cephalosporin use.   Sulfa Antibiotics Hives, Itching   Tape    Causes blisters         Medication List     STOP taking these medications    furosemide  20 MG tablet Commonly known as: LASIX    metoprolol  succinate 25 MG 24 hr tablet Commonly known as: TOPROL -XL   spironolactone  25 MG tablet Commonly known as: ALDACTONE        TAKE these medications    acetaminophen  500 MG tablet Commonly known as: TYLENOL  Take 1,000-2,000 mg by mouth daily as needed for moderate pain (pain score 4-6).   albuterol  108 (90 Base) MCG/ACT inhaler Commonly known as: VENTOLIN  HFA Inhale 2 puffs into the lungs every 6 (six) hours as needed for wheezing or shortness of breath.   alendronate 70 MG tablet Commonly known as: FOSAMAX Take 70 mg by mouth once a week.   ALPRAZolam  0.5 MG tablet Commonly known as: XANAX  Take 0.5 mg by mouth daily  as needed for anxiety.   Aspirin  Adult Low Strength 81 MG tablet Generic drug: aspirin  EC Take 1 tablet (81 mg total) by mouth daily. Swallow whole.   atorvastatin  40 MG tablet Commonly known as: LIPITOR  Take 1 tablet (40 mg total) by mouth daily.   budesonide-formoterol 160-4.5 MCG/ACT inhaler Commonly known as: SYMBICORT Inhale 2 puffs into the lungs 2 (two) times daily.   Cholecalciferol 100 MCG (4000 UT) Caps Take 4,000 Units by mouth daily.   clopidogrel  75 MG tablet Commonly known as: PLAVIX  Take 1 tablet (75 mg total) by mouth daily. Do not start until you finish taking Effient  Start taking on: June 11, 2024 What changed: These instructions start on June 11, 2024. If you are unsure what to do until then, ask your doctor or other care provider.   HYDROcodone-acetaminophen  5-325 MG tablet Commonly known as: NORCO/VICODIN Take 1 tablet by mouth 2 (two) times daily as needed for moderate pain (pain score 4-6).   hydrocortisone cream 1 % Apply 1 application  topically daily as needed for itching.   levothyroxine  50 MCG tablet Commonly known as: SYNTHROID  Take 50 mcg by mouth daily.   metoprolol  tartrate 25 MG tablet Commonly known as: LOPRESSOR  Take 0.5 tablets (12.5 mg total) by mouth 2 (two) times daily.   nitroGLYCERIN  0.4 MG SL tablet Commonly known as: Nitrostat  Place  1 tablet (0.4 mg total) under the tongue every 5 (five) minutes as needed for chest pain.   sacubitril -valsartan  24-26 MG Commonly known as: Entresto  Take 1 tablet by mouth 2 (two) times daily.   ticagrelor  90 MG Tabs tablet Commonly known as: Brilinta  Take 1 tablet (90 mg total) by mouth 2 (two) times daily.   traZODone  50 MG tablet Commonly known as: DESYREL  Take 200 mg by mouth at bedtime.   venlafaxine  XR 75 MG 24 hr capsule Commonly known as: EFFEXOR -XR Take 225 mg by mouth daily.   VISINE OP Place 1 drop into both eyes daily as needed (dry eyes).        LABORATORY  STUDIES CBC    Component Value Date/Time   WBC 7.5 05/14/2024 0331   RBC 4.44 05/14/2024 0331   HGB 14.9 05/14/2024 0331   HGB 12.5 12/18/2020 1529   HCT 40.7 05/14/2024 0331   HCT 35.6 12/18/2020 1529   PLT 98 (L) 05/14/2024 0331   PLT 222 12/18/2020 1529   MCV 91.7 05/14/2024 0331   MCV 98 (H) 12/18/2020 1529   MCH 33.6 05/14/2024 0331   MCHC 36.6 (H) 05/14/2024 0331   RDW 12.4 05/14/2024 0331   RDW 13.5 12/18/2020 1529   LYMPHSABS 1.6 05/11/2024 2055   MONOABS 0.8 05/11/2024 2055   EOSABS 0.2 05/11/2024 2055   BASOSABS 0.0 05/11/2024 2055   CMP    Component Value Date/Time   NA 136 05/14/2024 0331   NA 141 09/23/2021 1521   K 3.6 05/14/2024 0331   CL 101 05/14/2024 0331   CO2 21 (L) 05/14/2024 0331   GLUCOSE 110 (H) 05/14/2024 0331   BUN 20 05/14/2024 0331   BUN 8 09/23/2021 1521   CREATININE 0.85 05/14/2024 0331   CREATININE 0.85 12/26/2018 1004   CALCIUM  9.0 05/14/2024 0331   PROT 7.7 05/11/2024 2055   PROT 7.5 09/23/2021 1521   ALBUMIN 4.6 05/11/2024 2055   ALBUMIN 4.3 09/23/2021 1521   AST 31 05/11/2024 2055   ALT 32 05/11/2024 2055   ALKPHOS 61 05/11/2024 2055   BILITOT 1.8 (H) 05/11/2024 2055   BILITOT 1.1 09/23/2021 1521   GFRNONAA >60 05/14/2024 0331   GFRNONAA 72 12/26/2018 1004   GFRAA >60 03/15/2019 1132   GFRAA 84 12/26/2018 1004   COAGS Lab Results  Component Value Date   INR 1.1 05/11/2024   INR 1.1 12/08/2020   INR 1.1 07/22/2017   Lipid Panel    Component Value Date/Time   CHOL 116 05/12/2024 0525   CHOL 118 01/21/2021 1057   TRIG 72 05/12/2024 0525   HDL 57 05/12/2024 0525   HDL 53 01/21/2021 1057   CHOLHDL 2.0 05/12/2024 0525   VLDL 14 05/12/2024 0525   LDLCALC 45 05/12/2024 0525   LDLCALC 45 01/21/2021 1057   HgbA1C  Lab Results  Component Value Date   HGBA1C 6.5 (H) 05/11/2024   Urinalysis No results found for: COLORURINE, APPEARANCEUR, LABSPEC, PHURINE, GLUCOSEU, HGBUR, BILIRUBINUR, KETONESUR,  PROTEINUR, UROBILINOGEN, NITRITE, LEUKOCYTESUR Urine Drug Screen No results found for: LABOPIA, COCAINSCRNUR, LABBENZ, AMPHETMU, THCU, LABBARB  Alcohol Level    Component Value Date/Time   [facility] <15 05/11/2024 2055     SIGNIFICANT DIAGNOSTIC STUDIES MR CARDIAC MORPHOLOGY W WO CONTRAST Result Date: 05/12/2024 CLINICAL DATA:  1F with CAD, ICM p/w acute CVA. Echo concerning for possible apical thrombus. Anterior STEMI in 11/2020. EXAM: CARDIAC MRI TECHNIQUE: The patient was scanned on a 1.5 Tesla Siemens magnet. A dedicated cardiac coil was  used. Functional imaging was done using Fiesta sequences. 2,3, and 4 chamber views were done to assess for RWMA's. Modified Simpson's rule using a short axis stack was used to calculate an ejection fraction on a dedicated work [employer]. The patient received 8 cc of Gadavist . After 10 minutes inversion recovery sequences were used to assess for infiltration and scar tissue. Phase contrast velocity mapping was performed above the aortic and pulmonic valves CONTRAST:  8 cc  of Gadavist  FINDINGS: Left ventricle: -Small size -No hypertrophy -Normal global systolic function.  Apical aneurysm -Normal ECV (26%) -Normal T2 values -Subendocardial LGE consistent with prior infarct in mid anteroseptal, apical anterior/septal walls, and apex. LGE is greater than 50% transmural suggesting nonviability in this region -No LV thrombus seen LV EF:  58% (Normal 52-79%) Absolute volumes: LV EDV: 70mL (Normal 78-167 mL) LV ESV: 29mL (Normal 21-64 mL) LV SV: 40mL (Normal 52-114 mL) CO: 3.0L/min (Normal 2.7-6.3 L/min) Indexed volumes: LV EDV: 9mL/sq-m (Normal 50-96 mL/sq-m) LV ESV: 62mL/sq-m (Normal 10-40 mL/sq-m) LV SV: 78mL/sq-m (Normal 33-64 mL/sq-m) CI: 2.0L/min/sq-m (Normal 1.9-3.9 L/min/sq-m) Right ventricle: Small size with normal systolic function RV EF: 68% (Normal 52-80%) Absolute volumes: RV EDV: 51mL (Normal 79-175 mL) RV ESV: 17mL (Normal  13-75 mL) RV SV: 35mL (Normal 56-110 mL) CO: 2.6L/min (Normal 2.7-6 L/min) Indexed volumes: RV EDV: 65mLsq-m (Normal 51-97 mL/sq-m) RV ESV: 84mL/sq-m (Normal 9-42 mL/sq-m) RV SV: 31mL/sq-m (Normal 35-61 mL/sq-m) CI: 1.7L/min/sq-m (Normal 1.8-3.8 L/min/sq-m) Left atrium: Normal size Right atrium: Normal size Mitral valve: Trivial regurgitation Aortic valve: No regurgitation Tricuspid valve: Trivial regurgitation Pulmonic valve: No regurgitation Aorta: Normal proximal ascending aorta Pericardium: Normal IMPRESSION: 1.  No LV thrombus seen 2. Subendocardial LGE consistent with prior infarct in mid anteroseptal, apical anterior/septal walls, and apex. LGE is greater than 50% transmural suggesting nonviability in this region 3. Small LV size, no hypertrophy, and normal global systolic function (EF 58%). Apical aneurysm 4.  Small RV size with normal systolic function (EF 68%) Electronically Signed   By: Lonni Nanas M.D.   On: 05/12/2024 21:19   MR CARDIAC VELOCITY FLOW MAP Result Date: 05/12/2024 CLINICAL DATA:  49F with CAD, ICM p/w acute CVA. Echo concerning for possible apical thrombus. Anterior STEMI in 11/2020. EXAM: CARDIAC MRI TECHNIQUE: The patient was scanned on a 1.5 Tesla Siemens magnet. A dedicated cardiac coil was used. Functional imaging was done using Fiesta sequences. 2,3, and 4 chamber views were done to assess for RWMA's. Modified Simpson's rule using a short axis stack was used to calculate an ejection fraction on a dedicated work [employer]. The patient received 8 cc of Gadavist . After 10 minutes inversion recovery sequences were used to assess for infiltration and scar tissue. Phase contrast velocity mapping was performed above the aortic and pulmonic valves CONTRAST:  8 cc  of Gadavist  FINDINGS: Left ventricle: -Small size -No hypertrophy -Normal global systolic function.  Apical aneurysm -Normal ECV (26%) -Normal T2 values -Subendocardial LGE consistent with prior  infarct in mid anteroseptal, apical anterior/septal walls, and apex. LGE is greater than 50% transmural suggesting nonviability in this region -No LV thrombus seen LV EF:  58% (Normal 52-79%) Absolute volumes: LV EDV: 70mL (Normal 78-167 mL) LV ESV: 29mL (Normal 21-64 mL) LV SV: 40mL (Normal 52-114 mL) CO: 3.0L/min (Normal 2.7-6.3 L/min) Indexed volumes: LV EDV: 32mL/sq-m (Normal 50-96 mL/sq-m) LV ESV: 26mL/sq-m (Normal 10-40 mL/sq-m) LV SV: 5mL/sq-m (Normal 33-64 mL/sq-m) CI: 2.0L/min/sq-m (Normal 1.9-3.9 L/min/sq-m) Right ventricle: Small size with normal  systolic function RV EF: 68% (Normal 52-80%) Absolute volumes: RV EDV: 51mL (Normal 79-175 mL) RV ESV: 17mL (Normal 13-75 mL) RV SV: 35mL (Normal 56-110 mL) CO: 2.6L/min (Normal 2.7-6 L/min) Indexed volumes: RV EDV: 67mLsq-m (Normal 51-97 mL/sq-m) RV ESV: 71mL/sq-m (Normal 9-42 mL/sq-m) RV SV: 75mL/sq-m (Normal 35-61 mL/sq-m) CI: 1.7L/min/sq-m (Normal 1.8-3.8 L/min/sq-m) Left atrium: Normal size Right atrium: Normal size Mitral valve: Trivial regurgitation Aortic valve: No regurgitation Tricuspid valve: Trivial regurgitation Pulmonic valve: No regurgitation Aorta: Normal proximal ascending aorta Pericardium: Normal IMPRESSION: 1.  No LV thrombus seen 2. Subendocardial LGE consistent with prior infarct in mid anteroseptal, apical anterior/septal walls, and apex. LGE is greater than 50% transmural suggesting nonviability in this region 3. Small LV size, no hypertrophy, and normal global systolic function (EF 58%). Apical aneurysm 4.  Small RV size with normal systolic function (EF 68%) Electronically Signed   By: Lonni Nanas M.D.   On: 05/12/2024 21:19   MR CARDIAC VELOCITY FLOW MAP Result Date: 05/12/2024 CLINICAL DATA:  89F with CAD, ICM p/w acute CVA. Echo concerning for possible apical thrombus. Anterior STEMI in 11/2020. EXAM: CARDIAC MRI TECHNIQUE: The patient was scanned on a 1.5 Tesla Siemens magnet. A dedicated cardiac coil was used.  Functional imaging was done using Fiesta sequences. 2,3, and 4 chamber views were done to assess for RWMA's. Modified Simpson's rule using a short axis stack was used to calculate an ejection fraction on a dedicated work [employer]. The patient received 8 cc of Gadavist . After 10 minutes inversion recovery sequences were used to assess for infiltration and scar tissue. Phase contrast velocity mapping was performed above the aortic and pulmonic valves CONTRAST:  8 cc  of Gadavist  FINDINGS: Left ventricle: -Small size -No hypertrophy -Normal global systolic function.  Apical aneurysm -Normal ECV (26%) -Normal T2 values -Subendocardial LGE consistent with prior infarct in mid anteroseptal, apical anterior/septal walls, and apex. LGE is greater than 50% transmural suggesting nonviability in this region -No LV thrombus seen LV EF:  58% (Normal 52-79%) Absolute volumes: LV EDV: 70mL (Normal 78-167 mL) LV ESV: 29mL (Normal 21-64 mL) LV SV: 40mL (Normal 52-114 mL) CO: 3.0L/min (Normal 2.7-6.3 L/min) Indexed volumes: LV EDV: 67mL/sq-m (Normal 50-96 mL/sq-m) LV ESV: 71mL/sq-m (Normal 10-40 mL/sq-m) LV SV: 85mL/sq-m (Normal 33-64 mL/sq-m) CI: 2.0L/min/sq-m (Normal 1.9-3.9 L/min/sq-m) Right ventricle: Small size with normal systolic function RV EF: 68% (Normal 52-80%) Absolute volumes: RV EDV: 51mL (Normal 79-175 mL) RV ESV: 17mL (Normal 13-75 mL) RV SV: 35mL (Normal 56-110 mL) CO: 2.6L/min (Normal 2.7-6 L/min) Indexed volumes: RV EDV: 62mLsq-m (Normal 51-97 mL/sq-m) RV ESV: 40mL/sq-m (Normal 9-42 mL/sq-m) RV SV: 83mL/sq-m (Normal 35-61 mL/sq-m) CI: 1.7L/min/sq-m (Normal 1.8-3.8 L/min/sq-m) Left atrium: Normal size Right atrium: Normal size Mitral valve: Trivial regurgitation Aortic valve: No regurgitation Tricuspid valve: Trivial regurgitation Pulmonic valve: No regurgitation Aorta: Normal proximal ascending aorta Pericardium: Normal IMPRESSION: 1.  No LV thrombus seen 2. Subendocardial LGE consistent with  prior infarct in mid anteroseptal, apical anterior/septal walls, and apex. LGE is greater than 50% transmural suggesting nonviability in this region 3. Small LV size, no hypertrophy, and normal global systolic function (EF 58%). Apical aneurysm 4.  Small RV size with normal systolic function (EF 68%) Electronically Signed   By: Lonni Nanas M.D.   On: 05/12/2024 21:19   MR BRAIN WO CONTRAST Result Date: 05/12/2024 EXAM: MRI BRAIN WITHOUT CONTRAST 05/12/2024 07:06:43 PM TECHNIQUE: Multiplanar multisequence MRI of the head/brain was performed without the administration of intravenous contrast. COMPARISON:  Abnormal CT of the head. CLINICAL HISTORY: Neuro deficit, acute, stroke suspected. Acute neurologic deficit; suspected stroke. Acute onset of left sided weakness which near completely improved following administration of tnk. FINDINGS: BRAIN AND VENTRICLES: Acute subacute infarct of the right frontal operculum. Acute infarct is also noted in the right lentiform nucleus and caudate. A punctate focus of acute infarct is present in the right subinsular white matter. Petechial hemorrhage is present within the basal ganglia infarcts as well as the right frontal operculum infarct. Mass effect is present with effacement of the sulci and partial effacement of the right lateral ventricle. Periventricular white matter changes are present bilaterally separate from the infarct. No midline shift. No hydrocephalus. The sella is unremarkable. Normal flow voids. ORBITS: No significant abnormality. SINUSES AND MASTOIDS: No significant abnormality. BONES AND SOFT TISSUES: Normal marrow signal. No soft tissue abnormality. IMPRESSION: 1. Acute/subacute infarcts in the right frontal operculum, right lentiform nucleus, caudate, and right subinsular white matter, with associated petechial hemorrhage and mass effect. 2. Chronic bilateral periventricular white matter changes, separate from the infarcts. Electronically signed by:  Lonni Necessary MD 05/12/2024 07:30 PM EST RP Workstation: [id number]   ECHOCARDIOGRAM COMPLETE Result Date: 05/12/2024    ECHOCARDIOGRAM REPORT   Patient Name:   Amber Peterson Date of Exam: 05/12/2024 Medical Rec #:  [id number]      Height:       58.0 in Accession #:    [id number]     Weight:       122.1 lb Date of Birth:  12-11-1955      BSA:          1.477 m Patient Age:    68 years       BP:           147/88 mmHg Patient Gender: F              HR:           78 bpm. Exam Location:  Inpatient Procedure: 2D Echo, Cardiac Doppler, Color Doppler and Intracardiac            Opacification Agent (Both Spectral and Color Flow Doppler were            utilized during procedure). Indications:    Stroke  History:        Patient has prior history of Echocardiogram examinations, most                 recent 03/24/2021. Cardiomyopathy, CAD; Risk                 Factors:Hypertension, Diabetes and Dyslipidemia.  Sonographer:    Odella Brewster Referring Phys: [id number] [facility]  Sonographer Comments: Technically difficult study due to poor echo windows. Image acquisition challenging due to respiratory motion. IMPRESSIONS  1. 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 There is possible apical mural thrombus vs swirling coagulum in the cardiac apex. Recommend cardiac MRI to better assess for possible thrombus.  2. Left ventricular ejection fraction, by estimation, is 60 to 65%. Left ventricular ejection fraction by 2D MOD biplane is 62.5 %. The left ventricle has normal function. The left ventricle demonstrates regional wall motion abnormalities (see scoring diagram/findings for description). Left ventricular diastolic parameters were normal. The left ventricular apex is aneurymal in appearance. There is also akinesis of the apical anterior wall.  3. Right ventricular systolic function is normal. The right ventricular size is normal. Tricuspid regurgitation signal is inadequate for assessing PA pressure.  4. The mitral valve is  normal in  structure. No evidence of mitral valve regurgitation. No evidence of mitral stenosis.  5. The aortic valve has an indeterminant number of cusps. Aortic valve regurgitation is not visualized. No aortic stenosis is present.  6. The inferior vena cava is normal in size with greater than 50% respiratory variability, suggesting right atrial pressure of 3 mmHg. Conclusion(s)/Recommendation(s): Cardiac MRI to rule out apical thrombus. FINDINGS  Left Ventricle: Left ventricular ejection fraction, by estimation, is 60 to 65%. Left ventricular ejection fraction by 2D MOD biplane is 62.5 %. The left ventricle has normal function. The left ventricle demonstrates regional wall motion abnormalities. Definity  contrast agent was given IV to delineate the left ventricular endocardial borders. The left ventricular internal cavity size was normal in size. There is no left ventricular hypertrophy. Left ventricular diastolic parameters were normal.  LV Wall Scoring: The apical lateral segment, apical septal segment, apical anterior segment, and apical inferior segment are aneurysmal. The left ventricular apex is aneurymal in appearance. There is also akinesis of the apical anterior wall. Right Ventricle: The right ventricular size is normal. No increase in right ventricular wall thickness. Right ventricular systolic function is normal. Tricuspid regurgitation signal is inadequate for assessing PA pressure. Left Atrium: Left atrial size was normal in size. Right Atrium: Right atrial size was normal in size. Pericardium: There is no evidence of pericardial effusion. Mitral Valve: The mitral valve is normal in structure. No evidence of mitral valve regurgitation. No evidence of mitral valve stenosis. MV peak gradient, 3.1 mmHg. The mean mitral valve gradient is 2.0 mmHg. Tricuspid Valve: The tricuspid valve is normal in structure. Tricuspid valve regurgitation is not demonstrated. No evidence of tricuspid stenosis. Aortic Valve: The aortic  valve has an indeterminant number of cusps. Aortic valve regurgitation is not visualized. No aortic stenosis is present. Aortic valve mean gradient measures 3.0 mmHg. Aortic valve peak gradient measures 6.9 mmHg. Aortic valve area, by VTI measures 2.18 cm. Pulmonic Valve: The pulmonic valve was normal in structure. Pulmonic valve regurgitation is not visualized. No evidence of pulmonic stenosis. Aorta: The aortic root and ascending aorta are structurally normal, with no evidence of dilitation. Venous: The inferior vena cava is normal in size with greater than 50% respiratory variability, suggesting right atrial pressure of 3 mmHg. IAS/Shunts: No atrial level shunt detected by color flow Doppler.  LEFT VENTRICLE PLAX 2D                        Biplane EF (MOD) LVIDd:         4.20 cm         LV Biplane EF:   Left LVIDs:         2.35 cm                          ventricular LV PW:         0.90 cm                          ejection LV IVS:        0.90 cm                          fraction by LVOT diam:     1.90 cm  2D MOD LV SV:         52                               biplane is LV SV Index:   35                               62.5 %. LVOT Area:     2.84 cm LV IVRT:       106 msec        Diastology                                LV e' medial:    8.05 cm/s                                LV E/e' medial:  9.8 LV Volumes (MOD)               LV e' lateral:   8.38 cm/s LV vol d, MOD    45.6 ml       LV E/e' lateral: 9.4 A2C: LV vol d, MOD    52.4 ml A4C: LV vol s, MOD    16.7 ml A2C: LV vol s, MOD    20.2 ml A4C: LV SV MOD A2C:   28.9 ml LV SV MOD A4C:   52.4 ml LV SV MOD BP:    30.8 ml RIGHT VENTRICLE             IVC RV S prime:     12.00 cm/s  IVC diam: 1.40 cm TAPSE (M-mode): 1.9 cm                             PULMONARY VEINS                             Diastolic Velocity: 32.50 cm/s                             S/D Velocity:       1.60                             Systolic Velocity:  52.20 cm/s LEFT  ATRIUM             Index LA diam:        2.50 cm 1.69 cm/m LA Vol (A2C):   30.1 ml 20.39 ml/m LA Vol (A4C):   16.0 ml 10.84 ml/m LA Biplane Vol: 22.6 ml 15.31 ml/m  AORTIC VALVE                    PULMONIC VALVE AV Area (Vmax):    2.13 cm     PV Vmax:       1.03 m/s AV Area (Vmean):   2.23 cm     PV Peak grad:  4.2 mmHg AV Area (VTI):     2.18 cm AV Vmax:           131.00 cm/s AV Vmean:          83.900  cm/s AV VTI:            0.238 m AV Peak Grad:      6.9 mmHg AV Mean Grad:      3.0 mmHg LVOT Vmax:         98.20 cm/s LVOT Vmean:        65.900 cm/s LVOT VTI:          0.183 m LVOT/AV VTI ratio: 0.77  AORTA Ao Root diam: 2.20 cm MITRAL VALVE MV Area (PHT): 2.94 cm    SHUNTS MV Area VTI:   2.47 cm    Systemic VTI:  0.18 m MV Peak grad:  3.1 mmHg    Systemic Diam: 1.90 cm MV Mean grad:  2.0 mmHg MV Vmax:       0.88 m/s MV Vmean:      59.9 cm/s MV Decel Time: 258 msec MV E velocity: 78.60 cm/s MV A velocity: 87.00 cm/s MV E/A ratio:  0.90 Amber Peterson Electronically signed by Amber Peterson Signature Date/Time: 05/12/2024/12:22:34 PM    Final    CT ANGIO HEAD NECK W WO CM (CODE STROKE) Result Date: 05/11/2024 CLINICAL DATA:  Initial evaluation for acute neuro deficit, stroke suspected. EXAM: CT ANGIOGRAPHY HEAD AND NECK CT PERFUSION BRAIN TECHNIQUE: Multidetector CT imaging of the head and neck was performed using the standard protocol during bolus administration of intravenous contrast. Multiplanar CT image reconstructions and MIPs were obtained to evaluate the vascular anatomy. Carotid stenosis measurements (when applicable) are obtained utilizing NASCET criteria, using the distal internal carotid diameter as the denominator. Multiphase CT imaging of the brain was performed following IV bolus contrast injection. Subsequent parametric perfusion maps were calculated using RAPID software. RADIATION DOSE REDUCTION: This exam was performed according to the departmental dose-optimization program which includes  automated exposure control, adjustment of the mA and/or kV according to patient size and/or use of iterative reconstruction technique. CONTRAST:  OMNIPAQUE  IOHEXOL  350 MG/ML SOLN COMPARISON:  Comparison made with head CT from earlier the same day. FINDINGS: CTA NECK FINDINGS Aortic arch: Aortic arch within normal limits for caliber with standard branch pattern. Mild aortic atherosclerosis. No stenosis about the origin the great vessels. Right carotid system: Right common and internal carotid arteries are patent without dissection. Moderate atheromatous change about the right carotid bulb/proximal cervical right ICA without hemodynamically significant greater than 50% stenosis. Left carotid system: Left common and internal carotid arteries are patent without dissection. Moderate atheromatous change about the left carotid bulb without hemodynamically significant greater than 50% stenosis. Vertebral arteries: Both vertebral arteries arise from the subclavian arteries. Moderate atheromatous stenosis at the origin of the left vertebral artery. Vertebral arteries otherwise patent without stenosis or dissection. Skeleton: No worrisome osseous lesions. Reversal of the normal cervical lordosis with grade 1 anterolisthesis of C3 on C4 and C4 on C5. Moderate spondylosis at C5-6 and C6-7. Other neck: No other acute finding. Upper chest: No other acute finding. Review of the MIP images confirms the above findings CTA HEAD FINDINGS Anterior circulation: Atheromatous change about the carotid siphons bilaterally. Resultant moderate to severe stenosis at the para clinoid right ICA (series 6, image 127). No significant stenosis about the contralateral left siphon. A1 segments patent bilaterally. Normal anterior communicating complex. Anterior cerebral arteries patent without stenosis. Left M1 segment and distal left MCA branches are patent and well perfused. Focal severe stenosis versus short-segment subtotal occlusion of the  mid-distal right M1 segment (series 8, image 110). A radiographic string sign is present. Right  MCA and its branches remain patent and perfused distally. Posterior circulation: Both V4 segments patent without significant stenosis. Both PICA patent. Basilar patent without stenosis. Superior cerebral arteries patent bilaterally. Both PCAs primarily supplied via the basilar. Multifocal moderate stenoses about the right P1/P2 segments (series 12, image 21). Left PCA patent without significant stenosis. Patent allowing for timing the contrast bolus. Venous sinuses: None significant.  No aneurysm. Anatomic variants: 9 Review of the MIP images confirms the above findings CT Brain Perfusion Findings: ASPECTS: 9 CBF (<30%) Volume: 37mL Perfusion (Tmax>6.0s) volume: 83mL Mismatch Volume: 46mL Infarction Location:Acute right MCA core infarct centered at the operculum. 46 mL surrounding ischemic penumbra, mismatch regio 2.2. IMPRESSION: 1. Focal severe stenosis versus short-segment subtotal occlusion of the mid-distal right M1 segment. A radiographic string sign is present. Right MCA and its branches remain patent and perfused distally. 2. Acute right MCA core infarct centered at the operculum, with 46 mL surrounding ischemic penumbra. 3. Atheromatous change about the right carotid siphon with associated moderate to severe stenosis at the para clinoid segment. 4. Moderate atheromatous stenosis at the origin of the left vertebral artery. 5. Multifocal moderate stenoses about the right P1/P2 segments. Aortic Atherosclerosis (ICD10-I70.0). Critical Value/emergent results were called by telephone at the time of interpretation on 05/11/2024 at 9:50 pm to provider Dr. Norlin, Who verbally acknowledged these results. Electronically Signed   By: Morene Hoard M.D.   On: 05/11/2024 22:04   CT CEREBRAL PERFUSION W CONTRAST Result Date: 05/11/2024 CLINICAL DATA:  Initial evaluation for acute neuro deficit, stroke suspected. EXAM:  CT ANGIOGRAPHY HEAD AND NECK CT PERFUSION BRAIN TECHNIQUE: Multidetector CT imaging of the head and neck was performed using the standard protocol during bolus administration of intravenous contrast. Multiplanar CT image reconstructions and MIPs were obtained to evaluate the vascular anatomy. Carotid stenosis measurements (when applicable) are obtained utilizing NASCET criteria, using the distal internal carotid diameter as the denominator. Multiphase CT imaging of the brain was performed following IV bolus contrast injection. Subsequent parametric perfusion maps were calculated using RAPID software. RADIATION DOSE REDUCTION: This exam was performed according to the departmental dose-optimization program which includes automated exposure control, adjustment of the mA and/or kV according to patient size and/or use of iterative reconstruction technique. CONTRAST:  100mL OMNIPAQUE  IOHEXOL  350 MG/ML SOLN COMPARISON:  Comparison made with head CT from earlier the same day. FINDINGS: CTA NECK FINDINGS Aortic arch: Aortic arch within normal limits for caliber with standard branch pattern. Mild aortic atherosclerosis. No stenosis about the origin the great vessels. Right carotid system: Right common and internal carotid arteries are patent without dissection. Moderate atheromatous change about the right carotid bulb/proximal cervical right ICA without hemodynamically significant greater than 50% stenosis. Left carotid system: Left common and internal carotid arteries are patent without dissection. Moderate atheromatous change about the left carotid bulb without hemodynamically significant greater than 50% stenosis. Vertebral arteries: Both vertebral arteries arise from the subclavian arteries. Moderate atheromatous stenosis at the origin of the left vertebral artery. Vertebral arteries otherwise patent without stenosis or dissection. Skeleton: No worrisome osseous lesions. Reversal of the normal cervical lordosis with grade  1 anterolisthesis of C3 on C4 and C4 on C5. Moderate spondylosis at C5-6 and C6-7. Other neck: No other acute finding. Upper chest: No other acute finding. Review of the MIP images confirms the above findings CTA HEAD FINDINGS Anterior circulation: Atheromatous change about the carotid siphons bilaterally. Resultant moderate to severe stenosis at the para clinoid right ICA (series 6, image 127).  No significant stenosis about the contralateral left siphon. A1 segments patent bilaterally. Normal anterior communicating complex. Anterior cerebral arteries patent without stenosis. Left M1 segment and distal left MCA branches are patent and well perfused. Focal severe stenosis versus short-segment subtotal occlusion of the mid-distal right M1 segment (series 8, image 110). A radiographic string sign is present. Right MCA and its branches remain patent and perfused distally. Posterior circulation: Both V4 segments patent without significant stenosis. Both PICA patent. Basilar patent without stenosis. Superior cerebral arteries patent bilaterally. Both PCAs primarily supplied via the basilar. Multifocal moderate stenoses about the right P1/P2 segments (series 12, image 21). Left PCA patent without significant stenosis. Patent allowing for timing the contrast bolus. Venous sinuses: None significant.  No aneurysm. Anatomic variants: 9 Review of the MIP images confirms the above findings CT Brain Perfusion Findings: ASPECTS: 9 CBF (<30%) Volume: 37mL Perfusion (Tmax>6.0s) volume: 83mL Mismatch Volume: 46mL Infarction Location:Acute right MCA core infarct centered at the operculum. 46 mL surrounding ischemic penumbra, mismatch regio 2.2. IMPRESSION: 1. Focal severe stenosis versus short-segment subtotal occlusion of the mid-distal right M1 segment. A radiographic string sign is present. Right MCA and its branches remain patent and perfused distally. 2. Acute right MCA core infarct centered at the operculum, with 46 mL  surrounding ischemic penumbra. 3. Atheromatous change about the right carotid siphon with associated moderate to severe stenosis at the para clinoid segment. 4. Moderate atheromatous stenosis at the origin of the left vertebral artery. 5. Multifocal moderate stenoses about the right P1/P2 segments. Aortic Atherosclerosis (ICD10-I70.0). Critical Value/emergent results were called by telephone at the time of interpretation on 05/11/2024 at 9:50 pm to provider Dr. Norlin, Who verbally acknowledged these results. Electronically Signed   By: Morene Hoard M.D.   On: 05/11/2024 22:04   CT HEAD CODE STROKE WO CONTRAST (LKW 0-4.5h, LVO 0-24h) Result Date: 05/11/2024 CLINICAL DATA:  Code stroke. Initial evaluation for acute neuro deficit, stroke suspected. EXAM: CT HEAD WITHOUT CONTRAST TECHNIQUE: Contiguous axial images were obtained from the base of the skull through the vertex without intravenous contrast. RADIATION DOSE REDUCTION: This exam was performed according to the departmental dose-optimization program which includes automated exposure control, adjustment of the mA and/or kV according to patient size and/or use of iterative reconstruction technique. COMPARISON:  Prior CT from 06/08/2020. FINDINGS: Brain: Cerebral volume within normal limits. No acute intracranial hemorrhage. Subtle loss of gray-white matter differentiation at the right lentiform nucleus, concerning for an acute right MCA territory infarct. Gray-white matter differentiation otherwise grossly maintained at this time. No mass lesion or midline shift. No hydrocephalus or extra-axial fluid collection. Vascular: Question asymmetric hyperdensity in the region of a proximal right M2 branch at the right sylvian fissure (series 3, image 11). Skull: Scalp soft tissues demonstrate no acute finding. Calvarium intact. Sinuses/Orbits: Right gaze preference noted. Visualized paranasal sinuses are clear. No significant mastoid effusion. Other: None.  ASPECTS [facility] Stroke Program Early CT Score) - Ganglionic level infarction (caudate, lentiform nuclei, internal capsule, insula, M1-M3 cortex): 6 - Supraganglionic infarction (M4-M6 cortex): 3 Total score (0-10 with 10 being normal): 9 IMPRESSION: 1. Subtle loss of gray-white matter differentiation at the right lentiform nucleus, concerning for an acute right MCA territory infarct. No intracranial hemorrhage. 2. Aspects is 9. 3. Question asymmetric hyperdensity in the region of a proximal right M2 branch at the right Sylvian fissure, which could reflect thrombus. Correlation with dedicated CTA recommended. Results communicated by telephone at the time of interpretation on 05/11/2024 at 9:20 pm  to provider Dr. Norlin, who verbally acknowledged these results. Electronically Signed   By: Morene Hoard M.D.   On: 05/11/2024 21:24      HISTORY OF PRESENT ILLNESS 69 y.o. female  with hx of hepatitis C, hypertension, hyperlipidemia, CAD, ischemic cardiomyopathy who initially presented to [facility] ED by [facility] for left-sided weakness and a right gaze deviation.  Symptoms concerning for a right MCA infarct.  She was offered and given TNKase  at [facility].  She was found to have a subocclusive stenosis/thrombus in the right distal M1 with a core of 37 mL and a penumbra of 46 mL and a mismatch ratio of 2.2.  Post TNKase , there was no noted immediate improvement and therefore she was offered thrombectomy.  However, upon arrival to [facility] ED, she was noted to have had significant improvement in her symptoms with now complete resolution of the early and noted left-sided weakness and only a mild residual right gaze preference.  Her NIH stroke scale is a 1.  Given significant improvement and only mild nondisabling deficits, thrombectomy was canceled. NIH on Admission 1    HOSPITAL COURSE Stroke:  right MCA territory infarcts s/p TNK, etiology:  large vessel disease with R M1  severe stenosis   Code Stroke CT head  Subtle loss of gray-white matter differentiation at the right lentiform nucleus, concerning for an acute right MCA territory infarct. No intracranial hemorrhage. Aspects is 9. CTA head & neck Focal severe stenosis versus short-segment subtotal occlusion of the mid-distal right M1 segment. A radiographic string sign is present. Right MCA and its branches remain patent and perfused distally. Atheromatous change about the right carotid siphon with associated moderate to severe stenosis at the para clinoid segment. Moderate atheromatous stenosis at the origin of the left vertebral artery. Multifocal moderate stenoses about the right P1/P2 segments. CT perfusion Penumbra of 46 mL, core of 37 mL. Mismatch ratio of 2.2.  MRI: Acute/subacute infarcts in the right frontal operculum, right lentiform nucleus, caudate, and right subinsular white matter, with associated petechial hemorrhage and mass effect.  2D Echo: EF 60-65%, Akinesis of apical anterior wall ? clot Cardiac MRI: No LV thrombus.  Subendocardial LGE consistent with prior infarct in mid anteroseptal, apical anterior/septal walls and apex.  Small LV size, no hypertrophy.  EF 58% LDL 45 HgbA1c 6.5 VTE prophylaxis - lovenox  clopidogrel  75 mg daily prior to admission, continue aspirin  and Brilinta  for 1 month, then back to Plavix  daily. Pt declined CAPTIVA trial Therapy recommendations:  Outpatient PT/OT/ST Disposition:  home today  HTN Home Meds: Plavix , Lipitor , Toprol -XL, Spirinolactone, furosemide  Recommend close follow-up with cardiology for medication review, patient endorses dizzy spells and low BP.  BP stable now Orthostatic vitals no orthostasis Continue metoprolol  12.5mg  BID and Entresto . 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 rate 19, height 4' 10 (  1.473 m), weight 55.4 kg, SpO2 94%.  PHYSICAL EXAM General:  Alert, well-nourished, well-developed patient in no acute distress Psych:  Mood and affect appropriate for situation CV: Regular rate and rhythm on monitor Respiratory:  Regular, unlabored respirations on room air GI: Abdomen soft and nontender   NEURO:  Mental Status: AA&Ox3, patient is able to give clear and coherent history Speech/Language: speech is without dysarthria or aphasia.  Naming, repetition, fluency, and comprehension intact.   Cranial Nerves:  II: PERRL. Visual fields full.  III, IV, VI: EOMI. No preference.  V: Sensation is intact to light touch and symmetrical to face.  VII: Face is symmetrical resting and smiling VIII: hearing intact to voice. IX, X: Palate elevates symmetrically. Phonation is normal.  KP:Dynloizm shrug 5/5. XII: tongue is midline without fasciculations. Motor: 5/5 strength to all muscle groups tested.  Tone: is normal and bulk is normal Sensation- Intact to light touch bilaterally. Extinction absent to light touch to DSS.   Coordination: FTN intact bilaterally, HKS: no ataxia in BLE.No drift.  Gait- deferred   Most Recent NIH 0  Discharge Diet       Diet   Diet Heart Room service appropriate? Yes; Fluid consistency: Thin   liquids  DISCHARGE PLAN Disposition:  home 2/1 Aspirin  and Brilinta  for 1 month, then take only Plavix  daily for secondary stroke prevention Ongoing stroke risk factor control  by Primary Care Physician at time of discharge Follow-up PCP Shona Norleen PEDLAR, MD in 2 weeks. Follow-up with Cardiology, Dr. Anner. Follow-up in [facility] in 8 weeks, office to schedule an appointment.   35 minutes were spent preparing discharge.   Amber JAYSON Likes, DNP [facility]   ATTENDING NOTE: I reviewed above note and agree with the assessment and plan. Pt was seen and examined.   No family at bedside.  Patient reclining in bed, neuro intact, wants to go home.  PT and OT recommend outpatient therapy.  On DAPT with aspirin  and Brilinta  for 1 month, and then Plavix  alone.  Continue statin.  Educated on BP monitoring at home, avoid low BP.  Follow-up with cardiology for further GDMT and BP meds adjustment.  Follow-up at [facility].  For detailed assessment and plan, please refer to above as I have made changes wherever appropriate.   Amber Cummins, MD PhD Stroke Neurology 05/14/2024 5:50 PM

## 2024-05-15 ENCOUNTER — Telehealth: Payer: Self-pay

## 2024-05-15 ENCOUNTER — Other Ambulatory Visit (HOSPITAL_COMMUNITY): Payer: Self-pay

## 2024-05-15 NOTE — Patient Instructions (Signed)
 Visit Information  Thank you for taking time to visit with me today. Please don't hesitate to contact me if I can be of assistance to you before our next scheduled telephone appointment.  Our next appointment is by telephone on 05/22/24 at 1000  Following is a copy of your care plan:   Goals Addressed             This Visit's Progress    VBCI Transitions of Care (TOC) Care Plan       Problems:  Recent Hospitalization for treatment of stroke Knowledge Deficit Related to Multiple medications  Goal:  Over the next 30 days, the patient will not experience hospital readmission  Interventions:  Transitions of Care: Doctor Visits  - discussed the importance of doctor visits Communication with PCP re: Enrollment in Transition of Care nurse program  Stroke: Reviewed Importance of taking all medications as prescribed Assessed for signs and symptoms of stroke Reviewed referrals to outpatient therapy Assessed for cognitive impairment  Patient Self Care Activities:  Call provider office for new concerns or questions  Notify RN Care Manager of TOC call rescheduling needs Participate in Transition of Care Program/Attend TOC scheduled calls Patient will purchase BP monitor and keep BP log Patient with do deep breathing exercises  Plan:  The patient has been provided with contact information for the care management team and has been advised to call with any health related questions or concerns.         Patient verbalizes understanding of instructions and care plan provided today and agrees to view in MyChart. Active MyChart status and patient understanding of how to access instructions and care plan via MyChart confirmed with patient.     Please call the care guide team at [phone] if you need to cancel or reschedule your appointment.   Please call the Suicide and Crisis Lifeline: 988 call 1-800-273-TALK (toll free, 24 hour hotline) if you are experiencing a Mental Health or  Behavioral Health Crisis or need someone to talk to.  Almarie Shams, RN [facility] RN Care Manager / Transition of Care Direct Dial: [phone]

## 2024-05-18 ENCOUNTER — Ambulatory Visit: Admitting: Physician Assistant

## 2024-05-18 ENCOUNTER — Encounter: Payer: Self-pay | Admitting: Physician Assistant

## 2024-05-18 VITALS — BP 120/70 | HR 65 | Ht <= 58 in | Wt 123.0 lb

## 2024-05-18 DIAGNOSIS — I639 Cerebral infarction, unspecified: Secondary | ICD-10-CM

## 2024-05-18 DIAGNOSIS — E785 Hyperlipidemia, unspecified: Secondary | ICD-10-CM | POA: Diagnosis not present

## 2024-05-18 DIAGNOSIS — I2510 Atherosclerotic heart disease of native coronary artery without angina pectoris: Secondary | ICD-10-CM | POA: Diagnosis not present

## 2024-05-18 DIAGNOSIS — I255 Ischemic cardiomyopathy: Secondary | ICD-10-CM | POA: Diagnosis not present

## 2024-05-18 DIAGNOSIS — I10 Essential (primary) hypertension: Secondary | ICD-10-CM

## 2024-05-18 NOTE — Patient Instructions (Signed)
 Medication Instructions:  HOLD ENTRESTO  *If you need a refill on your cardiac medications before your next appointment, please call your pharmacy*  Lab Work: NO LABS If you have labs (blood work) drawn today and your tests are completely normal, you will receive your results only by: MyChart Message (if you have MyChart) OR A paper copy in the mail If you have any lab test that is abnormal or we need to change your treatment, we will call you to review the results.  Testing/Procedures: [facility]- Long Term Monitor Instructions  Your physician has requested you wear a ZIO patch monitor for 14 days.  This is a single patch monitor. [employer] supplies one patch monitor per enrollment. Additional stickers are not available. Please do not apply patch if you will be having a Nuclear Stress Test,  Echocardiogram, Cardiac CT, MRI, or Chest Xray during the period you would be wearing the  monitor. The patch cannot be worn during these tests. You cannot remove and re-apply the  ZIO XT patch monitor.  Your ZIO patch monitor will be mailed 3 day USPS to your address on file. It may take 3-5 days  to receive your monitor after you have been enrolled.  Once you have received your monitor, please review the enclosed instructions. Your monitor  has already been registered assigning a specific monitor serial # to you.  Billing and Patient Assistance Program Information  We have supplied [employer] with any of your insurance information on file for billing purposes. [employer] offers a sliding scale Patient Assistance Program for patients that do not have  insurance, or whose insurance does not completely cover the cost of the ZIO monitor.  You must apply for the Patient Assistance Program to qualify for this discounted rate.  To apply, please call [employer] at [phone], select option 4, select option 2, ask to apply for  Patient Assistance Program. Meredeth will ask your household income, and how many people   are in your household. They will quote your out-of-pocket cost based on that information.  [employer] will also be able to set up a 81-month, interest-free payment plan if needed.  Applying the monitor   Shave hair from upper left chest.  Hold abrader disc by orange tab. Rub abrader in 40 strokes over the upper left chest as  indicated in your monitor instructions.  Clean area with 4 enclosed alcohol pads. Let dry.  Apply patch as indicated in monitor instructions. Patch will be placed under collarbone on left  side of chest with arrow pointing upward.  Rub patch adhesive wings for 2 minutes. Remove white label marked 1. Remove the white  label marked 2. Rub patch adhesive wings for 2 additional minutes.  While looking in a mirror, press and release button in center of patch. A small green light will  flash 3-4 times. This will be your only indicator that the monitor has been turned on.  Do not shower for the first 24 hours. You may shower after the first 24 hours.  Press the button if you feel a symptom. You will hear a small click. Record Date, Time and  Symptom in the Patient Logbook.  When you are ready to remove the patch, follow instructions on the last 2 pages of Patient  Logbook. Stick patch monitor onto the last page of Patient Logbook.  Place Patient Logbook in the blue and white box. Use locking tab on box and tape box closed  securely. The blue and white box has prepaid  postage on it. Please place it in the mailbox as  soon as possible. Your physician should have your test results approximately 7 days after the  monitor has been mailed back to [facility].  Call [facility] Customer Care at [phone] if you have questions regarding  your ZIO XT patch monitor. Call them immediately if you see an orange light blinking on your  monitor.  If your monitor falls off in less than 4 days, contact our Monitor department at [phone].  If your monitor becomes loose or  falls off after 4 days call [employer] at [phone] for  suggestions on securing your monitor   Follow-Up: At [facility], you and your health needs are our priority.  As part of our continuing mission to provide you with exceptional heart care, our providers are all part of one team.  This team includes your primary Cardiologist (physician) and Advanced Practice Providers or APPs (Physician Assistants and Nurse Practitioners) who all work together to provide you with the care you need, when you need it.  Your next appointment:   2-3 month(s)  Provider:   Alm Clay, MD    Other Instructions

## 2024-05-18 NOTE — Progress Notes (Signed)
 " Cardiology Office Note   Date:  05/18/2024  ID:  Shukri, Nistler 1955/07/12, MRN [id number] PCP: Shona Norleen PEDLAR, MD  [facility] Providers Cardiologist:  Alm Clay, MD     History of Present Illness Amber Peterson is a 69 y.o. female was past medical history of hepatitis C, CAD, ischemic cardiomyopathy, hypertension, hyperlipidemia and recent CVA.  Patient had anterior STEMI in August 2022.  Initial cardiac catheterization revealed 100% occluded proximal to mid LAD treated with Onyx frontier 2.5 x 26 mm DES, 80% proximal to mid left circumflex lesion, 99% mid left circumflex lesion, 30% proximal RCA lesion.  Echocardiogram obtained in August 2022 showed EF 30 to 35% with akinetic wall motion abnormality.  She returned to the Cath Lab on 12/10/2020 and underwent DES to mid left circumflex lesion.  Postprocedure, patient was placed on aspirin  and Brilinta  and was placed on LifeVest.  Repeat echocardiogram in December 2022 showed EF has improved to 55 to 60%, LV with regional wall motion abnormality, akinesis of the septal segment and apex, hypokinetic mid segment.  Patient was last seen by Katlyn West on 04/12/2024 at which time she was doing well.  At that time, she has just returned from a cruise and was not very active.    Since the last visit, patient presented to the [facility] emergency room on 05/11/2024 was left-sided weakness, right gaze and was diagnosed with acute CVA with right MCA occlusion.  She was given TNKase  at [facility].  CTA of the head and neck showed focal severe stenosis versus short segment subtotal occlusion of mid to distal right M1 segment, a radiographic string sign is present, right MCA and its branches remain patent and perfused distally.  There was no acute right MCA core infarct centered at the operculum.  Initial plan was to be transferred to [facility] for thrombectomy by interventional radiology service, however on arrival to [facility],  neurological symptoms significantly improved, therefore thrombectomy was canceled. MRI showed acute/subacute infarct in the right frontal operculum, right lentiform nucleus, caudate and right subinsular white matter with associated petechial hemorrhage and mass effect.  Echocardiogram showed EF 60 to 65%, akinesis of the apical anterior wall, possible apical mural thrombus versus swirling coagulum in the cardiac apex, recommend cardiac MRI to further assess.  Cardiac MRI showed no LV thrombus, subendocardial LGE consistent with prior infarct in the mid anteroseptal, apical anterior and septal wall and apex, EF 58%.  Neurology recommended long-term BP goal 130-150 mmHg given left M1 string sign.  She was encouraged to follow-up with cardiology service for medication review and to avoid hypotension.  Patient was discharged on 1 month of aspirin  and Brilinta  then Plavix  monotherapy afterward. Patient declined CAPTIVA trial.   Patient presents today for follow-up.  She denies any ipsilateral weakness.  She denies any vision issues at this point.  Blood pressure is still running in the low 120s, recent blood pressure goal was 130-150s based on string sign seen on the CT image.  We decided to stop her Entresto  for the time being.  Her spironolactone  was discontinued during the recent hospitalization.  Metoprolol  succinate was switched to metoprolol  tartrate by neurology service.  I also recommended a heart monitor as well.  There is no mention of thrombotic versus embolic nature of the stroke.  I think would be reasonable to rule out the possibility of atrial fibrillation.  She can follow-up with Dr. Clay in 2 to 3 months.  I  will defer to Dr. Anner to decide whether or not to repeat a limited echocardiogram with Definity  contrast to look at her LV.  Initial echocardiogram was concerning for clot versus swirling coagulum at the cardiac apex, however EF was 60 to 65%, cardiac MRI was eventually obtained which showed  no clot.   ROS:   She denies chest pain, palpitations, dyspnea, pnd, orthopnea, n, v, dizziness, syncope, edema, weight gain, or early satiety. All other systems reviewed and are otherwise negative except as noted above.    Studies Reviewed      Cardiac Studies & Procedures   ______________________________________________________________________________________________ CARDIAC CATHETERIZATION  CARDIAC CATHETERIZATION 12/10/2020  Conclusion   Ost Cx lesion is 30% stenosed.   Prox LAD lesion is 50% stenosed.   2nd Mrg lesion is 90% stenosed.   Mid Cx lesion is 99% stenosed.   Prox Cx to Mid Cx lesion is 25% stenosed.   Previously placed Prox LAD to Mid LAD stent (unknown type) is  widely patent.   A drug-eluting stent was successfully placed.   A stent was successfully placed.   Post intervention, there is a 0% residual stenosis.   Post intervention, there is a 20% residual stenosis.   Post intervention, there is a 0% residual stenosis.  Short left main coronary artery which bifurcated into the LAD and left circumflex.  There is smooth 50% narrowing in the LAD proximal to the septal perforating artery and the previously placed LAD stent.  The LAD stent is widely patent.  There was initial ostial narrowing of the circumflex with ST elevation with initial catheter engagement resulting in need for XB 3.0 guide catheter with sideholes.  The circumflex gave rise to a large second marginal vessel with 90% stenosis.  The small AV groove circumflex was 99% stenosed after marginal vessel takeoff.  Successful PTCA stenting of the circumflex into the OM 2 vessel with ultimate insertion of a 2.0 x 15 mm Onyx frontier stent postdilated to 2.3 mm.  PTCA of the subtotal AV groove circumflex with a 1.5 x 12 mm balloon.  There was thrombus formation in the AV vessel which had increased after stenting into the marginal vessel.  The AV groove circumflex was rewired and successfully re-dilated.   Patient  was started on Aggrastat  bolus plus infusion with residual thrombus;  there was no evidence for dissection.  LVEDP prior to intervention was 13 mmHg.  RECOMMENDATION: DAPT for minimum of 1 year but probably longer.  Continue Aggrastat  infusion 18 hours post procedure.  The patient will be maintained on low-dose IV nitroglycerin .  Aggressive lipid-lowering therapy abdominal blood pressure control.  Findings Coronary Findings Diagnostic  Dominance: Right  Left Anterior Descending Prox LAD lesion is 50% stenosed. Previously placed Prox LAD to Mid LAD stent (unknown type) is  widely patent.  Left Circumflex Vessel is small. Ost Cx lesion is 30% stenosed. Prox Cx to Mid Cx lesion is 25% stenosed. Mid Cx lesion is 99% stenosed.  First Obtuse Marginal Branch Vessel is small in size.  Second Obtuse Marginal Branch 2nd Mrg lesion is 90% stenosed.  Intervention  Prox Cx to Mid Cx lesion Stent A stent was successfully placed. Post-Intervention Lesion Assessment The intervention was successful. Pre-interventional TIMI flow is 3. Post-intervention TIMI flow is 3. There is a 0% residual stenosis post intervention.  Mid Cx lesion Post-Intervention Lesion Assessment The intervention was successful. Pre-interventional TIMI flow is 3. Post-intervention TIMI flow is 3. At this lesion, a thrombus occurred. There is a 20%  residual stenosis post intervention.  2nd Mrg lesion Stent Pre-stent angioplasty was performed. A drug-eluting stent was successfully placed. Stent strut is well apposed. Post-Intervention Lesion Assessment The intervention was successful. Pre-interventional TIMI flow is 3. Post-intervention TIMI flow is 3. No complications occurred at this lesion. There is a 0% residual stenosis post intervention.   CARDIAC CATHETERIZATION 12/08/2020  Conclusion Images from the original result were not included.    Prox RCA lesion is 30% stenosed.   Prox LAD to Mid LAD lesion is  100% stenosed.   Prox Cx to Mid Cx lesion is 80% stenosed.   Mid Cx lesion is 99% stenosed.   A drug-eluting stent was successfully placed using a STENT ONYX FRONTIER 2.5X26.   Post intervention, there is a 0% residual stenosis.  Amber Peterson is a 69 y.o. female   [id number] LOCATION:  FACILITY: [facility] PHYSICIAN: Dorn Lesches, M.D. Oct 23, 1955   DATE OF PROCEDURE:  12/08/2020  DATE OF DISCHARGE:     CARDIAC CATHETERIZATION / PCI DES mid LAD    History obtained from chart review.  69 year old single Caucasian female mother of 1 child who lives in [place] without prior cardiac history.  She does have a history of treated hypertension.  She was awakened at approximately 330 this morning with chest pain and back pain rating to her shoulder.  She went to [facility] emergency room where an EKG showed anterior ST segment elevation with Q waves in her anterior precordial leads.  She was transferred at approximately 11:00 (approximately 8 hours after symptom onset) for urgent catheterization and potential intervention.   PROCEDURE DESCRIPTION:  The patient was brought to the second floor [facility] cath lab in the postabsorptive state.  She was premedicated with IV Benadryl  and Solu-Medrol  for contrast allergy prophylaxis.  She also received IV fentanyl .  Her right wrist was prepped and shaved in usual sterile fashion. Xylocaine  1% was used for local anesthesia. A 6 French sheath was inserted into the right radial artery using standard Seldinger technique. The patient received 3500 units  of heparin  intravenously.  A 5 French TIG catheter and pigtail catheter used for selective coronary angiography and obtain left heart pressures.  Isovue dye was used for the entirety of the case (130 cc total administered to patient).  Retrograde aortic, ventricular and pullback pressures were recorded.  Radial cocktail was administered via the SideArm sheath.  Patient was seen for total [place]  units of heparin  with an ACT of 387.  Isovue dye is used for the entirety of the intervention.  Retroaortic pressures monitored during the case.  She was hypertensive throughout the case.  180 mg of p.o. Brilinta  was administered to the patient p.o. prior to intervention.  Using a 6 French XB LAD 3.0 cm guide cath along with 0.14 [place] guidewire and a 2 mm x 12 m balloon the wire easily crossed the lesion providing antegrade flow with a door to balloon time of 37 minutes.  I then carefully positioned a 2.5 mm x 26 mm long Medtronic frontier drug-eluting stent and deployed at 14 atm.  I postdilated the entire stented segment with a 2.5 mm x 15 mm long noncompliant balloon at 18 atm (2.6 mm) resulting reduction of a total occlusion to 0% residual TIMI-3 flow.  I did give 200 mcg of intracoronary glycerin.  There was pruning of the distal vessel.  There was an 80% AV groove circumflex stenosis just before the takeoff of a large marginal branch with  a 99% continuation of the AV groove circumflex.  Impression Ms. Beaulah had an occluded LAD with a door to balloon time of 37 minutes and most likely a completed anterior STEMI although she was having residual back pain.  She did have Q waves in the anterior leads and restoration of blood flow occurred approximately 8 hours after symptom onset.  I began her on low-dose IV nitroglycerin  for blood pressure control.  She will need guideline directed optimal medical therapy with beta-blockade plus or minus ACE/ARB depending on her LV function which we determined by 2D echocardiogram.  She has residual circumflex disease which will be intervened on in a staged fashion.  The sheath was removed and a TR band was placed on the right wrist to achieve patent hemostasis.  The patient left the lab in stable condition.  Dorn Lesches. MD, [facility] 12/08/2020 12:50 PM  Findings Coronary Findings Diagnostic  Dominance: Right  Left Anterior Descending Prox LAD to Mid LAD  lesion is 100% stenosed. Vessel is the culprit lesion. The lesion was not previously treated .  Left Circumflex Prox Cx to Mid Cx lesion is 80% stenosed. Mid Cx lesion is 99% stenosed.  Right Coronary Artery Prox RCA lesion is 30% stenosed.  Intervention  Prox LAD to Mid LAD lesion Stent Lesion crossed with guidewire. Pre-stent angioplasty was performed. A drug-eluting stent was successfully placed using a STENT ONYX FRONTIER 2.5X26. Stent strut is well apposed. Stent does not overlap previously placed stentPost-stent angioplasty was performed. Post-Intervention Lesion Assessment The intervention was successful. Pre-interventional TIMI flow is 0. Post-intervention TIMI flow is 3. No complications occurred at this lesion. There is a 0% residual stenosis post intervention.     ECHOCARDIOGRAM  ECHOCARDIOGRAM COMPLETE 05/12/2024  Narrative ECHOCARDIOGRAM REPORT    Patient Name:   Amber Peterson [facility] Date of Exam: 05/12/2024 Medical Rec #:  [id number]      Height:       58.0 in Accession #:    [id number]     Weight:       122.1 lb Date of Birth:  16-Aug-1955      BSA:          1.477 m Patient Age:    68 years       BP:           147/88 mmHg Patient Gender: F              HR:           78 bpm. Exam Location:  Inpatient  Procedure: 2D Echo, Cardiac Doppler, Color Doppler and Intracardiac Opacification Agent (Both Spectral and Color Flow Doppler were utilized during procedure).  Indications:    Stroke  History:        Patient has prior history of Echocardiogram examinations, most recent 03/24/2021. Cardiomyopathy, CAD; Risk Factors:Hypertension, Diabetes and Dyslipidemia.  Sonographer:    Odella Brewster Referring Phys: [id number] [facility]   Sonographer Comments: Technically difficult study due to poor echo windows. Image acquisition challenging due to respiratory motion. IMPRESSIONS   1. 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 There is possible apical mural thrombus vs swirling coagulum in  the cardiac apex. Recommend cardiac MRI to better assess for possible thrombus. 2. Left ventricular ejection fraction, by estimation, is 60 to 65%. Left ventricular ejection fraction by 2D MOD biplane is 62.5 %. The left ventricle has normal function. The left ventricle demonstrates regional wall motion abnormalities (see scoring diagram/findings for description). Left ventricular diastolic parameters were normal. The left ventricular  apex is aneurymal in appearance. There is also akinesis of the apical anterior wall. 3. Right ventricular systolic function is normal. The right ventricular size is normal. Tricuspid regurgitation signal is inadequate for assessing PA pressure. 4. The mitral valve is normal in structure. No evidence of mitral valve regurgitation. No evidence of mitral stenosis. 5. The aortic valve has an indeterminant number of cusps. Aortic valve regurgitation is not visualized. No aortic stenosis is present. 6. The inferior vena cava is normal in size with greater than 50% respiratory variability, suggesting right atrial pressure of 3 mmHg.  Conclusion(s)/Recommendation(s): Cardiac MRI to rule out apical thrombus.  FINDINGS Left Ventricle: Left ventricular ejection fraction, by estimation, is 60 to 65%. Left ventricular ejection fraction by 2D MOD biplane is 62.5 %. The left ventricle has normal function. The left ventricle demonstrates regional wall motion abnormalities. Definity  contrast agent was given IV to delineate the left ventricular endocardial borders. The left ventricular internal cavity size was normal in size. There is no left ventricular hypertrophy. Left ventricular diastolic parameters were normal.   LV Wall Scoring: The apical lateral segment, apical septal segment, apical anterior segment, and apical inferior segment are aneurysmal. The left ventricular apex is aneurymal in appearance. There is also akinesis of the apical anterior wall.  Right Ventricle: The  right ventricular size is normal. No increase in right ventricular wall thickness. Right ventricular systolic function is normal. Tricuspid regurgitation signal is inadequate for assessing PA pressure.  Left Atrium: Left atrial size was normal in size.  Right Atrium: Right atrial size was normal in size.  Pericardium: There is no evidence of pericardial effusion.  Mitral Valve: The mitral valve is normal in structure. No evidence of mitral valve regurgitation. No evidence of mitral valve stenosis. MV peak gradient, 3.1 mmHg. The mean mitral valve gradient is 2.0 mmHg.  Tricuspid Valve: The tricuspid valve is normal in structure. Tricuspid valve regurgitation is not demonstrated. No evidence of tricuspid stenosis.  Aortic Valve: The aortic valve has an indeterminant number of cusps. Aortic valve regurgitation is not visualized. No aortic stenosis is present. Aortic valve mean gradient measures 3.0 mmHg. Aortic valve peak gradient measures 6.9 mmHg. Aortic valve area, by VTI measures 2.18 cm.  Pulmonic Valve: The pulmonic valve was normal in structure. Pulmonic valve regurgitation is not visualized. No evidence of pulmonic stenosis.  Aorta: The aortic root and ascending aorta are structurally normal, with no evidence of dilitation.  Venous: The inferior vena cava is normal in size with greater than 50% respiratory variability, suggesting right atrial pressure of 3 mmHg.  IAS/Shunts: No atrial level shunt detected by color flow Doppler.   LEFT VENTRICLE PLAX 2D                        Biplane EF (MOD) LVIDd:         4.20 cm         LV Biplane EF:   Left LVIDs:         2.35 cm                          ventricular LV PW:         0.90 cm                          ejection LV IVS:        0.90 cm  fraction by LVOT diam:     1.90 cm                          2D MOD LV SV:         52                               biplane is LV SV Index:   35                                62.5 %. LVOT Area:     2.84 cm LV IVRT:       106 msec        Diastology LV e' medial:    8.05 cm/s LV E/e' medial:  9.8 LV Volumes (MOD)               LV e' lateral:   8.38 cm/s LV vol d, MOD    45.6 ml       LV E/e' lateral: 9.4 A2C: LV vol d, MOD    52.4 ml A4C: LV vol s, MOD    16.7 ml A2C: LV vol s, MOD    20.2 ml A4C: LV SV MOD A2C:   28.9 ml LV SV MOD A4C:   52.4 ml LV SV MOD BP:    30.8 ml  RIGHT VENTRICLE             IVC RV S prime:     12.00 cm/s  IVC diam: 1.40 cm TAPSE (M-mode): 1.9 cm PULMONARY VEINS Diastolic Velocity: 32.50 cm/s S/D Velocity:       1.60 Systolic Velocity:  52.20 cm/s  LEFT ATRIUM             Index LA diam:        2.50 cm 1.69 cm/m LA Vol (A2C):   30.1 ml 20.39 ml/m LA Vol (A4C):   16.0 ml 10.84 ml/m LA Biplane Vol: 22.6 ml 15.31 ml/m AORTIC VALVE                    PULMONIC VALVE AV Area (Vmax):    2.13 cm     PV Vmax:       1.03 m/s AV Area (Vmean):   2.23 cm     PV Peak grad:  4.2 mmHg AV Area (VTI):     2.18 cm AV Vmax:           131.00 cm/s AV Vmean:          83.900 cm/s AV VTI:            0.238 m AV Peak Grad:      6.9 mmHg AV Mean Grad:      3.0 mmHg LVOT Vmax:         98.20 cm/s LVOT Vmean:        65.900 cm/s LVOT VTI:          0.183 m LVOT/AV VTI ratio: 0.77  AORTA Ao Root diam: 2.20 cm  MITRAL VALVE MV Area (PHT): 2.94 cm    SHUNTS MV Area VTI:   2.47 cm    Systemic VTI:  0.18 m MV Peak grad:  3.1 mmHg    Systemic Diam: 1.90 cm MV Mean grad:  2.0 mmHg MV Vmax:       0.88  m/s MV Vmean:      59.9 cm/s MV Decel Time: 258 msec MV E velocity: 78.60 cm/s MV A velocity: 87.00 cm/s MV E/A ratio:  0.90  Georganna Archer Electronically signed by Georganna Archer Signature Date/Time: 05/12/2024/12:22:34 PM    Final        CARDIAC MRI  MR CARDIAC MORPHOLOGY W WO CONTRAST 05/12/2024  Narrative CLINICAL DATA:  89F with CAD, ICM p/w acute CVA. Echo concerning for possible apical thrombus. Anterior STEMI in  11/2020.  EXAM: CARDIAC MRI  TECHNIQUE: The patient was scanned on a 1.5 Tesla Siemens magnet. A dedicated cardiac coil was used. Functional imaging was done using Fiesta sequences. 2,3, and 4 chamber views were done to assess for RWMA's. Modified Simpson's rule using a short axis stack was used to calculate an ejection fraction on a dedicated work [employer]. The patient received 8 cc of Gadavist . After 10 minutes inversion recovery sequences were used to assess for infiltration and scar tissue. Phase contrast velocity mapping was performed above the aortic and pulmonic valves  CONTRAST:  8 cc  of Gadavist   FINDINGS: Left ventricle:  -Small size  -No hypertrophy  -Normal global systolic function.  Apical aneurysm  -Normal ECV (26%)  -Normal T2 values  -Subendocardial LGE consistent with prior infarct in mid anteroseptal, apical anterior/septal walls, and apex. LGE is greater than 50% transmural suggesting nonviability in this region  -No LV thrombus seen  LV EF:  58% (Normal 52-79%)  Absolute volumes:  LV EDV: 70mL (Normal 78-167 mL)  LV ESV: 29mL (Normal 21-64 mL)  LV SV: 40mL (Normal 52-114 mL)  CO: 3.0L/min (Normal 2.7-6.3 L/min)  Indexed volumes:  LV EDV: 29mL/sq-m (Normal 50-96 mL/sq-m)  LV ESV: 22mL/sq-m (Normal 10-40 mL/sq-m)  LV SV: 70mL/sq-m (Normal 33-64 mL/sq-m)  CI: 2.0L/min/sq-m (Normal 1.9-3.9 L/min/sq-m)  Right ventricle: Small size with normal systolic function  RV EF: 68% (Normal 52-80%)  Absolute volumes:  RV EDV: 51mL (Normal 79-175 mL)  RV ESV: 17mL (Normal 13-75 mL)  RV SV: 35mL (Normal 56-110 mL)  CO: 2.6L/min (Normal 2.7-6 L/min)  Indexed volumes:  RV EDV: 78mLsq-m (Normal 51-97 mL/sq-m)  RV ESV: 4mL/sq-m (Normal 9-42 mL/sq-m)  RV SV: 81mL/sq-m (Normal 35-61 mL/sq-m)  CI: 1.7L/min/sq-m (Normal 1.8-3.8 L/min/sq-m)  Left atrium: Normal size  Right atrium: Normal size  Mitral valve: Trivial  regurgitation  Aortic valve: No regurgitation  Tricuspid valve: Trivial regurgitation  Pulmonic valve: No regurgitation  Aorta: Normal proximal ascending aorta  Pericardium: Normal  IMPRESSION: 1.  No LV thrombus seen  2. Subendocardial LGE consistent with prior infarct in mid anteroseptal, apical anterior/septal walls, and apex. LGE is greater than 50% transmural suggesting nonviability in this region  3. Small LV size, no hypertrophy, and normal global systolic function (EF 58%). Apical aneurysm  4.  Small RV size with normal systolic function (EF 68%)   Electronically Signed By: Lonni Nanas M.D. On: 05/12/2024 21:19   ______________________________________________________________________________________________      Risk Assessment/Calculations           Physical Exam VS:  BP 120/70 (BP Location: Left Arm, Patient Position: Sitting, Cuff Size: Normal)   Pulse 65   Ht 4' 10 (1.473 m)   Wt 123 lb (55.8 kg)   SpO2 96%   BMI 25.71 kg/m        Wt Readings from Last 3 Encounters:  05/18/24 123 lb (55.8 kg)  05/11/24 122 lb 1.6 oz (55.4 kg)  04/12/24 126 lb 9.6  oz (57.4 kg)    GEN: Well nourished, well developed in no acute distress NECK: No JVD; No carotid bruits CARDIAC: RRR, no murmurs, rubs, gallops RESPIRATORY:  Clear to auscultation without rales, wheezing or rhonchi  ABDOMEN: Soft, non-tender, non-distended EXTREMITIES:  No edema; No deformity   ASSESSMENT AND PLAN  Recent CVA: Seen by neurology service in the hospital.  I will obtain a 2-week heart monitor to rule out atrial fibrillation.  Initial echocardiogram showed normal EF with either swirling or clot at the apex, subsequent Cardi MRI did not show any clot.  Will defer to Dr. Anner on the next visit to decide if the patient needs a repeat limited echocardiogram with Definity  to look at the apex again.  Ischemic cardiomyopathy: Ejection fraction normalized on the previous  echocardiogram.  On the recent CTA of the head and neck, there is mention of focal severe stenosis versus short segment subtotal occlusion of mid to distal right M1 segment, a radiographic string sign is present, right MCA and its branches remain patent and perfused distally.  Neurology has recommended to keep her systolic blood pressure between 130-150 mmHg given the string sign.  Will discontinue Entresto  for now.  Her spironolactone  was discontinued during the recent hospitalization.  Her metoprolol  succinate was switched to metoprolol  tartrate also during the recent hospitalization.  CAD: Denies any chest pain.  During the recent hospitalization for stroke, she was placed on aspirin  and Brilinta  with recommendation to switch to Plavix  monotherapy after 1 month.  Hypertension: Blood pressure lower than target blood pressure laid out by neurology service.  Will discontinue Entresto  for now.  May restart Entresto  in the future once blood pressure come up.  Hyperlipidemia: On atorvastatin  40 mg daily.          Dispo: Follow-up with Dr. Anner in 2 to 3 months  Signed, Layia Walla, PA  "

## 2024-05-19 NOTE — Transitions of Care (Post Inpatient/ED Visit) (Signed)
 05/16/24  Patient ID: Amber Peterson, female   DOB: 1956-02-25, 69 y.o.   MRN: [id number]  S:  Patient taking meds differently than Discharge summary med list. B: She is taking Farxiga , which she says was prescribed to her a few months ago.  She is taking her Symbicort PRN, yet is complaining of shortness of breath. A: She should be having a PCP visit soon for hosp. F/u and I would like to highlight this med discrepency R:  I would like  PCP to please review with patient at next appointment if and how these meds should be taken.  Left voicemail with Dr. Milford assistant for call back.   Almarie Shams, RN [place] [facility] RN Care Manager / Transition of Care Direct Dial: [phone]

## 2024-05-22 ENCOUNTER — Telehealth

## 2024-05-31 ENCOUNTER — Ambulatory Visit: Admitting: Physical Therapy

## 2024-05-31 ENCOUNTER — Ambulatory Visit: Admitting: Occupational Therapy

## 2024-06-12 ENCOUNTER — Ambulatory Visit: Admitting: Cardiology

## 2024-06-14 ENCOUNTER — Ambulatory Visit: Admitting: Speech Pathology

## 2024-06-14 ENCOUNTER — Ambulatory Visit: Admitting: Occupational Therapy

## 2024-06-14 ENCOUNTER — Ambulatory Visit: Admitting: Physical Therapy

## 2024-06-21 ENCOUNTER — Ambulatory Visit

## 2024-06-21 ENCOUNTER — Ambulatory Visit: Admitting: Occupational Therapy

## 2024-06-28 ENCOUNTER — Ambulatory Visit: Admitting: Speech Pathology

## 2024-06-28 ENCOUNTER — Ambulatory Visit: Admitting: Occupational Therapy

## 2024-06-28 ENCOUNTER — Ambulatory Visit: Admitting: Physical Therapy

## 2024-07-05 ENCOUNTER — Ambulatory Visit: Admitting: Physical Therapy

## 2024-07-05 ENCOUNTER — Ambulatory Visit: Admitting: Speech Pathology

## 2024-07-05 ENCOUNTER — Ambulatory Visit: Admitting: Occupational Therapy

## 2024-08-15 ENCOUNTER — Ambulatory Visit: Admitting: Cardiology
